# Patient Record
Sex: Female | Born: 1985 | Race: Black or African American | Hispanic: No | Marital: Single | Smoking: Former smoker
Health system: Southern US, Community
[De-identification: ages and names within clinical notes are randomized; demographics above are authoritative.]

## PROBLEM LIST (undated history)

## (undated) ENCOUNTER — Emergency Department (HOSPITAL_COMMUNITY): Payer: Medicaid Other

## (undated) ENCOUNTER — Emergency Department (HOSPITAL_COMMUNITY): Discharge status: Absent without leave | Payer: Self-pay

## (undated) DIAGNOSIS — R519 Headache, unspecified: Secondary | ICD-10-CM

## (undated) DIAGNOSIS — Z923 Personal history of irradiation: Secondary | ICD-10-CM

---
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
## 2007-03-09 ENCOUNTER — Emergency Department (HOSPITAL_COMMUNITY): Admission: EM | Admit: 2007-03-09 | Discharge: 2007-03-09 | Payer: Self-pay | Admitting: Family Medicine

## 2007-04-14 ENCOUNTER — Encounter: Payer: Self-pay | Admitting: Family Medicine

## 2007-04-14 ENCOUNTER — Ambulatory Visit: Payer: Self-pay

## 2007-04-14 LAB — CONVERTED CEMR LAB
Antibody Screen: NEGATIVE
Basophils Relative: 1 % (ref 0–1)
Eosinophils Absolute: 0.2 10*3/uL (ref 0.0–1.2)
Hemoglobin: 11.6 g/dL — ABNORMAL LOW (ref 12.0–16.0)
MCHC: 34.1 g/dL (ref 28.0–37.0)
MCV: 87.9 fL (ref 82.0–98.0)
Monocytes Absolute: 0.6 10*3/uL (ref 0.2–1.2)
Monocytes Relative: 13 % — ABNORMAL HIGH (ref 3–10)
RBC: 3.87 M/uL (ref 3.80–5.70)
RDW: 15.4 % — ABNORMAL HIGH (ref 11.4–14.0)

## 2007-04-19 ENCOUNTER — Encounter: Payer: Self-pay | Admitting: Family Medicine

## 2007-04-19 ENCOUNTER — Ambulatory Visit: Payer: Self-pay | Admitting: Sports Medicine

## 2007-05-05 ENCOUNTER — Ambulatory Visit: Payer: Self-pay | Admitting: Family Medicine

## 2007-05-05 ENCOUNTER — Encounter: Payer: Self-pay | Admitting: Family Medicine

## 2007-05-05 LAB — CONVERTED CEMR LAB
Chlamydia, DNA Probe: POSITIVE — AB
Whiff Test: POSITIVE

## 2007-05-07 ENCOUNTER — Telehealth: Payer: Self-pay | Admitting: *Deleted

## 2007-05-12 ENCOUNTER — Telehealth (INDEPENDENT_AMBULATORY_CARE_PROVIDER_SITE_OTHER): Payer: Self-pay | Admitting: Family Medicine

## 2007-05-15 ENCOUNTER — Encounter: Payer: Self-pay | Admitting: Family Medicine

## 2007-05-16 ENCOUNTER — Ambulatory Visit (HOSPITAL_COMMUNITY): Admission: RE | Admit: 2007-05-16 | Discharge: 2007-05-16 | Payer: Self-pay | Admitting: Family Medicine

## 2007-05-29 ENCOUNTER — Encounter: Payer: Self-pay | Admitting: Family Medicine

## 2007-06-02 ENCOUNTER — Encounter: Payer: Self-pay | Admitting: Family Medicine

## 2007-06-02 ENCOUNTER — Encounter: Payer: Self-pay | Admitting: *Deleted

## 2007-06-07 ENCOUNTER — Telehealth (INDEPENDENT_AMBULATORY_CARE_PROVIDER_SITE_OTHER): Payer: Self-pay | Admitting: *Deleted

## 2007-06-07 ENCOUNTER — Encounter: Payer: Self-pay | Admitting: Family Medicine

## 2007-06-07 ENCOUNTER — Ambulatory Visit: Payer: Self-pay | Admitting: Family Medicine

## 2007-06-07 DIAGNOSIS — A5609 Other chlamydial infection of lower genitourinary tract: Secondary | ICD-10-CM | POA: Insufficient documentation

## 2007-06-21 ENCOUNTER — Encounter: Payer: Self-pay | Admitting: *Deleted

## 2007-06-26 ENCOUNTER — Encounter (INDEPENDENT_AMBULATORY_CARE_PROVIDER_SITE_OTHER): Payer: Self-pay | Admitting: *Deleted

## 2007-07-06 ENCOUNTER — Ambulatory Visit: Payer: Self-pay | Admitting: Family Medicine

## 2007-07-18 ENCOUNTER — Ambulatory Visit: Payer: Self-pay | Admitting: Family Medicine

## 2007-07-19 ENCOUNTER — Ambulatory Visit (HOSPITAL_COMMUNITY): Admission: RE | Admit: 2007-07-19 | Discharge: 2007-07-19 | Payer: Self-pay | Admitting: Family Medicine

## 2007-07-19 ENCOUNTER — Encounter: Payer: Self-pay | Admitting: Family Medicine

## 2007-07-24 ENCOUNTER — Encounter: Payer: Self-pay | Admitting: Family Medicine

## 2007-08-01 ENCOUNTER — Ambulatory Visit: Payer: Self-pay | Admitting: Family Medicine

## 2007-08-08 ENCOUNTER — Telehealth (INDEPENDENT_AMBULATORY_CARE_PROVIDER_SITE_OTHER): Payer: Self-pay | Admitting: *Deleted

## 2007-08-11 ENCOUNTER — Encounter: Payer: Self-pay | Admitting: *Deleted

## 2007-08-25 ENCOUNTER — Encounter: Payer: Self-pay | Admitting: *Deleted

## 2007-08-30 ENCOUNTER — Encounter: Payer: Self-pay | Admitting: Family Medicine

## 2007-08-30 ENCOUNTER — Ambulatory Visit: Payer: Self-pay | Admitting: Family Medicine

## 2007-08-31 ENCOUNTER — Encounter: Payer: Self-pay | Admitting: Family Medicine

## 2007-09-01 ENCOUNTER — Telehealth (INDEPENDENT_AMBULATORY_CARE_PROVIDER_SITE_OTHER): Payer: Self-pay | Admitting: *Deleted

## 2007-09-01 ENCOUNTER — Ambulatory Visit: Payer: Self-pay | Admitting: Family Medicine

## 2007-09-06 ENCOUNTER — Ambulatory Visit: Payer: Self-pay | Admitting: Family Medicine

## 2007-09-13 ENCOUNTER — Encounter: Payer: Self-pay | Admitting: *Deleted

## 2007-09-19 ENCOUNTER — Telehealth: Payer: Self-pay | Admitting: Family Medicine

## 2007-09-19 ENCOUNTER — Encounter (INDEPENDENT_AMBULATORY_CARE_PROVIDER_SITE_OTHER): Payer: Self-pay | Admitting: *Deleted

## 2007-09-20 ENCOUNTER — Ambulatory Visit: Payer: Self-pay | Admitting: Family Medicine

## 2007-09-20 ENCOUNTER — Encounter: Payer: Self-pay | Admitting: Family Medicine

## 2007-09-21 ENCOUNTER — Encounter: Payer: Self-pay | Admitting: *Deleted

## 2007-09-26 ENCOUNTER — Inpatient Hospital Stay (HOSPITAL_COMMUNITY): Admission: AD | Admit: 2007-09-26 | Discharge: 2007-09-30 | Payer: Self-pay | Admitting: Obstetrics & Gynecology

## 2007-09-26 ENCOUNTER — Ambulatory Visit: Payer: Self-pay | Admitting: Obstetrics & Gynecology

## 2007-09-26 ENCOUNTER — Encounter: Payer: Self-pay | Admitting: *Deleted

## 2007-09-27 ENCOUNTER — Encounter: Payer: Self-pay | Admitting: Obstetrics & Gynecology

## 2007-11-27 ENCOUNTER — Ambulatory Visit: Payer: Self-pay | Admitting: Sports Medicine

## 2007-11-27 ENCOUNTER — Encounter (INDEPENDENT_AMBULATORY_CARE_PROVIDER_SITE_OTHER): Payer: Self-pay | Admitting: *Deleted

## 2007-11-27 DIAGNOSIS — Z720 Tobacco use: Secondary | ICD-10-CM | POA: Insufficient documentation

## 2007-11-27 DIAGNOSIS — R8789 Other abnormal findings in specimens from female genital organs: Secondary | ICD-10-CM

## 2007-11-27 LAB — CONVERTED CEMR LAB
GC Probe Amp, Genital: NEGATIVE
HCT: 35.5 % — ABNORMAL LOW (ref 36.0–46.0)
Hemoglobin: 11.8 g/dL — ABNORMAL LOW (ref 12.0–15.0)
Platelets: 330 10*3/uL (ref 150–400)
RBC: 4.34 M/uL (ref 3.87–5.11)
WBC: 4.6 10*3/uL (ref 4.0–10.5)

## 2007-12-01 ENCOUNTER — Encounter (INDEPENDENT_AMBULATORY_CARE_PROVIDER_SITE_OTHER): Payer: Self-pay | Admitting: *Deleted

## 2007-12-26 ENCOUNTER — Telehealth (INDEPENDENT_AMBULATORY_CARE_PROVIDER_SITE_OTHER): Payer: Self-pay | Admitting: *Deleted

## 2007-12-27 ENCOUNTER — Encounter: Payer: Self-pay | Admitting: *Deleted

## 2008-02-13 ENCOUNTER — Encounter: Payer: Self-pay | Admitting: Family Medicine

## 2008-03-13 ENCOUNTER — Ambulatory Visit: Payer: Self-pay | Admitting: Family Medicine

## 2008-03-13 ENCOUNTER — Encounter: Payer: Self-pay | Admitting: *Deleted

## 2008-04-08 ENCOUNTER — Encounter (INDEPENDENT_AMBULATORY_CARE_PROVIDER_SITE_OTHER): Payer: Self-pay | Admitting: *Deleted

## 2008-04-10 ENCOUNTER — Encounter: Payer: Self-pay | Admitting: Family Medicine

## 2008-05-02 ENCOUNTER — Encounter: Payer: Self-pay | Admitting: *Deleted

## 2008-05-20 ENCOUNTER — Telehealth: Payer: Self-pay | Admitting: *Deleted

## 2008-05-24 ENCOUNTER — Ambulatory Visit: Payer: Self-pay | Admitting: Family Medicine

## 2008-05-24 ENCOUNTER — Encounter: Payer: Self-pay | Admitting: Family Medicine

## 2008-05-24 LAB — CONVERTED CEMR LAB: Whiff Test: NEGATIVE

## 2008-05-27 ENCOUNTER — Encounter: Payer: Self-pay | Admitting: Family Medicine

## 2008-05-28 ENCOUNTER — Encounter: Payer: Self-pay | Admitting: Family Medicine

## 2008-05-31 ENCOUNTER — Telehealth: Payer: Self-pay | Admitting: *Deleted

## 2008-06-03 ENCOUNTER — Ambulatory Visit: Payer: Self-pay | Admitting: Family Medicine

## 2008-06-17 ENCOUNTER — Emergency Department (HOSPITAL_COMMUNITY): Admission: EM | Admit: 2008-06-17 | Discharge: 2008-06-18 | Payer: Self-pay | Admitting: Emergency Medicine

## 2008-08-05 ENCOUNTER — Encounter: Payer: Self-pay | Admitting: *Deleted

## 2008-08-06 ENCOUNTER — Ambulatory Visit: Payer: Self-pay | Admitting: Family Medicine

## 2008-08-06 ENCOUNTER — Encounter (INDEPENDENT_AMBULATORY_CARE_PROVIDER_SITE_OTHER): Payer: Self-pay | Admitting: Family Medicine

## 2008-08-06 LAB — CONVERTED CEMR LAB
Blood in Urine, dipstick: NEGATIVE
Glucose, Urine, Semiquant: NEGATIVE
Protein, U semiquant: NEGATIVE
pH: 6

## 2008-08-12 ENCOUNTER — Encounter (INDEPENDENT_AMBULATORY_CARE_PROVIDER_SITE_OTHER): Payer: Self-pay | Admitting: *Deleted

## 2008-08-12 LAB — CONVERTED CEMR LAB

## 2008-08-20 ENCOUNTER — Ambulatory Visit: Payer: Self-pay | Admitting: Family Medicine

## 2008-12-31 ENCOUNTER — Ambulatory Visit: Payer: Self-pay | Admitting: Family Medicine

## 2008-12-31 ENCOUNTER — Encounter: Payer: Self-pay | Admitting: Family Medicine

## 2008-12-31 LAB — CONVERTED CEMR LAB: Beta hcg, urine, semiquantitative: NEGATIVE

## 2009-01-01 ENCOUNTER — Encounter (INDEPENDENT_AMBULATORY_CARE_PROVIDER_SITE_OTHER): Payer: Self-pay | Admitting: *Deleted

## 2009-01-01 LAB — CONVERTED CEMR LAB: Chlamydia, DNA Probe: NEGATIVE

## 2009-01-03 ENCOUNTER — Ambulatory Visit: Payer: Self-pay | Admitting: Family Medicine

## 2009-01-03 DIAGNOSIS — A5400 Gonococcal infection of lower genitourinary tract, unspecified: Secondary | ICD-10-CM | POA: Insufficient documentation

## 2009-02-24 ENCOUNTER — Telehealth: Payer: Self-pay | Admitting: Family Medicine

## 2009-02-25 ENCOUNTER — Encounter: Payer: Self-pay | Admitting: Family Medicine

## 2009-02-25 ENCOUNTER — Encounter: Payer: Self-pay | Admitting: *Deleted

## 2009-02-25 ENCOUNTER — Ambulatory Visit: Payer: Self-pay | Admitting: Family Medicine

## 2009-02-25 LAB — CONVERTED CEMR LAB
Beta hcg, urine, semiquantitative: POSITIVE
Lymphocytes Relative: 32 % (ref 12–46)
Lymphs Abs: 1.5 10*3/uL (ref 0.7–4.0)
Neutro Abs: 2.7 10*3/uL (ref 1.7–7.7)
Neutrophils Relative %: 58 % (ref 43–77)
Platelets: 240 10*3/uL (ref 150–400)
Rubella: 63.7 intl units/mL — ABNORMAL HIGH
WBC: 4.7 10*3/uL (ref 4.0–10.5)

## 2009-02-26 ENCOUNTER — Encounter: Payer: Self-pay | Admitting: Family Medicine

## 2009-02-27 ENCOUNTER — Encounter: Payer: Self-pay | Admitting: Family Medicine

## 2009-02-27 ENCOUNTER — Ambulatory Visit (HOSPITAL_COMMUNITY): Admission: RE | Admit: 2009-02-27 | Discharge: 2009-02-27 | Payer: Self-pay | Admitting: Family Medicine

## 2009-03-03 ENCOUNTER — Encounter: Payer: Self-pay | Admitting: Family Medicine

## 2009-03-03 ENCOUNTER — Ambulatory Visit: Payer: Self-pay | Admitting: Family Medicine

## 2009-03-03 ENCOUNTER — Telehealth: Payer: Self-pay | Admitting: *Deleted

## 2009-03-13 ENCOUNTER — Telehealth: Payer: Self-pay | Admitting: Family Medicine

## 2009-03-13 ENCOUNTER — Encounter: Payer: Self-pay | Admitting: Family Medicine

## 2009-03-13 ENCOUNTER — Ambulatory Visit: Payer: Self-pay | Admitting: Family Medicine

## 2009-04-02 ENCOUNTER — Inpatient Hospital Stay (HOSPITAL_COMMUNITY): Admission: AD | Admit: 2009-04-02 | Discharge: 2009-04-02 | Payer: Self-pay | Admitting: Obstetrics and Gynecology

## 2009-04-09 ENCOUNTER — Encounter: Payer: Self-pay | Admitting: Family Medicine

## 2009-04-14 ENCOUNTER — Encounter: Payer: Self-pay | Admitting: Family Medicine

## 2009-04-14 ENCOUNTER — Ambulatory Visit: Payer: Self-pay | Admitting: Family Medicine

## 2009-04-18 LAB — CONVERTED CEMR LAB
Chlamydia, DNA Probe: NEGATIVE
GC Probe Amp, Genital: NEGATIVE

## 2009-04-24 ENCOUNTER — Inpatient Hospital Stay (HOSPITAL_COMMUNITY): Admission: AD | Admit: 2009-04-24 | Discharge: 2009-04-27 | Payer: Self-pay | Admitting: Obstetrics & Gynecology

## 2009-04-24 ENCOUNTER — Ambulatory Visit: Payer: Self-pay | Admitting: Obstetrics & Gynecology

## 2009-06-13 ENCOUNTER — Encounter: Payer: Self-pay | Admitting: Family Medicine

## 2009-06-13 ENCOUNTER — Ambulatory Visit: Payer: Self-pay | Admitting: Family Medicine

## 2009-06-13 ENCOUNTER — Encounter: Payer: Self-pay | Admitting: *Deleted

## 2009-06-13 DIAGNOSIS — N898 Other specified noninflammatory disorders of vagina: Secondary | ICD-10-CM | POA: Insufficient documentation

## 2009-06-18 LAB — CONVERTED CEMR LAB: GC Probe Amp, Genital: NEGATIVE

## 2009-07-15 ENCOUNTER — Ambulatory Visit: Payer: Self-pay | Admitting: Family Medicine

## 2009-10-22 ENCOUNTER — Ambulatory Visit: Payer: Self-pay | Admitting: Family Medicine

## 2009-10-22 LAB — CONVERTED CEMR LAB: Beta hcg, urine, semiquantitative: NEGATIVE

## 2009-10-26 ENCOUNTER — Emergency Department (HOSPITAL_COMMUNITY): Admission: EM | Admit: 2009-10-26 | Discharge: 2009-10-26 | Payer: Self-pay | Admitting: Emergency Medicine

## 2010-05-27 ENCOUNTER — Telehealth: Payer: Self-pay | Admitting: Family Medicine

## 2010-08-03 ENCOUNTER — Encounter: Payer: Self-pay | Admitting: Family Medicine

## 2010-08-03 ENCOUNTER — Encounter: Payer: Self-pay | Admitting: *Deleted

## 2010-11-09 ENCOUNTER — Ambulatory Visit: Admit: 2010-11-09 | Payer: Self-pay

## 2010-11-15 LAB — CONVERTED CEMR LAB: GC Probe Amp, Genital: POSITIVE — AB

## 2010-11-17 NOTE — Assessment & Plan Note (Signed)
Summary: pregnancy test/depo,tcb  Nurse Visit   Allergies: No Known Drug Allergies Laboratory Results   Urine Tests  Date/Time Received: October 22, 2009 9:36 AM  Date/Time Reported: October 22, 2009 9:40 AM     Urine HCG: negative Comments: ...........test performed by...........Marland KitchenTerese Door, CMA      Medication Administration  Injection # 1:    Medication: Depo-Provera 150mg     Diagnosis: CONTRACEPTIVE MANAGEMENT (ICD-V25.09)    Route: IM    Site: L thigh    Exp Date: 11/2010    Lot #: [id number]    Mfr: Pharmacia    Comments: next Depo due March 23 thru April 6 , 2011    Patient tolerated injection without complications    Given by: Theresia Lo RN (October 22, 2009 10:15 AM)  Orders Added: 1)  U Preg-[facility] [81025] 2)  Depo-Provera 150mg  [J1055] 3)  Admin of Injection (IM/SQ) [04540]    Medication Administration  Injection # 1:    Medication: Depo-Provera 150mg     Diagnosis: CONTRACEPTIVE MANAGEMENT (ICD-V25.09)    Route: IM    Site: L thigh    Exp Date: 11/2010    Lot #: [id number]    Mfr: Pharmacia    Comments: next Depo due March 23 thru April 6 , 2011    Patient tolerated injection without complications    Given by: Theresia Lo RN (October 22, 2009 10:15 AM)  Orders Added: 1)  U Preg-[facility] [81025] 2)  Depo-Provera 150mg  [J1055] 3)  Admin of Injection (IM/SQ) [47829]

## 2010-11-17 NOTE — Letter (Signed)
Summary: Probation Letter  [facility]  [address]   [place], [place] [address]   Phone: [phone]  Fax: [phone]    08/03/2010  Dexter L Baltes [address] [place], [place]  [address]  Dear Ms. Motl,  With the goal of better serving all our patients [facility] is following each patient's missed appointments.  You have missed at least 3 appointments with our practice.If you cannot keep your appointment, we expect you to call at least 24 hours before your appointment time.  Missing appointments prevents other patients from seeing us and makes it difficult to provide you with the best possible medical care.      1.   If you miss one more appointment, we will only give you limited medical services. This means we will not call in medication refills, complete a form, or make a referral for you except when you are here for a scheduled office visit.    2.   If you miss 2 or more appointments in the next year, we will dismiss you from our practice.    Our office staff can be reached at [phone] Monday through Friday from 8:30 a.m.-5:00 p.m. and will be glad to schedule your appointment as necessary.    Thank you.   [facility] 

## 2010-11-17 NOTE — Progress Notes (Signed)
Summary: resch  Phone Note Call from Patient   Caller: Patient Summary of Call: just found she had to work this afternoon and had to resch Initial call taken by: De Nurse,  May 27, 2010 12:21 PM

## 2010-11-17 NOTE — Letter (Signed)
Summary: Probation Letter  [facility]  [address]   [place], [place] [address]   Phone: [phone]  Fax: [phone]    08/03/2010  Virginia Cardenas [address] [place], [place]  [address]  Dear [name],  With the goal of better serving all our patients the [facility] is following each patient's missed appointments.  You have missed at least 3 appointments with our practice.If you cannot keep your appointment, we expect you to call at least 24 hours before your appointment time.  Missing appointments prevents other patients from seeing [place] and makes it difficult to provide you with the best possible medical care.      1.   If you miss one more appointment, we will only give you limited medical services. This means we will not call in medication refills, complete a form, or make a referral for you except when you are here for a scheduled office visit.    2.   If you miss 2 or more appointments in the next year, we will dismiss you from our practice.    Our office staff can be reached at [phone] Monday through Friday from 8:30 a.m.-5:00 p.m. and will be glad to schedule your appointment as necessary.    Thank you.   [facility]

## 2011-01-24 LAB — RAPID URINE DRUG SCREEN, HOSP PERFORMED
Amphetamines: NOT DETECTED
Barbiturates: NOT DETECTED
Tetrahydrocannabinol: NOT DETECTED

## 2011-01-24 LAB — CBC
HCT: 29.3 % — ABNORMAL LOW (ref 36.0–46.0)
Hemoglobin: 10.7 g/dL — ABNORMAL LOW (ref 12.0–15.0)
MCV: 84.5 fL (ref 78.0–100.0)
Platelets: 210 10*3/uL (ref 150–400)
RBC: 3.78 MIL/uL — ABNORMAL LOW (ref 3.87–5.11)
RDW: 17.6 % — ABNORMAL HIGH (ref 11.5–15.5)
WBC: 6 10*3/uL (ref 4.0–10.5)
WBC: 8.3 10*3/uL (ref 4.0–10.5)

## 2011-01-24 LAB — RPR: RPR Ser Ql: NONREACTIVE

## 2011-01-26 LAB — POCT URINALYSIS DIP (DEVICE)
Glucose, UA: NEGATIVE mg/dL
Ketones, ur: NEGATIVE mg/dL
Protein, ur: 30 mg/dL — AB
Specific Gravity, Urine: 1.015 (ref 1.005–1.030)
Urobilinogen, UA: 0.2 mg/dL (ref 0.0–1.0)

## 2011-02-09 ENCOUNTER — Ambulatory Visit (INDEPENDENT_AMBULATORY_CARE_PROVIDER_SITE_OTHER): Payer: Self-pay | Admitting: Family Medicine

## 2011-02-09 VITALS — BP 107/70 | HR 70 | Temp 98.2°F | Ht 65.0 in | Wt 174.0 lb

## 2011-02-09 DIAGNOSIS — B86 Scabies: Secondary | ICD-10-CM | POA: Insufficient documentation

## 2011-02-09 DIAGNOSIS — R21 Rash and other nonspecific skin eruption: Secondary | ICD-10-CM

## 2011-02-09 MED ORDER — PERMETHRIN 5 % EX CREA: TOPICAL_CREAM | CUTANEOUS | Status: AC

## 2011-02-09 MED ORDER — HYDROCORTISONE 2.5 % EX CREA: TOPICAL_CREAM | CUTANEOUS | Status: AC

## 2011-02-09 NOTE — Assessment & Plan Note (Addendum)
Consistent with scabies given timeline and distribution/morphology of rash. Overall case discussed with Dr. Wallene Huh as pt's son also being seen for rash. Will treat pt as well as family with permethrin. Scabies handout given to pt. Stressed linen cleaning and d/c co-sleeping.  Will also cover for eczema with topical hydrocortisone. Pt to follow up with PCP in 1-2 weeks for follow up.

## 2011-02-09 NOTE — Progress Notes (Signed)
  Subjective:    Patient ID: Virginia Cardenas, female    DOB: [birth year], 25 y.o.   MRN: [id number]  HPI Pt Upper body rash x 2 days. Pt reports onset of rash after son and daughter spent weekend with grandmother. During that time son and daughter developed similar rash at relatively same onset per pt. Mom reports son seen in ED and treated for unspecified infection. Rash developed on pt after son and daughter came home. Pt and children sleep in same bed. Rash initially on hands, then progressed to forearm, chest upper torso. Rash itchy in nature. No fevers, HA, nausea, vomiting. No recent detergent changes. No noew medications per pt. Past history of eczema per pt. Distribution somewhat atypical from previous presentation.    Review of Systems See HPI    Objective:   Physical Exam  Skin:      Gen- in chair, NAD Skin- + papular rash/burrow on volar surface of hands, forearms bilaterally. + burrows along bra line and ant/post neck.    Assessment & Plan:  Rash- Consistent with scabies given timeline and distribution/morphology of rash. Overall case discussed with Dr. Wallene Huh as pt's son also being seen for rash. Will treat pt as well as family with permethrin. Scabies handout given to pt. Stressed linen cleaning and d/c co-sleeping.  Will also cover for eczema with topical hydrocortisone. Pt to follow up with PCP in 1-2 weeks for follow up.

## 2011-02-09 NOTE — Patient Instructions (Signed)
Scabies   Scabies are small bugs (mites) that burrow under the skin and cause red bumps and severe itching. These bugs can only be seen with a microscope.   Scabies are highly contagious. They can spread easily from person to person by direct contact. They are also spread through sharing clothing or linens that have the scabies mites living in them. It is not unusual for an entire family to become infected through shared towels, clothing, or bedding.   HOME CARE INSTRUCTIONS   Your caregiver may prescribe a cream or lotion to kill the mites. If this cream is prescribed; massage the cream into the entire area of the body from the neck to the bottom of both feet. Also massage the cream into the scalp and face if your child is less than 1 year old. Avoid the eyes and mouth.   Leave the cream on for 8 to12 hours. DO NOT wash your hands after application. Your child should bathe or shower after the 8 to 12 hour application period. Sometimes it is helpful to apply the cream to your child at right before bedtime.   One treatment is usually effective and will eliminate approximately 95% of infestations. For severe cases, your caregiver may decide to repeat the treatment in 1 week. Everyone in your household should be treated with one application of the cream.   New rashes or burrows should not appear after successful treatment within 24 to 48 hours; however the itching and rash may last for 2 to 4 weeks after successful treatment. If your symptoms persist longer than this, see your caregiver.   Your caregiver also may prescribe a medication to help with the itching or to help the rash go away more quickly.   Scabies can live on clothing or linens for up to 3 days. Your entire child’s recently used clothing, towels, stuffed toys, and bed linens should be washed in hot water and then dried in a dryer for at least 20 minutes on high heat. Items that cannot be washed should be enclosed in a plastic bag for at least 3 days.   To  help relieve itching, bathe your child in a COOL bath or apply cool washcloths to the affected areas.   Your child may return to school after treatment with the prescribed cream.   SEEK MEDICAL CARE IF:   The itching persists longer than 4 weeks after treatment.   The rash spreads or becomes infected (the area has red blisters or yellow-tan crust).   Document Released: 10/04/2005 Document Re-Released: 02/20/2009   Exit[employer]® Patient Information ©2011 [employer].

## 2011-03-02 NOTE — Op Note (Signed)
Virginia Cardenas, Virginia Cardenas            ACCOUNT NO.:  [id number]   MEDICAL RECORD NO.:  [id number]          PATIENT TYPE:  INP   LOCATION:                                [facility]:  [facility]   PHYSICIAN:  Lesly Dukes, M.D. DATE OF BIRTH:  [birth year]   DATE OF PROCEDURE:  09/27/2007  DATE OF DISCHARGE:                               OPERATIVE REPORT   PREOPERATIVE DIAGNOSIS:  1. Intrauterine pregnancy at 40 weeks 1 day gestation.  2. Nonreassuring fetal heart tones.  3. Oligohydramnios.  4. Occipit posterior position.   POSTOPERATIVE DIAGNOSIS:  1. Intrauterine pregnancy at 40 weeks 1 day gestation.  2. Nonreassuring fetal heart tones.  3. Oligohydramnios.  4. Occipit posterior position.   PROCEDURE:  Primary low transverse cesarean section via Pfannenstiel  skin incision.   SURGEON:  Lesly Dukes, M.D.   ASSISTANT:  Karlton Lemon, M.D.   ANESTHESIA:  Epidural.   FINDINGS:  1. Viable infant female weighing 5 pounds 15 ounces with Apgars 8 at      one minute and 9 at five minutes, and cord pH of 7.35.  Nuchal cord      x1 in occipitoposterior position.  2. Clear amniotic fluid.  3. Normal female pelvic anatomy with decidual changes of pregnancy.   ESTIMATED BLOOD LOSS:  700 mL.   DRAINS:  Foley with 125 mL clear yellow urine.   COMPLICATIONS:  None immediate.   SPECIMENS:  Placenta to pathology, cord gas to the lab.   INDICATIONS FOR PROCEDURE:  This is a 25 year old gravida 1 at 40 weeks  1 day gestation with induction of labor for oligohydramnios with an AFI  of 5.4.  The patient was induced initially with Cervidil and then a  Foley bulb was placed and Pitocin was started.  The patient progressed  to anterior lip at which time the fetal heart tones dropped to the 80s  and remained there for approximately 4-5 minutes.  At this time it was  determined that the baby would not tolerate labor and the decision was  made to go for a stat primary low transverse cesarean  section.   DESCRIPTION OF PROCEDURE:  The patient was taken to the operating room  and was prepped and draped in the usual sterile fashion in the supine  position with left lateral uterine displacement.  After ensuring  adequate epidural anesthesia, a Pfannenstiel skin incision was made  using the scalpel.  The incision was carried down through subcutaneous  tissues using the scalpel.  The rectus fascia was nicked in the midline  and the incision was extended laterally in each direction using the Mayo  scissors.  The rectus muscle was dissected free of the fascia using both  sharp and blunt dissection.  The rectus muscles were separated bluntly.  The parietoperitoneum was identified, grasped with a Hemostat, elevated,  and entered inferiorly under direct visualization with blunt dissection.  The peritoneum was then dissected with Metzenbaum scissors to allow  adequate room for delivery.  The bladder blade was then placed.  Low  transverse uterine incision was made using the scalpel and the  incision  was extended laterally and superior using blunt dissection.  The infant  was found to be in the occipitoposterior position with deflexed head.  The hand was placed within the uterine cavity and used to elevate and  flex the head of the infant which was delivered without difficulty.  The  infant was bulb suctioned after delivery of the head.  The body of the  infant was then delivered without difficulty.  The cord was doubly  clamped and cut and the infant handed to the nursery team in attendance.  Specimens were collected for cord gas.  The placenta was delivered  manually and appeared intact.  The endometrial cavity was then wiped  free of any traces of membranes using wet laparotomy sponges.  The edges  of the uterine incision were grasped using Allis clamps and the uterine  incision was closed in one layer with a running locked stitch of 0  Vicryl.  One small area of bleeding was controlled  using a figure-of-  eight stitch of 0 Vicryl.  The uterus was inspected and found to have  adequate hemostasis.  The abdominal cavity and operative site were then  irrigated with normal saline.  The incision was again inspected after  irrigation and found to be hemostatic.  The rectus muscles were  inspected and small areas of bleeding controlled using the Bovie  cautery.  The fascia was reapproximated with one suture of 0 Vicryl in a  running unlocked fashion.  Subcutaneous tissue was then irrigated with a  wet laparotomy sponge.  The skin was then reapproximated with stainless  steel skin staples.  Needle, sponge, and instrument counts correct x2.  The patient tolerated the procedure well and went to the postanesthesia  care unit in stable condition.      Karlton Lemon, MD  Electronically Signed     ______________________________  Lesly Dukes, M.D.    NS/MEDQ  D:  09/27/2007  T:  09/28/2007  Job:  [id number]

## 2011-03-02 NOTE — Discharge Summary (Signed)
Virginia Cardenas, Virginia Cardenas            ACCOUNT NO.:  [id number]   MEDICAL RECORD NO.:  [id number]          PATIENT TYPE:  INP   LOCATION:  9104                          FACILITY:  [facility]   PHYSICIAN:  Lesly Dukes, M.D. DATE OF BIRTH:  December 03, 1985   DATE OF ADMISSION:  09/26/2007  DATE OF DISCHARGE:  09/30/2007                               DISCHARGE SUMMARY   DISCHARGE DIAGNOSIS:  Term pregnancy delivered via low transverse  cesarean section.   DISCHARGE MEDICATIONS:  1. Prenatal vitamins one p.o. daily.  2. Iron 325 mg p.o. b.i.d.  3. Colace 100 mg p.o. daily.  4. Percocet 5/325 mg one p.o. q.4 hours p.r.n. pain.   HOSPITAL COURSE:  The patient is a 25 year old G1, P0 who was admitted  on September 26, 2007 for induction of labor due to decreased amniotic  fluid found in clinic. Patient was admitted for induction of labor and  Cervidil was placed.  Following her Cervidil, patient had a Foley bulb  placed in order to dilate her cervix. Once the Foley bulb fell out it  started to augment labor.  Patient had artificial rupture of membranes  at 9:55 on September 27, 2007 at which time an IUPC and fetal scalp  electrode was placed.  Following AROM the patient had early  decelerations and an amnio-infusion was started. Subsequently patient  had fetal bradycardia to the 80's and 90's for 6 minutes.  She was taken  for stat primary cesarean section due to non reassuring fetal heart  tones.  The patient was consented and taken to the operating room where  the procedure was performed.  Please see operative report for details.   Postpartum patient had no complications.  Staples were removed on  postoperative day 3 prior to discharge and patient received Depo for  contraception prior to going home.  Patient delivered a viable, healthy  female infant with Agar's of 8 and 9.   Patient with follow up at [facility] in 6 weeks.  She  was discharged home in stable condition.      Neena Rhymes, M.D.      Lesly Dukes, M.D.     KT/MEDQ  D:  09/30/2007  T:  10/01/2007  Job:  [id number]

## 2011-06-01 ENCOUNTER — Emergency Department (HOSPITAL_COMMUNITY): Payer: Self-pay

## 2011-06-01 ENCOUNTER — Encounter (HOSPITAL_COMMUNITY): Payer: Self-pay | Admitting: Radiology

## 2011-06-01 ENCOUNTER — Emergency Department (HOSPITAL_COMMUNITY)
Admission: EM | Admit: 2011-06-01 | Discharge: 2011-06-01 | Disposition: A | Discharge status: Home / Self Care | Payer: No Typology Code available for payment source | Attending: Emergency Medicine | Admitting: Emergency Medicine

## 2011-06-01 DIAGNOSIS — R109 Unspecified abdominal pain: Secondary | ICD-10-CM | POA: Insufficient documentation

## 2011-06-01 DIAGNOSIS — S139XXA Sprain of joints and ligaments of unspecified parts of neck, initial encounter: Secondary | ICD-10-CM | POA: Insufficient documentation

## 2011-06-01 DIAGNOSIS — S0990XA Unspecified injury of head, initial encounter: Secondary | ICD-10-CM | POA: Insufficient documentation

## 2011-06-01 DIAGNOSIS — R079 Chest pain, unspecified: Secondary | ICD-10-CM | POA: Insufficient documentation

## 2011-06-01 DIAGNOSIS — S301XXA Contusion of abdominal wall, initial encounter: Secondary | ICD-10-CM | POA: Insufficient documentation

## 2011-06-01 DIAGNOSIS — R10812 Left upper quadrant abdominal tenderness: Secondary | ICD-10-CM | POA: Insufficient documentation

## 2011-06-01 DIAGNOSIS — R51 Headache: Secondary | ICD-10-CM | POA: Insufficient documentation

## 2011-06-01 DIAGNOSIS — M542 Cervicalgia: Secondary | ICD-10-CM | POA: Insufficient documentation

## 2011-06-01 LAB — DIFFERENTIAL
Basophils Absolute: 0 10*3/uL (ref 0.0–0.1)
Eosinophils Absolute: 0.1 10*3/uL (ref 0.0–0.7)
Lymphocytes Relative: 40 % (ref 12–46)
Monocytes Relative: 10 % (ref 3–12)
Neutrophils Relative %: 47 % (ref 43–77)

## 2011-06-01 LAB — CBC
Hemoglobin: 12.9 g/dL (ref 12.0–15.0)
Platelets: ADEQUATE 10*3/uL (ref 150–400)
RBC: 4.31 MIL/uL (ref 3.87–5.11)
WBC: 4.5 10*3/uL (ref 4.0–10.5)

## 2011-06-01 LAB — POCT I-STAT, CHEM 8
Hemoglobin: 13.6 g/dL (ref 12.0–15.0)
Potassium: 4 mEq/L (ref 3.5–5.1)
Sodium: 140 mEq/L (ref 135–145)
TCO2: 26 mmol/L (ref 0–100)

## 2011-06-01 MED ORDER — IOHEXOL 300 MG/ML  SOLN
100.0000 mL | Freq: Once | INTRAMUSCULAR | Status: AC | PRN
  Administered 2011-06-01: 100 mL via INTRAVENOUS

## 2011-06-03 ENCOUNTER — Emergency Department (HOSPITAL_COMMUNITY)
Admission: EM | Admit: 2011-06-03 | Discharge: 2011-06-03 | Disposition: A | Discharge status: Home / Self Care | Payer: No Typology Code available for payment source | Attending: Emergency Medicine | Admitting: Emergency Medicine

## 2011-06-03 DIAGNOSIS — M546 Pain in thoracic spine: Secondary | ICD-10-CM | POA: Insufficient documentation

## 2011-06-03 DIAGNOSIS — M545 Low back pain, unspecified: Secondary | ICD-10-CM | POA: Insufficient documentation

## 2011-06-03 DIAGNOSIS — R51 Headache: Secondary | ICD-10-CM | POA: Insufficient documentation

## 2011-06-03 DIAGNOSIS — S139XXA Sprain of joints and ligaments of unspecified parts of neck, initial encounter: Secondary | ICD-10-CM | POA: Insufficient documentation

## 2011-06-04 ENCOUNTER — Ambulatory Visit: Payer: No Typology Code available for payment source | Admitting: Family Medicine

## 2011-07-21 LAB — HEPATIC FUNCTION PANEL
ALT: 20
AST: 26
Albumin: 3.9
Total Protein: 7.9

## 2011-07-26 LAB — CBC
HCT: 26.9 — ABNORMAL LOW
HCT: 34.9 — ABNORMAL LOW
Hemoglobin: 11.9 — ABNORMAL LOW
MCHC: 34
MCHC: 34.5
MCV: 87.4
MCV: 87.4
Platelets: 222
RBC: 4
WBC: 12.3 — ABNORMAL HIGH

## 2011-07-29 ENCOUNTER — Ambulatory Visit: Payer: No Typology Code available for payment source | Admitting: Family Medicine

## 2011-09-15 ENCOUNTER — Encounter: Payer: Self-pay | Admitting: Family Medicine

## 2011-09-15 ENCOUNTER — Ambulatory Visit (INDEPENDENT_AMBULATORY_CARE_PROVIDER_SITE_OTHER): Payer: No Typology Code available for payment source | Admitting: Family Medicine

## 2011-09-15 DIAGNOSIS — M545 Low back pain, unspecified: Secondary | ICD-10-CM | POA: Insufficient documentation

## 2011-09-15 DIAGNOSIS — Z309 Encounter for contraceptive management, unspecified: Secondary | ICD-10-CM | POA: Insufficient documentation

## 2011-09-15 DIAGNOSIS — N898 Other specified noninflammatory disorders of vagina: Secondary | ICD-10-CM

## 2011-09-15 LAB — POCT WET PREP (WET MOUNT): WBC, Wet Prep HPF POC: >20

## 2011-09-15 MED ORDER — AZITHROMYCIN 500 MG PO TABS: 1000.0000 mg | ORAL_TABLET | Freq: Once | ORAL | Status: DC

## 2011-09-15 MED ORDER — INDOMETHACIN ER 75 MG PO CPCR: 75.0000 mg | ORAL_CAPSULE | Freq: Two times a day (BID) | ORAL | Status: AC

## 2011-09-15 MED ORDER — CEFTRIAXONE SODIUM 1 G IJ SOLR
250.0000 mg | Freq: Once | INTRAMUSCULAR | Status: AC
  Administered 2011-09-15: 250 mg via INTRAMUSCULAR

## 2011-09-15 MED ORDER — BACLOFEN 10 MG PO TABS: 10.0000 mg | ORAL_TABLET | Freq: Three times a day (TID) | ORAL | Status: DC

## 2011-09-15 NOTE — Assessment & Plan Note (Signed)
Musculoskeletal in origin. Advised patient to follow chiropractic or with an osteopathic physician here. Indomethacin and baclofen as needed - which were prescribed today. Heat/ice as needed.
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
## 2011-09-15 NOTE — Assessment & Plan Note (Signed)
Due to high risk we'll treat with azithromycin and ceftriaxone. GC and Chlamydia cultures were obtained as well as wet prep.

## 2011-09-15 NOTE — Patient Instructions (Signed)
Take Azithromycin 1000mg  (2 tablets) today. We will call you with the results of the tests done today.   Back Pain, Adult Back pain is very common. The pain often gets better over time. The cause of back pain is usually not dangerous. Most people can learn to manage their back pain on their own.  HOME CARE   Stay active. Start with short walks on flat ground if you can. Try to walk farther each day.   Do not sit, drive, or stand in one place for more than 30 minutes. Do not stay in bed.   Do not avoid exercise or work. Activity can help your back heal faster.   Be careful when you bend or lift an object. Bend at your knees, keep the object close to you, and do not twist.   Sleep on a firm mattress. Lie on your side, and bend your knees. If you lie on your back, put a pillow under your knees.   Only take medicines as told by your doctor.   Put ice on the injured area.   Put ice in a plastic bag.   Place a towel between your skin and the bag.   Leave the ice on for 15 to 20 minutes, 3 to 4 times a day for the first 2 to 3 days. After that, you can switch between ice and heat packs.   Ask your doctor about back exercises or massage.   Avoid feeling anxious or stressed. Find good ways to deal with stress, such as exercise.  GET HELP RIGHT AWAY IF:   Your pain does not go away with rest or medicine.   Your pain does not go away in 1 week.   You have new problems.   You do not feel well.   The pain spreads into your legs.   You cannot control when you poop (bowel movement) or pee (urinate).   Your arms or legs feel weak or lose feeling (numbness).   You feel sick to your stomach (nauseous) or throw up (vomit).   You have belly (abdominal) pain.   You feel like you may pass out (faint).  MAKE SURE YOU:   Understand these instructions.   Will watch your condition.   Will get help right away if you are not doing well or get worse.  Document Released: 03/22/2008  Document Revised: 06/16/2011 Document Reviewed: 02/22/2011 [facility] Patient Information 2012 [place], [place].

## 2011-09-16 ENCOUNTER — Other Ambulatory Visit: Payer: Self-pay | Admitting: Family Medicine

## 2011-09-16 DIAGNOSIS — N760 Acute vaginitis: Secondary | ICD-10-CM

## 2011-09-16 DIAGNOSIS — B373 Candidiasis of vulva and vagina: Secondary | ICD-10-CM

## 2011-09-16 MED ORDER — METRONIDAZOLE 500 MG PO TABS: 500.0000 mg | ORAL_TABLET | Freq: Two times a day (BID) | ORAL | Status: AC

## 2011-09-16 MED ORDER — FLUCONAZOLE 150 MG PO TABS: 150.0000 mg | ORAL_TABLET | Freq: Once | ORAL | Status: AC

## 2012-01-06 ENCOUNTER — Ambulatory Visit: Payer: No Typology Code available for payment source | Admitting: Family Medicine

## 2012-01-13 ENCOUNTER — Ambulatory Visit: Payer: No Typology Code available for payment source | Admitting: Family Medicine

## 2012-04-07 ENCOUNTER — Ambulatory Visit (INDEPENDENT_AMBULATORY_CARE_PROVIDER_SITE_OTHER): Payer: Medicaid Other | Admitting: Family Medicine

## 2012-04-07 ENCOUNTER — Other Ambulatory Visit (HOSPITAL_COMMUNITY)
Admission: RE | Admit: 2012-04-07 | Discharge: 2012-04-07 | Disposition: A | Discharge status: Home / Self Care | Payer: Medicaid Other | Source: Ambulatory Visit | Attending: Family Medicine | Admitting: Family Medicine

## 2012-04-07 VITALS — BP 112/72 | HR 93 | Temp 98.1°F | Ht 65.0 in | Wt 211.0 lb

## 2012-04-07 DIAGNOSIS — M545 Low back pain: Secondary | ICD-10-CM

## 2012-04-07 DIAGNOSIS — Z113 Encounter for screening for infections with a predominantly sexual mode of transmission: Secondary | ICD-10-CM

## 2012-04-07 DIAGNOSIS — L709 Acne, unspecified: Secondary | ICD-10-CM | POA: Insufficient documentation

## 2012-04-07 DIAGNOSIS — L708 Other acne: Secondary | ICD-10-CM

## 2012-04-07 DIAGNOSIS — N760 Acute vaginitis: Secondary | ICD-10-CM

## 2012-04-07 LAB — POCT WET PREP (WET MOUNT)

## 2012-04-07 MED ORDER — CLINDAMYCIN PHOS-BENZOYL PEROX 1-5 % EX GEL: Freq: Two times a day (BID) | CUTANEOUS | Status: DC

## 2012-04-07 NOTE — Patient Instructions (Addendum)
Thank you for coming in today.  Please use the Benzaclin cream for your acne. Please let me know if this isn't working and I will prescribe you another medication I will call you with the results of your labwork today and call in any needed prescriptions. Please start taking 400-600mg  of ibuprofen or Advil for your back pain. You can take up to 3200mg  a day for a short period of time. Please also do daily stretches. Of your back for relief.

## 2012-04-08 LAB — HIV ANTIBODY (ROUTINE TESTING W REFLEX): HIV: NONREACTIVE

## 2012-04-09 DIAGNOSIS — Z113 Encounter for screening for infections with a predominantly sexual mode of transmission: Secondary | ICD-10-CM | POA: Insufficient documentation

## 2012-04-09 NOTE — Assessment & Plan Note (Signed)
Will treat w/ Benzaclin Cream as this adds an antimicrobial aspect to pts clearsil. Pt to stop clearsil and only use Benzaclin. Will reevaluate as necessary.

## 2012-04-09 NOTE — Assessment & Plan Note (Signed)
Ordered Wet prep, GC/Chl, RPR, HIV today. Will f/u w/ pt if results are positive.

## 2012-04-09 NOTE — Assessment & Plan Note (Addendum)
Musculoskeletal. Pt has not tried NSAIDs. Recommending NSAIDS and stretches. Pt not taking Baclofen as previously prescribed. Will order if needed.
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
## 2012-04-10 ENCOUNTER — Telehealth: Payer: Self-pay | Admitting: Family Medicine

## 2012-04-10 NOTE — Telephone Encounter (Signed)
Informed patient of negative HIV/RPR/Wet Prep, told her that GC wasn't back yet but we would call her once we got those results, patient expressed understanding.

## 2012-04-10 NOTE — Telephone Encounter (Signed)
Patient is calling for her results from labs last week.

## 2012-04-12 NOTE — Telephone Encounter (Signed)
Left message on voicemail for patient to return call. 

## 2012-04-12 NOTE — Telephone Encounter (Signed)
Wants to know if results are back. 

## 2012-04-13 NOTE — Telephone Encounter (Signed)
Spoke with patient, informed her of negative gonorrhea/chlamydia, she expressed understanding.

## 2012-05-04 ENCOUNTER — Encounter: Payer: Self-pay | Admitting: Family Medicine

## 2012-05-04 ENCOUNTER — Ambulatory Visit (INDEPENDENT_AMBULATORY_CARE_PROVIDER_SITE_OTHER): Payer: Medicaid Other | Admitting: Family Medicine

## 2012-05-04 VITALS — BP 106/75 | HR 80 | Temp 97.3°F | Ht 65.0 in | Wt 208.7 lb

## 2012-05-04 DIAGNOSIS — Z309 Encounter for contraceptive management, unspecified: Secondary | ICD-10-CM

## 2012-05-04 DIAGNOSIS — Z113 Encounter for screening for infections with a predominantly sexual mode of transmission: Secondary | ICD-10-CM | POA: Insufficient documentation

## 2012-05-04 MED ORDER — NORELGESTROMIN-ETH ESTRADIOL 150-35 MCG/24HR TD PTWK: 1.0000 | MEDICATED_PATCH | TRANSDERMAL | Status: DC

## 2012-05-04 NOTE — Patient Instructions (Addendum)
Let me  Know if you are having a tough time to remember birth control so we can decide on a long term option  Ortho evra patch, put a new patch on weekly (take old one off).  No patch on week 4

## 2012-05-04 NOTE — Progress Notes (Signed)
  Subjective:    Patient ID: Virginia Cardenas, female    DOB: Oct 25, 1985, 26 y.o.   MRN: [id number]  HPI Patient here for concern of pregnancy  Has had mild abdominal cramping.  LMP less than 4 weeks ago.  Using condoms.  No significant change in sex frequency to incite concern.  Denis vaginal discharge, dyspareunia, breast tenderness, dysuria.   Review of Systems See HPI    Objective:   Physical Exam  GEN: Alert & Oriented, No acute distress        Assessment & Plan:

## 2012-05-04 NOTE — Assessment & Plan Note (Signed)
Patient not pregnant today.  She does not desire pregnancy.  Possible could be in early pregnancy. Advised if period does not come as scheduled, to repeat home pregnancy test.  Discussed on her options for contraception.  She is not currently interested in long term contraception.  Will Rx ortho evra.  Patient to return if unable to reliably use, unable to obtain, or positive pregnancy test.

## 2012-06-21 ENCOUNTER — Ambulatory Visit (INDEPENDENT_AMBULATORY_CARE_PROVIDER_SITE_OTHER): Payer: Medicaid Other | Admitting: Family Medicine

## 2012-06-21 ENCOUNTER — Other Ambulatory Visit (HOSPITAL_COMMUNITY)
Admission: RE | Admit: 2012-06-21 | Discharge: 2012-06-21 | Disposition: A | Discharge status: Home / Self Care | Payer: Medicaid Other | Source: Ambulatory Visit | Attending: Family Medicine | Admitting: Family Medicine

## 2012-06-21 VITALS — BP 110/72 | HR 84 | Temp 98.2°F | Ht 65.0 in | Wt 207.0 lb

## 2012-06-21 DIAGNOSIS — M79671 Pain in right foot: Secondary | ICD-10-CM

## 2012-06-21 DIAGNOSIS — J029 Acute pharyngitis, unspecified: Secondary | ICD-10-CM

## 2012-06-21 DIAGNOSIS — M79609 Pain in unspecified limb: Secondary | ICD-10-CM

## 2012-06-21 DIAGNOSIS — Z113 Encounter for screening for infections with a predominantly sexual mode of transmission: Secondary | ICD-10-CM | POA: Insufficient documentation

## 2012-06-21 LAB — CBC WITH DIFFERENTIAL/PLATELET
Eosinophils Absolute: 0.1 10*3/uL (ref 0.0–0.7)
Hemoglobin: 13.2 g/dL (ref 12.0–15.0)
Lymphocytes Relative: 20 % (ref 12–46)
Lymphs Abs: 1.9 10*3/uL (ref 0.7–4.0)
MCH: 30.3 pg (ref 26.0–34.0)
Monocytes Relative: 7 % (ref 3–12)
Neutro Abs: 6.8 10*3/uL (ref 1.7–7.7)
Neutrophils Relative %: 72 % (ref 43–77)
RBC: 4.35 MIL/uL (ref 3.87–5.11)
WBC: 9.5 10*3/uL (ref 4.0–10.5)

## 2012-06-21 LAB — HIV ANTIBODY (ROUTINE TESTING W REFLEX): HIV: NONREACTIVE

## 2012-06-21 NOTE — Patient Instructions (Addendum)
Unless your lab results are concerning, we will discuss them at your follow-up in 1 week  Please get an x-ray of your foot at your earliest convenience

## 2012-06-22 DIAGNOSIS — M79671 Pain in right foot: Secondary | ICD-10-CM | POA: Insufficient documentation

## 2012-06-22 LAB — ANTI-NUCLEAR AB-TITER (ANA TITER): ANA Titer 1: 1:320 {titer} — ABNORMAL HIGH

## 2012-06-22 LAB — ANA: Anti Nuclear Antibody(ANA): POSITIVE — AB

## 2012-06-22 NOTE — Assessment & Plan Note (Signed)
The cause of her foot pain is unclear. It started hurting abruptly 2 weeks ago and has been debilitating since then.  D/Dx: rheumatoid, lupus, gout or other crystalline arthropathy, fracture, infection  -Will check XR -Will check RF/anti-CCP, ANA>>>ANA positive, titers pending -Will check CBC>>>WNL -Will check GC/Chlamydia -Will check HIV>>>NR  Follow-up in 1 week or sooner if needed

## 2012-06-22 NOTE — Progress Notes (Signed)
  Subjective:    Patient ID: Virginia Cardenas, female    DOB: 04-18-1986, 26 y.o.   MRN: [id number]  HPI # Right foot pain for 2 weeks She denies trauma to her foot. It started hurting one day a couple of weeks ago and has been persistent versus worsening since then.  She has tried warm compresses and OTC analgesics, but they have not been helping.   She has been having to drag her foot around.   She is sexually active with her boyfriend only.  She denies history of tick bite or rash or being in the woods.  She has never had gout or other joint problem.  She denies family history of lupus.   She denies  ROS: denies tingling or numbness to her foot  # Sore throat for the past few days  Review of Systems Per HPI  Allergies, medication, past medical history reviewed.  Significant for: -Tobacco use    Objective:   Physical Exam GEN: NAD HEENT:   Head: [place]/AT   Eyes: normal conjunctiva without injection or tearing   Ears: TM clear bilaterally with good light reflex and without erythema or air-fluid level   Nose: no rhinorrhea, normal turbinates   Mouth: MMM; no tonsillar adenopathy; no oropharyngeal erythema NECK: no LAD PULM: NI WOB; CTAB SKIN: no rash MSK:    RIGHT FOOT: significant tenderness along dorsum of midfoot; mild edema compared to left foot; 2+ pedal pulses; sensation intact; able to wiggle toes and move foot and ankle appropriate but limited due to pain; no plantar or Achilles tenderness; 1+ Achilles reflexes bilaterally NEURO: GAIT: she drags her right foot or limps when asked to walk    Assessment & Plan:

## 2012-06-23 ENCOUNTER — Telehealth: Payer: Self-pay | Admitting: Family Medicine

## 2012-06-23 DIAGNOSIS — M79671 Pain in right foot: Secondary | ICD-10-CM

## 2012-06-23 NOTE — Assessment & Plan Note (Signed)
ANA titer 1:320, clinically significantly high.  We will discuss referral to her rheumatologist at her follow-up on 09/11.

## 2012-06-23 NOTE — Telephone Encounter (Signed)
Called to discuss lab results. Not home. Will discuss lab results 09/11 at follow-up.

## 2012-06-28 ENCOUNTER — Ambulatory Visit: Payer: Medicaid Other | Admitting: Family Medicine

## 2012-07-11 ENCOUNTER — Ambulatory Visit (INDEPENDENT_AMBULATORY_CARE_PROVIDER_SITE_OTHER): Payer: Medicaid Other | Admitting: Family Medicine

## 2012-07-11 VITALS — BP 113/79 | HR 76 | Temp 97.7°F | Ht 65.0 in | Wt 205.0 lb

## 2012-07-11 DIAGNOSIS — H109 Unspecified conjunctivitis: Secondary | ICD-10-CM | POA: Insufficient documentation

## 2012-07-11 MED ORDER — BACITRACIN-POLYMYXIN B 500-10000 UNIT/GM OP OINT: TOPICAL_OINTMENT | Freq: Two times a day (BID) | OPHTHALMIC | Status: DC

## 2012-07-11 NOTE — Assessment & Plan Note (Signed)
Likely viral though increasing pain w/ discharge is concerning for secondary bacterial involvement. Polysporin opthalmic x 5 days

## 2012-07-11 NOTE — Progress Notes (Signed)
  Subjective:    Patient ID: Virginia Cardenas, female    DOB: [birth year], 26 y.o.   MRN: [id number]  HPI CC: Eye pain  L eye discharge and redness started 2 days ago. Took out contacts thinking they were the problem but symptoms have persisted. Saline drops initially helped. Now becoming painful. Vision normal. R eye now becoming red and w/ discharge. Wakes up with crusting of eyelid and wet cheeks. Pt has been in contact w/ others w/ "eye sickness" recently. Denies fever, rash, n/v/d  Denies any allergies or h/o similar symptoms  Review of Systems Per hpi    Objective:   Physical Exam Gen: NAD HEENT: Bilateral conjunctivitis w/ photosensitivity. Mild puffiness of L eyelid. Painful to touch. No crusting       Assessment & Plan:

## 2012-07-11 NOTE — Patient Instructions (Addendum)
You have pink eye Please start using the eye drops every 12 hours in both eyes for the next 5 days Please stay out of work today and tomorrow. Do not wear contacts for the next 5 days Have a great day.   Conjunctivitis Conjunctivitis is commonly called "pink eye." Conjunctivitis can be caused by bacterial or viral infection, allergies, or injuries. There is usually redness of the lining of the eye, itching, discomfort, and sometimes discharge. There may be deposits of matter along the eyelids. A viral infection usually causes a watery discharge, while a bacterial infection causes a yellowish, thick discharge. Pink eye is very contagious and spreads by direct contact. You may be given antibiotic eyedrops as part of your treatment. Before using your eye medicine, remove all drainage from the eye by washing gently with warm water and cotton balls. Continue to use the medication until you have awakened 2 mornings in a row without discharge from the eye. Do not rub your eye. This increases the irritation and helps spread infection. Use separate towels from other household members. Wash your hands with soap and water before and after touching your eyes. Use cold compresses to reduce pain and sunglasses to relieve irritation from light. Do not wear contact lenses or wear eye makeup until the infection is gone. SEEK MEDICAL CARE IF:   Your symptoms are not better after 3 days of treatment.   You have increased pain or trouble seeing.   The outer eyelids become very red or swollen.  Document Released: 11/11/2004 Document Revised: 09/23/2011 Document Reviewed: 10/04/2005 [facility] Patient Information 2012 [place], [place].

## 2012-07-14 ENCOUNTER — Telehealth: Payer: Self-pay | Admitting: Family Medicine

## 2012-07-14 ENCOUNTER — Ambulatory Visit: Payer: Medicaid Other

## 2012-07-14 NOTE — Telephone Encounter (Signed)
Patient states eye is no better , actually seems worse.  Now has a knot beside ear and throat  Is sore. Appointment scheduled today for work in  at 1:30.

## 2012-07-14 NOTE — Telephone Encounter (Signed)
Pt's eye is worse and wants to speak to the nurse - eye drops not working

## 2012-07-16 ENCOUNTER — Emergency Department (HOSPITAL_COMMUNITY)
Admission: EM | Admit: 2012-07-16 | Discharge: 2012-07-16 | Disposition: A | Discharge status: Home / Self Care | Payer: Medicaid Other | Source: Home / Self Care | Attending: Family Medicine | Admitting: Family Medicine

## 2012-07-16 ENCOUNTER — Encounter (HOSPITAL_COMMUNITY): Payer: Self-pay | Admitting: Emergency Medicine

## 2012-07-16 DIAGNOSIS — H16002 Unspecified corneal ulcer, left eye: Secondary | ICD-10-CM

## 2012-07-16 MED ORDER — TETRACAINE HCL 0.5 % OP SOLN
OPHTHALMIC | Status: AC
  Filled 2012-07-16: qty 2

## 2012-07-16 NOTE — ED Notes (Signed)
Left eye irritation

## 2012-07-16 NOTE — ED Notes (Signed)
Pt c/o irritation of the left eye for 1 1/2 wks. Pt face appears swollen on the left side and ? Swollen gland by left ear. Pt states that she thought irritation was due to her contacts so she took them and out and eye got progressively. Pt went to her PCP and was dx with pink eye and was told to get eye looked at again if eye does not get any better. Pt states that she has pain in her eye and it has been constantly draining. Still using eye drops that were prescribed but no relief in symptoms.

## 2012-07-16 NOTE — ED Provider Notes (Signed)
History     CSN: [id number]  Arrival date & time 07/16/12  1302   First MD Initiated Contact with Patient 07/16/12 1309      Chief Complaint  Patient presents with  . Conjunctivitis    (Consider location/radiation/quality/duration/timing/severity/associated sxs/prior treatment) Patient is a 26 y.o. female presenting with conjunctivitis. The history is provided by the patient.  Conjunctivitis  The current episode started more than 1 week ago. The onset was gradual. The problem has been gradually worsening. The problem is moderate. Nothing relieves the symptoms. Associated symptoms include photophobia, ear pain, eye discharge, eye pain and eye redness.    History reviewed. No pertinent past medical history.  History reviewed. No pertinent past surgical history.  History reviewed. No pertinent family history.  History  Substance Use Topics  . Smoking status: Current Some Day Smoker -- 0.5 packs/day    Types: Cigarettes  . Smokeless tobacco: Not on file  . Alcohol Use: No    OB History    Grav Para Term Preterm Abortions TAB SAB Ect Mult Living                  Review of Systems  Constitutional: Negative.   HENT: Positive for ear pain.   Eyes: Positive for photophobia, pain, discharge and redness.    Allergies  Review of patient's allergies indicates no known allergies.  Home Medications   Current Outpatient Rx  Name Route Sig Dispense Refill  . BACITRACIN-POLYMYXIN B 500-10000 UNIT/GM OP OINT Both Eyes Place into both eyes every 12 (twelve) hours. apply to eye every 12 hours while awake. Treat for 5 days 3.5 g 0  . CLINDAMYCIN PHOS-BENZOYL PEROX 1-5 % EX GEL Topical Apply topically 2 (two) times daily. 25 g 1  . FERROUS SULFATE 325 (65 FE) MG PO TABS Oral Take 325 mg by mouth daily with breakfast.      . NORELGESTROMIN-ETH ESTRADIOL 150-20 MCG/24HR TD PTWK Transdermal Place 1 patch onto the skin once a week. 3 patch 11  . PRENATAL VITAMINS 0.8 MG PO TABS Oral  Take 1 tablet by mouth daily. While pregnant or breastfeeding.       BP 120/76  Pulse 85  Temp 98.7 F (37.1 C) (Oral)  Resp 18  SpO2 100%  LMP 06/14/2012  Physical Exam  Nursing note and vitals reviewed. Constitutional: She appears well-developed and well-nourished.  HENT:  Head: Normocephalic.  Right Ear: External ear normal.  Left Ear: External ear normal.  Ears:  Mouth/Throat: Oropharynx is clear and moist.  Eyes: EOM are normal. Pupils are equal, round, and reactive to light. Right conjunctiva is not injected. Left conjunctiva is injected.    Neck: Normal range of motion. Neck supple.    ED Course  Procedures (including critical care time)  Labs Reviewed - No data to display No results found.   1. Corneal ulcer, left       MDM          Linna Hoff, MD 07/16/12 1438

## 2012-07-16 NOTE — ED Notes (Addendum)
Patient was unable to complete visual acuity.  She normally wears contacts but doesn't have them in due to eye irritation

## 2013-02-14 ENCOUNTER — Encounter: Payer: Self-pay | Admitting: Family Medicine

## 2013-02-14 ENCOUNTER — Other Ambulatory Visit (HOSPITAL_COMMUNITY)
Admission: RE | Admit: 2013-02-14 | Discharge: 2013-02-14 | Disposition: A | Discharge status: Home / Self Care | Payer: Medicaid Other | Source: Ambulatory Visit | Attending: Family Medicine | Admitting: Family Medicine

## 2013-02-14 ENCOUNTER — Ambulatory Visit (INDEPENDENT_AMBULATORY_CARE_PROVIDER_SITE_OTHER): Payer: Medicaid Other | Admitting: Family Medicine

## 2013-02-14 ENCOUNTER — Telehealth: Payer: Self-pay | Admitting: Family Medicine

## 2013-02-14 VITALS — BP 122/64 | Ht 65.0 in | Wt 207.0 lb

## 2013-02-14 DIAGNOSIS — Z309 Encounter for contraceptive management, unspecified: Secondary | ICD-10-CM

## 2013-02-14 DIAGNOSIS — Z113 Encounter for screening for infections with a predominantly sexual mode of transmission: Secondary | ICD-10-CM | POA: Insufficient documentation

## 2013-02-14 DIAGNOSIS — N898 Other specified noninflammatory disorders of vagina: Secondary | ICD-10-CM

## 2013-02-14 DIAGNOSIS — Z3009 Encounter for other general counseling and advice on contraception: Secondary | ICD-10-CM

## 2013-02-14 LAB — POCT WET PREP (WET MOUNT): Clue Cells Wet Prep Whiff POC: POSITIVE

## 2013-02-14 MED ORDER — MEDROXYPROGESTERONE ACETATE 150 MG/ML IM SUSP
150.0000 mg | Freq: Once | INTRAMUSCULAR | Status: AC
  Administered 2013-02-14: 150 mg via INTRAMUSCULAR

## 2013-02-14 MED ORDER — METRONIDAZOLE 500 MG PO TABS: 500.0000 mg | ORAL_TABLET | Freq: Two times a day (BID) | ORAL | Status: DC

## 2013-02-14 NOTE — Assessment & Plan Note (Signed)
GC/Chlamydia and wet prep done today, patient unable to wait on results, also unable to get HIV/RPR done today because she cannot wait.  Will call her with results.  Reviewed safe sex practices.

## 2013-02-14 NOTE — Progress Notes (Signed)
  Subjective:    Patient ID: Virginia Cardenas, female    DOB: 1986-02-07, 27 y.o.   MRN: [id number]  HPI  Patient comes in due to vaginal discharge with odor that has been going on x1 week.  She has had unprotected sexual intercourse with a new partner 1 week ago.  She denies dysuria, abdominal pain, nausea/vomiting.  She also says she would like her depo today.   Review of Systems    See HPI Objective:   Physical Exam BP 122/64  Ht 5\' 5"  (1.651 m)  Wt 207 lb (93.895 kg)  BMI 34.45 kg/m2  LMP 01/22/2013 General appearance: alert, cooperative and no distress Pelvic: cervix normal in appearance, external genitalia normal, no adnexal masses or tenderness, no cervical motion tenderness, rectovaginal septum normal and uterus normal size, shape, and consistency; Vagina with thin white discharge.       Assessment & Plan:

## 2013-02-14 NOTE — Telephone Encounter (Signed)
Please call patient, let her know wet prep showed BV.  Rx sent in for Flagyl, twice a day for 7 days.  Let her know not to drink alcohol while taking flagyl.

## 2013-02-14 NOTE — Telephone Encounter (Signed)
LMOVM for pt to return call .Fleeger, Jessica Dawn  

## 2013-02-16 ENCOUNTER — Telehealth: Payer: Self-pay | Admitting: Family Medicine

## 2013-02-16 NOTE — Telephone Encounter (Signed)
Pt is asking for results of her test from the other day

## 2013-02-16 NOTE — Telephone Encounter (Signed)
LMOVM for pt to return call:  1. Pt has BV (bacterial vaginosis) Rx was sent to pharmacy  2. STD test negative  #. Pregnancy test negative. Fleeger, Maryjo [place]

## 2013-02-19 NOTE — Telephone Encounter (Signed)
Patient came into office requesting test results.  Informed of test results and to pick med up from pharmacy.  Gaylene Brooks, RN

## 2013-02-28 ENCOUNTER — Other Ambulatory Visit: Payer: Medicaid Other

## 2014-04-18 ENCOUNTER — Emergency Department (HOSPITAL_COMMUNITY)
Admission: EM | Admit: 2014-04-18 | Discharge: 2014-04-18 | Disposition: A | Discharge status: Home / Self Care | Payer: Medicaid Other | Attending: Emergency Medicine | Admitting: Emergency Medicine

## 2014-04-18 ENCOUNTER — Encounter (HOSPITAL_COMMUNITY): Payer: Self-pay | Admitting: Emergency Medicine

## 2014-04-18 ENCOUNTER — Emergency Department (HOSPITAL_COMMUNITY): Payer: Medicaid Other

## 2014-04-18 DIAGNOSIS — F17200 Nicotine dependence, unspecified, uncomplicated: Secondary | ICD-10-CM | POA: Insufficient documentation

## 2014-04-18 DIAGNOSIS — Z3202 Encounter for pregnancy test, result negative: Secondary | ICD-10-CM | POA: Insufficient documentation

## 2014-04-18 DIAGNOSIS — Z792 Long term (current) use of antibiotics: Secondary | ICD-10-CM | POA: Insufficient documentation

## 2014-04-18 DIAGNOSIS — M549 Dorsalgia, unspecified: Secondary | ICD-10-CM | POA: Insufficient documentation

## 2014-04-18 DIAGNOSIS — J069 Acute upper respiratory infection, unspecified: Secondary | ICD-10-CM | POA: Insufficient documentation

## 2014-04-18 DIAGNOSIS — N390 Urinary tract infection, site not specified: Secondary | ICD-10-CM | POA: Insufficient documentation

## 2014-04-18 LAB — URINE MICROSCOPIC-ADD ON

## 2014-04-18 LAB — URINALYSIS, ROUTINE W REFLEX MICROSCOPIC
BILIRUBIN URINE: NEGATIVE
Glucose, UA: NEGATIVE mg/dL
HGB URINE DIPSTICK: NEGATIVE
KETONES UR: NEGATIVE mg/dL
Nitrite: NEGATIVE
PROTEIN: NEGATIVE mg/dL
Specific Gravity, Urine: 1.021 (ref 1.005–1.030)
UROBILINOGEN UA: 1 mg/dL (ref 0.0–1.0)
pH: 7.5 (ref 5.0–8.0)

## 2014-04-18 LAB — POC URINE PREG, ED: Preg Test, Ur: NEGATIVE

## 2014-04-18 MED ORDER — KETOROLAC TROMETHAMINE 60 MG/2ML IM SOLN
30.0000 mg | Freq: Once | INTRAMUSCULAR | Status: AC
  Administered 2014-04-18: 30 mg via INTRAMUSCULAR
  Filled 2014-04-18: qty 2

## 2014-04-18 MED ORDER — CIPROFLOXACIN HCL 500 MG PO TABS: 500.0000 mg | ORAL_TABLET | Freq: Two times a day (BID) | ORAL | Status: DC

## 2014-04-18 MED ORDER — ONDANSETRON 4 MG PO TBDP
4.0000 mg | ORAL_TABLET | Freq: Once | ORAL | Status: AC
  Administered 2014-04-18: 4 mg via ORAL
  Filled 2014-04-18: qty 1

## 2014-04-18 MED ORDER — HYDROCODONE-ACETAMINOPHEN 5-325 MG PO TABS: 1.0000 | ORAL_TABLET | ORAL | Status: DC | PRN

## 2014-04-18 MED ORDER — KETOROLAC TROMETHAMINE 60 MG/2ML IM SOLN: 60.0000 mg | Freq: Once | INTRAMUSCULAR | Status: DC

## 2014-04-18 MED ORDER — CIPROFLOXACIN HCL 500 MG PO TABS
500.0000 mg | ORAL_TABLET | Freq: Once | ORAL | Status: AC
  Administered 2014-04-18: 500 mg via ORAL
  Filled 2014-04-18: qty 1

## 2014-04-18 MED ORDER — PROMETHAZINE HCL 25 MG PO TABS: 25.0000 mg | ORAL_TABLET | Freq: Four times a day (QID) | ORAL | Status: DC | PRN

## 2014-04-18 MED ORDER — DIAZEPAM 5 MG PO TABS
5.0000 mg | ORAL_TABLET | Freq: Once | ORAL | Status: AC
  Administered 2014-04-18: 5 mg via ORAL
  Filled 2014-04-18: qty 1

## 2014-04-18 NOTE — ED Notes (Signed)
Food given to patient.  

## 2014-04-18 NOTE — ED Notes (Signed)
Placed on monitor and in gown upon arrival to room

## 2014-04-18 NOTE — ED Notes (Addendum)
Reports having a productive cough, chills and now lower back pain, has pain after she urinates. Denies vaginal discharge or itching, unsure of last menstrual cycle. No acute distress noted at triage.

## 2014-04-18 NOTE — ED Notes (Signed)
Patient states she has been coughing and waking up at night sweating for a week now.

## 2014-04-18 NOTE — ED Provider Notes (Signed)
CSN: [id number]     Arrival date & time 04/18/14  1230 History   First MD Initiated Contact with Patient 04/18/14 1245     Chief Complaint  Patient presents with  . Cough  . Back Pain     (Consider location/radiation/quality/duration/timing/severity/associated sxs/prior Treatment) HPI  Patient to the ED with all thank for the past week. She describes the cough as "attacks of coughing". She is woken up in the middle of the night coughing and sweating. She also has had dysuria and has noticed a smell to her urine. She reports having had a UTI a few months ago. She has not been sexually active for the past 6 months and has had no vaginal discharge or itching- She reports she is due for a routine gyn check. She denies having fevers, n,v,d. She denies feeling weak or fatigued. She comes to the ED because she is now having low back pain.  History reviewed. No pertinent past medical history. History reviewed. No pertinent past surgical history. History reviewed. No pertinent family history. History  Substance Use Topics  . Smoking status: Current Some Day Smoker -- 0.50 packs/day    Types: Cigarettes  . Smokeless tobacco: Not on file  . Alcohol Use: No   OB History   Grav Para Term Preterm Abortions TAB SAB Ect Mult Living                 Review of Systems   Review of Systems  Gen: no weight loss, fevers, chills, night sweats  Eyes: no discharge or drainage, no occular pain or visual changes  Nose: no epistaxis or rhinorrhea  Mouth: no dental pain, no sore throat  Neck: no neck pain  Lungs:No wheezingor hemoptysis, + coughing  CV: no chest pain, palpitations, dependent edema or orthopnea  Abd: no abdominal pain, nausea, vomiting, diarrhea, positive low back pain GU: + dysuria and foul smelling urine No gross hematuria  MSK:  No muscle weakness or pain Neuro: no headache, no focal neurologic deficits  Skin: no rash or wounds Psyche: no complaints    Allergies  Latex  Home  Medications   Prior to Admission medications   Medication Sig Start Date End Date Taking? Authorizing Provider  ciprofloxacin (CIPRO) 500 MG tablet Take 1 tablet (500 mg total) by mouth 2 (two) times daily. 04/18/14   Mirabella Hilario Marilu Favre, PA-C  HYDROcodone-acetaminophen (NORCO/VICODIN) 5-325 MG per tablet Take 1 tablet by mouth every 4 (four) hours as needed. 04/18/14   Aydian Dimmick Marilu Favre, PA-C  promethazine (PHENERGAN) 25 MG tablet Take 1 tablet (25 mg total) by mouth every 6 (six) hours as needed for nausea or vomiting. 04/18/14   Wateen Varon Marilu Favre, PA-C   BP 109/68  Pulse 68  Temp(Src) 97.9 F (36.6 C) (Oral)  Resp 18  SpO2 100%  LMP 02/15/2014 Physical Exam  Nursing note and vitals reviewed. Constitutional: She appears well-developed and well-nourished. No distress.  HENT:  Head: Normocephalic and atraumatic.  Eyes: Pupils are equal, round, and reactive to light.  Neck: Normal range of motion. Neck supple.  Cardiovascular: Normal rate and regular rhythm.   Pulmonary/Chest: Effort normal and breath sounds normal. No respiratory distress. She has no wheezes.  + coughing during physical exam  Abdominal: Soft. Bowel sounds are normal. She exhibits no distension and no fluid wave. There is no hepatosplenomegaly. There is tenderness in the suprapubic area. There is no rigidity, no rebound, no guarding, no CVA tenderness and negative Murphy's sign.  No CVA  tenderness, some mild suprapubic tenderness  Neurological: She is alert.  Skin: Skin is warm and dry.    ED Course  Procedures (including critical care time) Labs Review Labs Reviewed  URINALYSIS, ROUTINE W REFLEX MICROSCOPIC - Abnormal; Notable for the following:    APPearance CLOUDY (*)    Leukocytes, UA MODERATE (*)    All other components within normal limits  URINE CULTURE  URINE MICROSCOPIC-ADD ON  POC URINE PREG, ED    Imaging Review Dg Chest 2 View  04/18/2014   CLINICAL DATA:  Cough, night sweats  EXAM: CHEST  2 VIEW   COMPARISON:  June 01, 2011  FINDINGS: The heart size and mediastinal contours are within normal limits. There is no focal infiltrate, pulmonary edema, or pleural effusion. The visualized skeletal structures are unremarkable.  IMPRESSION: No active cardiopulmonary disease.   Electronically Signed   By: Abelardo Diesel M.D.   On: 04/18/2014 13:55     EKG Interpretation None      MDM   Final diagnoses:  URI (upper respiratory infection)  UTI (lower urinary tract infection)   2:03 pm Patient reports that she wants a routine gynecological exam. She is having dysuria with low back pain but no vaginal symptoms and her urine pregnancy is negative. She's also complaining of cough with sweating. We'll check for urinary tract infection.  2:50 pm Chest xray shows no pneumonia or acute cardiopulmonary findings. Urinalysis shows a negative urine preg with moderate leukocytes. Given 30 mg IM Toradol in the ED as well as her first dose of ABX and nausea medication. She is having no systemic symptoms of flank pain, fevers, tachycardia, nausea, vomiting or diarrhea to warrant cbc/bmp or IV abx at this time.  She has been told that if her" pain doesnt improve are you develop fevers, vomiting, weakness, diarrhea return to ED because you may need IV abx."  Rx:  ciprofloxacin (CIPRO) 500 MG tablet Take 1 tablet (500 mg total) by mouth 2 (two) times daily. 28 tablet Linus Mako, PA-C   HYDROcodone-acetaminophen (NORCO/VICODIN) 5-325 MG per tablet Take 1 tablet by mouth every 4 (four) hours as needed. 12 tablet Rachana Malesky Marilu Favre, PA-C   promethazine (PHENERGAN) 25 MG tablet Take 1 tablet (25 mg total) by mouth every 6 (six) hours as needed for nausea or vomiting. 30 tablet Linus Mako, PA-C  28 y.o.Salle L Fidler's evaluation in the Emergency Department is complete. It has been determined that no acute conditions requiring further emergency intervention are present at this time. The patient/guardian  have been advised of the diagnosis and plan. We have discussed signs and symptoms that warrant return to the ED, such as changes or worsening in symptoms.  Vital signs are stable at discharge. Filed Vitals:   04/18/14 1421  BP: 109/68  Pulse:   Temp:   Resp:     Patient/guardian has voiced understanding and agreed to follow-up with the PCP or specialist.    Linus Mako, PA-C 04/18/14 1453

## 2014-04-18 NOTE — ED Notes (Signed)
Pt discharged to home with family. NAD.  

## 2014-04-18 NOTE — ED Notes (Signed)
PA at bedside.

## 2014-04-18 NOTE — Discharge Instructions (Signed)
Upper Respiratory Infection, Adult An upper respiratory infection (URI) is also sometimes known as the common cold. The upper respiratory tract includes the nose, sinuses, throat, trachea, and bronchi. Bronchi are the airways leading to the lungs. Most people improve within 1 week, but symptoms can last up to 2 weeks. A residual cough may last even longer.  CAUSES Many different viruses can infect the tissues lining the upper respiratory tract. The tissues become irritated and inflamed and often become very moist. Mucus production is also common. A cold is contagious. You can easily spread the virus to others by oral contact. This includes kissing, sharing a glass, coughing, or sneezing. Touching your mouth or nose and then touching a surface, which is then touched by another person, can also spread the virus. SYMPTOMS  Symptoms typically develop 1 to 3 days after you come in contact with a cold virus. Symptoms vary from person to person. They may include:  Runny nose.  Sneezing.  Nasal congestion.  Sinus irritation.  Sore throat.  Loss of voice (laryngitis).  Cough.  Fatigue.  Muscle aches.  Loss of appetite.  Headache.  Low-grade fever. DIAGNOSIS  You might diagnose your own cold based on familiar symptoms, since most people get a cold 2 to 3 times a year. Your caregiver can confirm this based on your exam. Most importantly, your caregiver can check that your symptoms are not due to another disease such as strep throat, sinusitis, pneumonia, asthma, or epiglottitis. Blood tests, throat tests, and X-rays are not necessary to diagnose a common cold, but they may sometimes be helpful in excluding other more serious diseases. Your caregiver will decide if any further tests are required. RISKS AND COMPLICATIONS  You may be at risk for a more severe case of the common cold if you smoke cigarettes, have chronic heart disease (such as heart failure) or lung disease (such as asthma), or if  you have a weakened immune system. The very young and very old are also at risk for more serious infections. Bacterial sinusitis, middle ear infections, and bacterial pneumonia can complicate the common cold. The common cold can worsen asthma and chronic obstructive pulmonary disease (COPD). Sometimes, these complications can require emergency medical care and may be life-threatening. PREVENTION  The best way to protect against getting a cold is to practice good hygiene. Avoid oral or hand contact with people with cold symptoms. Wash your hands often if contact occurs. There is no clear evidence that vitamin C, vitamin E, echinacea, or exercise reduces the chance of developing a cold. However, it is always recommended to get plenty of rest and practice good nutrition. TREATMENT  Treatment is directed at relieving symptoms. There is no cure. Antibiotics are not effective, because the infection is caused by a virus, not by bacteria. Treatment may include:  Increased fluid intake. Sports drinks offer valuable electrolytes, sugars, and fluids.  Breathing heated mist or steam (vaporizer or shower).  Eating chicken soup or other clear broths, and maintaining good nutrition.  Getting plenty of rest.  Using gargles or lozenges for comfort.  Controlling fevers with ibuprofen or acetaminophen as directed by your caregiver.  Increasing usage of your inhaler if you have asthma. Zinc gel and zinc lozenges, taken in the first 24 hours of the common cold, can shorten the duration and lessen the severity of symptoms. Pain medicines may help with fever, muscle aches, and throat pain. A variety of non-prescription medicines are available to treat congestion and runny nose. Your caregiver   can make recommendations and may suggest nasal or lung inhalers for other symptoms.  HOME CARE INSTRUCTIONS   Only take over-the-counter or prescription medicines for pain, discomfort, or fever as directed by your  caregiver.  Use a warm mist humidifier or inhale steam from a shower to increase air moisture. This may keep secretions moist and make it easier to breathe.  Drink enough water and fluids to keep your urine clear or pale yellow.  Rest as needed.  Return to work when your temperature has returned to normal or as your caregiver advises. You may need to stay home longer to avoid infecting others. You can also use a face mask and careful hand washing to prevent spread of the virus. SEEK MEDICAL CARE IF:   After the first few days, you feel you are getting worse rather than better.  You need your caregiver's advice about medicines to control symptoms.  You develop chills, worsening shortness of breath, or brown or red sputum. These may be signs of pneumonia.  You develop yellow or brown nasal discharge or pain in the face, especially when you bend forward. These may be signs of sinusitis.  You develop a fever, swollen neck glands, pain with swallowing, or white areas in the back of your throat. These may be signs of strep throat. SEEK IMMEDIATE MEDICAL CARE IF:   You have a fever.  You develop severe or persistent headache, ear pain, sinus pain, or chest pain.  You develop wheezing, a prolonged cough, cough up blood, or have a change in your usual mucus (if you have chronic lung disease).  You develop sore muscles or a stiff neck. Document Released: 03/30/2001 Document Revised: 12/27/2011 Document Reviewed: 02/05/2011 [employer] Patient Information 2015 [employer]. This information is not intended to replace advice given to you by your health care provider. Make sure you discuss any questions you have with your health care provider.  

## 2014-04-18 NOTE — ED Notes (Signed)
Pt states periods have been irregular and that she would also liked to be checked for STDS while she is here.

## 2014-04-18 NOTE — ED Notes (Signed)
Placed pt on monitor upon arrival to room from radiology. Pt given water to complete fluid challenge at this time.

## 2014-04-19 NOTE — ED Provider Notes (Signed)
Medical screening examination/treatment/procedure(s) were performed by non-physician practitioner and as supervising physician I was immediately available for consultation/collaboration.   EKG Interpretation None       Virgel Manifold, MD 04/19/14 [phone]

## 2014-04-20 LAB — URINE CULTURE

## 2014-12-08 ENCOUNTER — Emergency Department (HOSPITAL_BASED_OUTPATIENT_CLINIC_OR_DEPARTMENT_OTHER)
Admission: EM | Admit: 2014-12-08 | Discharge: 2014-12-09 | Disposition: A | Discharge status: Home / Self Care | Payer: Medicaid Other | Attending: Emergency Medicine | Admitting: Emergency Medicine

## 2014-12-08 ENCOUNTER — Encounter (HOSPITAL_BASED_OUTPATIENT_CLINIC_OR_DEPARTMENT_OTHER): Payer: Self-pay | Admitting: *Deleted

## 2014-12-08 DIAGNOSIS — Z720 Tobacco use: Secondary | ICD-10-CM | POA: Insufficient documentation

## 2014-12-08 DIAGNOSIS — Z91040 Latex allergy status: Secondary | ICD-10-CM | POA: Insufficient documentation

## 2014-12-08 DIAGNOSIS — N390 Urinary tract infection, site not specified: Secondary | ICD-10-CM

## 2014-12-08 DIAGNOSIS — Z3202 Encounter for pregnancy test, result negative: Secondary | ICD-10-CM | POA: Insufficient documentation

## 2014-12-08 DIAGNOSIS — Z792 Long term (current) use of antibiotics: Secondary | ICD-10-CM | POA: Insufficient documentation

## 2014-12-08 DIAGNOSIS — R319 Hematuria, unspecified: Secondary | ICD-10-CM

## 2014-12-08 DIAGNOSIS — Z79899 Other long term (current) drug therapy: Secondary | ICD-10-CM | POA: Insufficient documentation

## 2014-12-08 LAB — URINALYSIS, ROUTINE W REFLEX MICROSCOPIC
Bilirubin Urine: NEGATIVE
Glucose, UA: NEGATIVE mg/dL
Ketones, ur: 15 mg/dL — AB
Nitrite: POSITIVE — AB
PH: 5 (ref 5.0–8.0)
PROTEIN: 100 mg/dL — AB
Specific Gravity, Urine: 1.02 (ref 1.005–1.030)
Urobilinogen, UA: 1 mg/dL (ref 0.0–1.0)

## 2014-12-08 LAB — URINE MICROSCOPIC-ADD ON

## 2014-12-08 NOTE — ED Notes (Signed)
C/o uti x 3 days. C/o burning on urination.

## 2014-12-09 LAB — PREGNANCY, URINE: Preg Test, Ur: NEGATIVE

## 2014-12-09 MED ORDER — CEPHALEXIN 250 MG PO CAPS
500.0000 mg | ORAL_CAPSULE | Freq: Once | ORAL | Status: AC
  Administered 2014-12-09: 500 mg via ORAL
  Filled 2014-12-09: qty 2

## 2014-12-09 MED ORDER — ONDANSETRON 4 MG PO TBDP
4.0000 mg | ORAL_TABLET | Freq: Once | ORAL | Status: AC
  Administered 2014-12-09: 4 mg via ORAL
  Filled 2014-12-09: qty 1

## 2014-12-09 MED ORDER — IBUPROFEN 800 MG PO TABS: 800.0000 mg | ORAL_TABLET | Freq: Three times a day (TID) | ORAL | Status: DC | PRN

## 2014-12-09 MED ORDER — IBUPROFEN 800 MG PO TABS
800.0000 mg | ORAL_TABLET | Freq: Once | ORAL | Status: AC
  Administered 2014-12-09: 800 mg via ORAL
  Filled 2014-12-09: qty 1

## 2014-12-09 MED ORDER — CEPHALEXIN 500 MG PO CAPS
500.0000 mg | ORAL_CAPSULE | Freq: Two times a day (BID) | ORAL | Status: DC
Start: 2014-12-09 — End: 2014-12-10

## 2014-12-09 MED ORDER — TRAMADOL HCL 50 MG PO TABS
50.0000 mg | ORAL_TABLET | Freq: Once | ORAL | Status: AC
  Administered 2014-12-09: 50 mg via ORAL
  Filled 2014-12-09: qty 1

## 2014-12-09 NOTE — Discharge Instructions (Signed)

## 2014-12-09 NOTE — ED Provider Notes (Signed)
TIME SEEN: This chart was scribed for Maybee, DO by Hilda Lias, ED Scribe. This patient was seen in room MH05/[facility]05 and the patient's care was started at 12:09 AM.   CHIEF COMPLAINT:  Chief Complaint  Patient presents with  . Urinary Tract Infection     HPI: HPI Comments: Virginia Cardenas is a 29 y.o. female who presents to the Emergency Department complaining of a UTI with associated dysuria that has been present for three days. Pt states she has had a subjective fever and has had nausea but no vomiting. Pt also notes being constipated at this time. Denies diarrhea. Is passing gas. Has had UTIs in the past. No hematuria. No vaginal bleeding or discharge. Last menstrual period was 12/02/14.   ROS: See HPI Constitutional: subjective fever  Eyes: no drainage  ENT: no runny nose   Cardiovascular:  no chest pain  Resp: no SOB  GI: no vomiting, constipated  GU: dysuria Integumentary: no rash  Allergy: no hives  Musculoskeletal: no leg swelling  Neurological: no slurred speech ROS otherwise negative  PAST MEDICAL HISTORY/PAST SURGICAL HISTORY:  History reviewed. No pertinent past medical history.  MEDICATIONS:  Prior to Admission medications   Medication Sig Start Date End Date Taking? Authorizing Provider  ciprofloxacin (CIPRO) 500 MG tablet Take 1 tablet (500 mg total) by mouth 2 (two) times daily. 04/18/14   Tiffany Marilu Favre, PA-C  HYDROcodone-acetaminophen (NORCO/VICODIN) 5-325 MG per tablet Take 1 tablet by mouth every 4 (four) hours as needed. 04/18/14   Tiffany Marilu Favre, PA-C  promethazine (PHENERGAN) 25 MG tablet Take 1 tablet (25 mg total) by mouth every 6 (six) hours as needed for nausea or vomiting. 04/18/14   Linus Mako, PA-C    ALLERGIES:  Allergies  Allergen Reactions  . Latex Rash    SOCIAL HISTORY:  History  Substance Use Topics  . Smoking status: Current Some Day Smoker -- 0.50 packs/day    Types: Cigarettes  . Smokeless tobacco: Not on file  .  Alcohol Use: No    FAMILY HISTORY: No family history on file.  EXAM: BP 111/59 mmHg  Pulse 82  Temp(Src) 97.6 F (36.4 C) (Oral)  Resp 18  Ht 5\' 5"  (1.651 m)  Wt 220 lb (99.791 kg)  BMI 36.61 kg/m2  SpO2 100%  LMP 12/02/2014 CONSTITUTIONAL: Alert and oriented and responds appropriately to questions. Well-appearing; well-nourished HEAD: Normocephalic EYES: Conjunctivae clear, PERRL ENT: normal nose; no rhinorrhea; moist mucous membranes; pharynx without lesions noted NECK: Supple, no meningismus, no LAD  CARD: RRR; S1 and S2 appreciated; no murmurs, no clicks, no rubs, no gallops RESP: Normal chest excursion without splinting or tachypnea; breath sounds clear and equal bilaterally; no wheezes, no rhonchi, no rales,  ABD/GI: Normal bowel sounds; non-distended; soft, non-tender, no rebound, no guarding, mildly tender over suprapubic region of abdomen, no peritoneal signs BACK:  The back appears normal and is non-tender to palpation, there is no CVA tenderness EXT: Normal ROM in all joints; non-tender to palpation; no edema; normal capillary refill; no cyanosis    SKIN: Normal color for age and race; warm NEURO: Moves all extremities equally PSYCH: The patient's mood and manner are appropriate. Grooming and personal hygiene are appropriate.  MEDICAL DECISION MAKING: Patient here with nitrite positive urinary tract infection. Culture pending. Prior culture in our system negative. Patient is not sure what antibiotic has helped her in the past. She is very well-appearing, nontoxic without flank pain. Vital signs normal. We'll discharge  home on Keflex and with ibuprofen for pain. Discussed return precautions. She verbalized understanding and is comfortable with plan.  I personally performed the services described in this documentation, which was scribed in my presence. The recorded information has been reviewed and is accurate.       [place], DO 12/09/14 [phone]

## 2014-12-10 ENCOUNTER — Emergency Department (INDEPENDENT_AMBULATORY_CARE_PROVIDER_SITE_OTHER)
Admission: EM | Admit: 2014-12-10 | Discharge: 2014-12-10 | Disposition: A | Discharge status: Home / Self Care | Payer: Self-pay | Source: Home / Self Care | Attending: Family Medicine | Admitting: Family Medicine

## 2014-12-10 ENCOUNTER — Encounter (HOSPITAL_COMMUNITY): Payer: Self-pay | Admitting: Family Medicine

## 2014-12-10 DIAGNOSIS — N390 Urinary tract infection, site not specified: Secondary | ICD-10-CM

## 2014-12-10 LAB — POCT URINALYSIS DIP (DEVICE)
BILIRUBIN URINE: NEGATIVE
GLUCOSE, UA: 100 mg/dL — AB
Ketones, ur: NEGATIVE mg/dL
Nitrite: POSITIVE — AB
Protein, ur: 100 mg/dL — AB
Specific Gravity, Urine: 1.015 (ref 1.005–1.030)
Urobilinogen, UA: 1 mg/dL (ref 0.0–1.0)
pH: 5.5 (ref 5.0–8.0)

## 2014-12-10 MED ORDER — ACETAMINOPHEN 325 MG PO TABS
ORAL_TABLET | ORAL | Status: AC
  Filled 2014-12-10: qty 3

## 2014-12-10 MED ORDER — PHENAZOPYRIDINE HCL 100 MG PO TABS: 100.0000 mg | ORAL_TABLET | Freq: Three times a day (TID) | ORAL | Status: DC | PRN

## 2014-12-10 MED ORDER — CIPROFLOXACIN HCL 500 MG PO TABS: 500.0000 mg | ORAL_TABLET | Freq: Two times a day (BID) | ORAL | Status: DC

## 2014-12-10 MED ORDER — ACETAMINOPHEN 500 MG PO TABS
1000.0000 mg | ORAL_TABLET | Freq: Once | ORAL | Status: AC
  Administered 2014-12-10: 1000 mg via ORAL

## 2014-12-10 MED ORDER — CEFTRIAXONE SODIUM 1 G IJ SOLR
INTRAMUSCULAR | Status: AC
  Filled 2014-12-10: qty 10

## 2014-12-10 MED ORDER — LIDOCAINE HCL (PF) 1 % IJ SOLN
INTRAMUSCULAR | Status: AC
  Filled 2014-12-10: qty 5

## 2014-12-10 MED ORDER — FLUCONAZOLE 150 MG PO TABS: 150.0000 mg | ORAL_TABLET | Freq: Every day | ORAL | Status: DC

## 2014-12-10 MED ORDER — CEFTRIAXONE SODIUM 1 G IJ SOLR
1.0000 g | Freq: Once | INTRAMUSCULAR | Status: AC
  Administered 2014-12-10: 1 g via INTRAMUSCULAR

## 2014-12-10 NOTE — Discharge Instructions (Signed)
You were given a very strong antibiotic to help clear this infection. Urine shows you still have a significant infection. Please start taking the Pyridium for the pain. This will help significantly. This may turn her urine orange to red. Please go back to the emergency room if you get worse. Start taking MiraLAX twice daily for the constipation until you start having soft daily bowel movements.  Start taking a probiotic or daily yogurt to help replenish your gut bacteria flora.

## 2014-12-10 NOTE — ED Provider Notes (Signed)
CSN: [id number]     Arrival date & time 12/10/14  1050 History   First MD Initiated Contact with Patient 12/10/14 1252     Chief Complaint  Patient presents with  . Urinary Tract Infection   (Consider location/radiation/quality/duration/timing/severity/associated sxs/prior Treatment) HPI  Presenting w/ ongoing symptoms of dysuria and frequency. Present for 5 days. Getting worse. Constant. On Keflex since being seen on 12/09/14 in ED. Ibuprofen 1200mg  ibuprofen w/o relief. Now w/ R sided pain for 20 min at a time. Pain only comes on right after taking Keflex. Staying well hydrated w/ some improvement. Denies fevers, abdominal pain, chest pain, shortness of breath, rash, vaginal discharge or irritation.  Constipated for past 3 days. Difficult to pass very small hard stool. Typically has daily BMs. No change in diet.    History reviewed. No pertinent past medical history. History reviewed. No pertinent past surgical history. Family History  Problem Relation Age of Onset  . Cancer Neg Hx   . Diabetes Neg Hx   . Heart failure Neg Hx   . Hyperlipidemia Neg Hx    History  Substance Use Topics  . Smoking status: Current Some Day Smoker -- 0.50 packs/day    Types: Cigarettes  . Smokeless tobacco: Not on file  . Alcohol Use: No   OB History    No data available     Review of Systems Per HPI with all other pertinent systems negative.   Allergies  Latex  Home Medications   Prior to Admission medications   Medication Sig Start Date End Date Taking? Authorizing Provider  ibuprofen (ADVIL,MOTRIN) 800 MG tablet Take 1 tablet (800 mg total) by mouth every 8 (eight) hours as needed for mild pain. 12/09/14  Yes Kristen N Ward, DO  ciprofloxacin (CIPRO) 500 MG tablet Take 1 tablet (500 mg total) by mouth 2 (two) times daily. 12/10/14   Waldemar Dickens, MD  fluconazole (DIFLUCAN) 150 MG tablet Take 1 tablet (150 mg total) by mouth daily. Repeat dose in 3 days 12/10/14   Waldemar Dickens, MD   HYDROcodone-acetaminophen (NORCO/VICODIN) 5-325 MG per tablet Take 1 tablet by mouth every 4 (four) hours as needed. 04/18/14   Tiffany Marilu Favre, PA-C  phenazopyridine (PYRIDIUM) 100 MG tablet Take 1-2 tablets (100-200 mg total) by mouth 3 (three) times daily as needed for pain. 12/10/14   Waldemar Dickens, MD  promethazine (PHENERGAN) 25 MG tablet Take 1 tablet (25 mg total) by mouth every 6 (six) hours as needed for nausea or vomiting. 04/18/14   Tiffany Marilu Favre, PA-C   BP 121/82 mmHg  Pulse 85  Temp(Src) 98.2 F (36.8 C) (Oral)  Resp 16  SpO2 100%  LMP 12/02/2014 Physical Exam  Constitutional: She is oriented to person, place, and time. She appears well-developed and well-nourished.  HENT:  Head: Normocephalic and atraumatic.  Eyes: EOM are normal. Pupils are equal, round, and reactive to light.  Neck: Normal range of motion.  Cardiovascular: Normal rate, normal heart sounds and intact distal pulses.   Pulmonary/Chest: Effort normal and breath sounds normal.  Abdominal: Soft.  Suprapubic tenderness to palpation. No CVA tenderness  Musculoskeletal: Normal range of motion.  Neurological: She is alert and oriented to person, place, and time.  Skin: Skin is warm and dry.  Psychiatric: She has a normal mood and affect. Her behavior is normal. Thought content normal.    ED Course  Procedures (including critical care time) Labs Review Labs Reviewed  POCT URINALYSIS DIP (DEVICE) - Abnormal;  Notable for the following:    Glucose, UA 100 (*)    Hgb urine dipstick LARGE (*)    Protein, ur 100 (*)    Nitrite POSITIVE (*)    Leukocytes, UA LARGE (*)    All other components within normal limits  URINE CULTURE    Imaging Review No results found.   MDM   1. UTI (lower urinary tract infection)    Previous medical records from emergency room visit reviewed. Patient has been compliant on her Keflex 500 twice a day. Patient now requesting hydrocodone for the pain. Reiterated to patient  that narcotics would be contraindicated especially given severe constipation. Urine culture showing greater than 100,000 Escherichia coli from previous urine sample on the 22nd. Called the microbiology department specifically to see if sensitivities have been completed and was told that they are still in process and may not be back until later today or tomorrow. UA today shows continued urinary tract infection. Tylenol 1 g by mouth given ceftriaxone 1 g IM given. Urine culture sent. Will start patient on Cipro 500 mg twice a day. Diflucan prescription also given case patient develops yeast infection. Patient to return to the emergency room if develops worsening symptoms specifically fever, back pain. Patient also given prescription for Pyridium for pain control.  Precautions given and all questions answered.  Linna Darner, MD Family Medicine 12/10/2014, 1:20 PM      Waldemar Dickens, MD 12/10/14 1320

## 2014-12-10 NOTE — ED Notes (Signed)
Reports being treated for a uti.   States symptoms are worse.  Having right sided flank pain radiating to back.  No relief with prescribed meds.

## 2014-12-11 LAB — URINE CULTURE
Colony Count: NO GROWTH
Culture: NO GROWTH

## 2014-12-12 ENCOUNTER — Telehealth (HOSPITAL_COMMUNITY): Payer: Self-pay

## 2014-12-12 NOTE — ED Notes (Signed)
Post ED Visit - Positive Culture Follow-up  Culture report reviewed by antimicrobial stewardship pharmacist: []  Wes [place], Pharm.D., BCPS [x]  Heide Guile, Pharm.D., BCPS []  Alycia Rossetti, Pharm.D., BCPS []  [place], Pharm.D., BCPS, AAHIVP []  Legrand Como, Pharm.D., BCPS, AAHIVP []  Isac Sarna, Pharm.D., BCPS  Positiveurine culture Treated with cephalexin, organism sensitive to the same and no further patient follow-up is required at this time.  Ileene Musa 12/12/2014, 11:11 AM

## 2015-07-14 ENCOUNTER — Encounter (HOSPITAL_COMMUNITY): Payer: Self-pay | Admitting: Emergency Medicine

## 2015-07-14 ENCOUNTER — Emergency Department (INDEPENDENT_AMBULATORY_CARE_PROVIDER_SITE_OTHER)
Admission: EM | Admit: 2015-07-14 | Discharge: 2015-07-14 | Disposition: A | Discharge status: Home / Self Care | Payer: Self-pay | Source: Home / Self Care | Attending: Family Medicine | Admitting: Family Medicine

## 2015-07-14 DIAGNOSIS — R21 Rash and other nonspecific skin eruption: Secondary | ICD-10-CM

## 2015-07-14 DIAGNOSIS — Z30012 Encounter for prescription of emergency contraception: Secondary | ICD-10-CM

## 2015-07-14 DIAGNOSIS — Z789 Other specified health status: Secondary | ICD-10-CM

## 2015-07-14 LAB — POCT PREGNANCY, URINE: PREG TEST UR: NEGATIVE

## 2015-07-14 MED ORDER — NYSTATIN-TRIAMCINOLONE 100000-0.1 UNIT/GM-% EX OINT: 1.0000 "application " | TOPICAL_OINTMENT | Freq: Two times a day (BID) | CUTANEOUS | Status: DC

## 2015-07-14 MED ORDER — MUPIROCIN 2 % EX OINT: 1.0000 "application " | TOPICAL_OINTMENT | Freq: Two times a day (BID) | CUTANEOUS | Status: DC

## 2015-07-14 NOTE — ED Provider Notes (Signed)
CSN: [id number]     Arrival date & time 07/14/15  1723 History   First MD Initiated Contact with Patient 07/14/15 1921     Chief Complaint  Patient presents with  . Rash  . Possible Pregnancy   (Consider location/radiation/quality/duration/timing/severity/associated sxs/prior Treatment) HPI   Rash. Right arm. Ongoing for 1 month. Itchy. [place] without improvement. Constant. Getting worse. Patient is in the same bed as her boyfriend who does not have any rash or suspicious lesions. Few pustules. Denies fevers, nausea, vomiting, neck pain, headache.  Patient essentially active without condoms states that she's not actively trying to get pregnant but is okay with being pregnant. Last period was on 07/06/2015.  History reviewed. No pertinent past medical history. History reviewed. No pertinent past surgical history. Family History  Problem Relation Age of Onset  . Cancer Neg Hx   . Diabetes Neg Hx   . Heart failure Neg Hx   . Hyperlipidemia Neg Hx    Social History  Substance Use Topics  . Smoking status: Former Smoker -- 0.00 packs/day  . Smokeless tobacco: None  . Alcohol Use: No   OB History    No data available     Review of Systems Per HPI with all other pertinent systems negative.   Allergies  Latex  Home Medications   Prior to Admission medications   Medication Sig Start Date End Date Taking? Authorizing Provider  HYDROcodone-acetaminophen (NORCO/VICODIN) 5-325 MG per tablet Take 1 tablet by mouth every 4 (four) hours as needed. 04/18/14   Tiffany Carlota Raspberry, PA-C  ibuprofen (ADVIL,MOTRIN) 800 MG tablet Take 1 tablet (800 mg total) by mouth every 8 (eight) hours as needed for mild pain. 12/09/14   Kristen N Ward, DO  mupirocin ointment (BACTROBAN) 2 % Apply 1 application topically 2 (two) times daily. 07/14/15   Waldemar Dickens, MD  nystatin-triamcinolone ointment [facility]) Apply 1 application topically 2 (two) times daily. 07/14/15   Waldemar Dickens, MD  promethazine  (PHENERGAN) 25 MG tablet Take 1 tablet (25 mg total) by mouth every 6 (six) hours as needed for nausea or vomiting. 04/18/14   Delos Haring, PA-C   Meds Ordered and Administered this Visit  Medications - No data to display  BP 112/86 mmHg  Pulse 75  Temp(Src) 98.4 F (36.9 C) (Oral)  Resp 16  SpO2 99%  LMP 07/06/2015 (Exact Date) No data found.   Physical Exam Physical Exam  Constitutional: oriented to person, place, and time. appears well-developed and well-nourished. No distress.  HENT:  Head: Normocephalic and atraumatic.  Eyes: EOMI. PERRL.  Neck: Normal range of motion.  Cardiovascular: RRR, no m/r/g, 2+ distal pulses,  Pulmonary/Chest: Effort normal and breath sounds normal. No respiratory distress.  Abdominal: Soft. Bowel sounds are normal. NonTTP, no distension.  Musculoskeletal: Normal range of motion. Non ttp, no effusion.  Neurological: alert and oriented to person, place, and time.  Skin: Dorsum of right forearm with acute areas of papular patches with few pustules. No significant induration.[name]  Psychiatric: normal mood and affect. behavior is normal. Judgment and thought content normal.   ED Course  Procedures (including critical care time)  Labs Review Labs Reviewed  POCT PREGNANCY, URINE    Imaging Review No results found.   Visual Acuity Review  Right Eye Distance:   Left Eye Distance:   Bilateral Distance:    Right Eye Near:   Left Eye Near:    Bilateral Near:         MDM  1. Rash    Suspect staph or strep overgrowth of the skin causing mild folliculitis. Mupirocin ointment. May also be due to underlying fungal infection and will prescribe Mycolog. Patient's pregnancy test is negative. Contraceptive Management.  Waldemar Dickens, MD 07/14/15 [phone]

## 2015-07-14 NOTE — Discharge Instructions (Signed)
The cause of the rashes not immediately clear but is likely due to an overgrowth of bacteria. Please start the repair seen ointment as prescribed. If this does not work please use the other ointment which is both a antifungal and steroid. Please follow-up at the family practice center if this is not improving.

## 2015-07-14 NOTE — ED Notes (Signed)
Pt has had a rash on her right arm for about one month.  She states she gets it every time she is around this one particular person.  She also complains of abdominal cramping and wants to test for pregnancy.

## 2015-10-12 ENCOUNTER — Encounter (HOSPITAL_COMMUNITY): Payer: Self-pay | Admitting: Nurse Practitioner

## 2015-10-12 ENCOUNTER — Emergency Department (HOSPITAL_COMMUNITY)
Admission: EM | Admit: 2015-10-12 | Discharge: 2015-10-12 | Disposition: A | Discharge status: Home / Self Care | Payer: Self-pay | Attending: Emergency Medicine | Admitting: Emergency Medicine

## 2015-10-12 ENCOUNTER — Emergency Department (HOSPITAL_COMMUNITY): Payer: Medicaid Other

## 2015-10-12 DIAGNOSIS — L03116 Cellulitis of left lower limb: Secondary | ICD-10-CM | POA: Insufficient documentation

## 2015-10-12 DIAGNOSIS — L02414 Cutaneous abscess of left upper limb: Secondary | ICD-10-CM | POA: Insufficient documentation

## 2015-10-12 DIAGNOSIS — Z91040 Latex allergy status: Secondary | ICD-10-CM | POA: Insufficient documentation

## 2015-10-12 DIAGNOSIS — Z792 Long term (current) use of antibiotics: Secondary | ICD-10-CM | POA: Insufficient documentation

## 2015-10-12 DIAGNOSIS — L02416 Cutaneous abscess of left lower limb: Secondary | ICD-10-CM

## 2015-10-12 DIAGNOSIS — L0291 Cutaneous abscess, unspecified: Secondary | ICD-10-CM

## 2015-10-12 DIAGNOSIS — Z79899 Other long term (current) drug therapy: Secondary | ICD-10-CM | POA: Insufficient documentation

## 2015-10-12 DIAGNOSIS — F17210 Nicotine dependence, cigarettes, uncomplicated: Secondary | ICD-10-CM | POA: Insufficient documentation

## 2015-10-12 MED ORDER — HYDROMORPHONE HCL 1 MG/ML IJ SOLN
1.0000 mg | Freq: Once | INTRAMUSCULAR | Status: AC
  Administered 2015-10-12: 1 mg via INTRAMUSCULAR
  Filled 2015-10-12: qty 1

## 2015-10-12 MED ORDER — OXYCODONE-ACETAMINOPHEN 5-325 MG PO TABS
1.0000 | ORAL_TABLET | Freq: Once | ORAL | Status: AC
  Administered 2015-10-12: 1 via ORAL
  Filled 2015-10-12: qty 1

## 2015-10-12 MED ORDER — HYDROCODONE-ACETAMINOPHEN 5-325 MG PO TABS: 1.0000 | ORAL_TABLET | ORAL | Status: DC | PRN

## 2015-10-12 MED ORDER — SODIUM BICARBONATE 4 % IV SOLN
5.0000 mL | Freq: Once | INTRAVENOUS | Status: AC
  Administered 2015-10-12: 5 mL via INTRAVENOUS
  Filled 2015-10-12: qty 5

## 2015-10-12 MED ORDER — LIDOCAINE HCL 2 % IJ SOLN
10.0000 mL | Freq: Once | INTRAMUSCULAR | Status: AC
  Administered 2015-10-12: 200 mg via INTRADERMAL
  Filled 2015-10-12: qty 20

## 2015-10-12 MED ORDER — CLINDAMYCIN HCL 150 MG PO CAPS: 300.0000 mg | ORAL_CAPSULE | Freq: Three times a day (TID) | ORAL | Status: DC

## 2015-10-12 MED ORDER — CLINDAMYCIN HCL 150 MG PO CAPS
300.0000 mg | ORAL_CAPSULE | Freq: Once | ORAL | Status: AC
  Administered 2015-10-12: 300 mg via ORAL
  Filled 2015-10-12: qty 2

## 2015-10-12 NOTE — ED Provider Notes (Signed)
CSN: [id number]     Arrival date & time 10/12/15  2037 History  By signing my name below, I, Helane Gunther, attest that this documentation has been prepared under the direction and in the presence of [employer], PA-C. Electronically Signed: Helane Gunther, ED Scribe. 10/12/2015. 10:36 PM.     Chief Complaint  Patient presents with  . Skin Problem   The history is provided by the patient. No language interpreter was used.   HPI Comments: Virginia Cardenas is a 29 y.o. female who presents to the Emergency Department complaining of a painful, worsening abscess to the left inner thigh onset 2 days ago. She states this began as a small lump that grew increasingly large and painful. She notes she has tried to drain the abscess without success. She reports associated pain and generalized numbness to the entire left leg. She notes she has had an abscess on her back before, which required I&D at the time. She notes she is currently menstruating. She denies a PMHx of DM. Pt denies fever and n/v.   History reviewed. No pertinent past medical history. History reviewed. No pertinent past surgical history. Family History  Problem Relation Age of Onset  . Cancer Neg Hx   . Diabetes Neg Hx   . Heart failure Neg Hx   . Hyperlipidemia Neg Hx    Social History  Substance Use Topics  . Smoking status: Current Every Day Smoker -- 0.00 packs/day    Types: Cigarettes  . Smokeless tobacco: None  . Alcohol Use: Yes   OB History    No data available     Review of Systems  Constitutional: Negative for fever and chills.  Gastrointestinal: Negative for nausea and vomiting.  Musculoskeletal: Positive for myalgias.  Skin: Positive for color change and wound.    Allergies  Latex  Home Medications   Prior to Admission medications   Medication Sig Start Date End Date Taking? Authorizing Provider  HYDROcodone-acetaminophen (NORCO/VICODIN) 5-325 MG per tablet Take 1 tablet by mouth every 4 (four)  hours as needed. 04/18/14   Tiffany Carlota Raspberry, PA-C  ibuprofen (ADVIL,MOTRIN) 800 MG tablet Take 1 tablet (800 mg total) by mouth every 8 (eight) hours as needed for mild pain. 12/09/14   Kristen N Ward, DO  mupirocin ointment (BACTROBAN) 2 % Apply 1 application topically 2 (two) times daily. 07/14/15   Waldemar Dickens, MD  nystatin-triamcinolone ointment [facility]) Apply 1 application topically 2 (two) times daily. 07/14/15   Waldemar Dickens, MD  promethazine (PHENERGAN) 25 MG tablet Take 1 tablet (25 mg total) by mouth every 6 (six) hours as needed for nausea or vomiting. 04/18/14   Tiffany Carlota Raspberry, PA-C   BP 106/81 mmHg  Pulse 98  Temp(Src) 98.5 F (36.9 C) (Oral)  Resp 18  SpO2 100% Physical Exam  Constitutional: She appears well-developed and well-nourished.  HENT:  Head: Normocephalic and atraumatic.  Eyes: Conjunctivae are normal. Right eye exhibits no discharge. Left eye exhibits no discharge.  Pulmonary/Chest: Effort normal. No respiratory distress.  Neurological: She is alert. Coordination normal.  Skin: Skin is warm and dry. No rash noted. She is not diaphoretic. No erythema.  There is an 8 x 12cm area of induration, erythema, tenderness to the left inner proximal thigh. There is central area of fluctuance. No drainage.  Psychiatric: She has a normal mood and affect.  Nursing note and vitals reviewed.   ED Course  Procedures  DIAGNOSTIC STUDIES: Oxygen Saturation is 100% on RA, normal by  my interpretation.    COORDINATION OF CARE: 8:59 PM - Discussed plans to consult with attending. Will order pain medication. Pt advised of plan for treatment and pt agrees.  INCISION AND DRAINAGE PROCEDURE NOTE: Patient identification was confirmed and verbal consent was obtained. This procedure was performed by Jeannett Senior, PA-C at 10:36 PM. Site: Left Inner Thigh  Sterile procedures observed Needle size: 25 Anesthetic used (type and amt): Lidocaine 2% injection 200 mg, Sodium  Bicarbonate 4% injection 5 mL, Hydromorphone injection 1 mg Blade size: 11 Drainage: large purulent Complexity: Complex Packing used 1/4in Site anesthetized, incision made over site, wound drained and explored loculations, rinsed with copious amounts of normal saline, wound packed with sterile gauze, covered with dry, sterile dressing.  Pt tolerated procedure well without complications.  Instructions for care discussed verbally and pt provided with additional written instructions for homecare and f/u.   Labs Review Labs Reviewed - No data to display  Imaging Review [place] Misc Soft Tissue  10/12/2015  CLINICAL DATA:  29 year old female with left thigh abscess. EXAM: SOFT TISSUE ULTRASOUND - MISCELLANEOUS TECHNIQUE: Targeted sonographic images of the inner aspect of the left upper thigh in the region of the clinical concern was performed using grayscale and color ultrasound. COMPARISON:  None FINDINGS: There is a a 2.1 x 1.1 x 1.5 cm complex fluid collection in the superficial soft tissues of the inner aspect of the upper thigh. A curvilinear echogenic structure within this collection may represent a foreign object, gas, or calcified tissue. Clinical correlation is recommended. No flow is identified within this collection. IMPRESSION: Complex fluid Collection in the superficial soft tissues of the inner aspect of the left upper thigh compatible with an abscess. Follow-up recommended. Electronically Signed   By: Anner Crete M.D.   On: 10/12/2015 22:10   I have personally reviewed and evaluated these images as part of my medical decision-making.   EKG Interpretation None      MDM   Final diagnoses:  Abscess  Abscess of left thigh  Cellulitis of left thigh     patient with a large area of induration, most likely underlying abscess to the left medial thigh. Patient is very Will get ultrasound to evaluate how extensive this abscesses.  Ultrasound report shows 2 x 1 x 1.5 cm complex  fluid collection in the superficial tissue of the left inner thigh. I incised and drained abscess, with large purulent drainage. Packed. We'll discharge home with clindamycin. First dose given in emergency department. We'll discharge home with close outpatient follow-up in 2 days. Return precautions discussed and instructed patient to return if it is worsening. Patient is afebrile nontoxic appearing.  Filed Vitals:   10/12/15 2045 10/12/15 2319  BP: 106/81 119/84  Pulse: 98 79  Temp: 98.5 F (36.9 C) 98.1 F (36.7 C)  TempSrc: Oral Oral  Resp: 18 20  SpO2: 100% 100%     I personally performed the services described in this documentation, which was scribed in my presence. The recorded information has been reviewed and is accurate.    Jeannett Senior, PA-C 10/13/15 0006  Wandra Arthurs, MD 10/14/15 [phone]

## 2015-10-12 NOTE — Discharge Instructions (Signed)
Take ibuprofen or Tylenol for pain. Take Vicodin as prescribed as needed for severe pain. Warm compresses to the thigh. Please follow-up with your doctor in 2 days for recheck and packing removal. Please return sooner if there is worsening swelling, redness, pain.   Abscess An abscess is an infected area that contains a collection of pus and debris.It can occur in almost any part of the body. An abscess is also known as a furuncle or boil. CAUSES  An abscess occurs when tissue gets infected. This can occur from blockage of oil or sweat glands, infection of hair follicles, or a minor injury to the skin. As the body tries to fight the infection, pus collects in the area and creates pressure under the skin. This pressure causes pain. People with weakened immune systems have difficulty fighting infections and get certain abscesses more often.  SYMPTOMS Usually an abscess develops on the skin and becomes a painful mass that is red, warm, and tender. If the abscess forms under the skin, you may feel a moveable soft area under the skin. Some abscesses break open (rupture) on their own, but most will continue to get worse without care. The infection can spread deeper into the body and eventually into the bloodstream, causing you to feel ill.  DIAGNOSIS  Your caregiver will take your medical history and perform a physical exam. A sample of fluid may also be taken from the abscess to determine what is causing your infection. TREATMENT  Your caregiver may prescribe antibiotic medicines to fight the infection. However, taking antibiotics alone usually does not cure an abscess. Your caregiver may need to make a small cut (incision) in the abscess to drain the pus. In some cases, gauze is packed into the abscess to reduce pain and to continue draining the area. HOME CARE INSTRUCTIONS   Only take over-the-counter or prescription medicines for pain, discomfort, or fever as directed by your caregiver.  If you were  prescribed antibiotics, take them as directed. Finish them even if you start to feel better.  If gauze is used, follow your caregiver's directions for changing the gauze.  To avoid spreading the infection:  Keep your draining abscess covered with a bandage.  Wash your hands well.  Do not share personal care items, towels, or whirlpools with others.  Avoid skin contact with others.  Keep your skin and clothes clean around the abscess.  Keep all follow-up appointments as directed by your caregiver. SEEK MEDICAL CARE IF:   You have increased pain, swelling, redness, fluid drainage, or bleeding.  You have muscle aches, chills, or a general ill feeling.  You have a fever. MAKE SURE YOU:   Understand these instructions.  Will watch your condition.  Will get help right away if you are not doing well or get worse.   This information is not intended to replace advice given to you by your health care provider. Make sure you discuss any questions you have with your health care provider.   Document Released: 07/14/2005 Document Revised: 04/04/2012 Document Reviewed: 12/17/2011 Elsevier Interactive Patient Education 2016 Elsevier Inc[employer]  Cellulitis Cellulitis is an infection of the skin and the tissue beneath it. The infected area is usually red and tender. Cellulitis occurs most often in the arms and lower legs.  CAUSES  Cellulitis is caused by bacteria that enter the skin through cracks or cuts in the skin. The most common types of bacteria that cause cellulitis are staphylococci and streptococci. SIGNS AND SYMPTOMS   Redness  and warmth.  Swelling.  Tenderness or pain.  Fever. DIAGNOSIS  Your health care provider can usually determine what is wrong based on a physical exam. Blood tests may also be done. TREATMENT  Treatment usually involves taking an antibiotic medicine. HOME CARE INSTRUCTIONS   Take your antibiotic medicine as directed by your health care provider. Finish  the antibiotic even if you start to feel better.  Keep the infected arm or leg elevated to reduce swelling.  Apply a warm cloth to the affected area up to 4 times per day to relieve pain.  Take medicines only as directed by your health care provider.  Keep all follow-up visits as directed by your health care provider. SEEK MEDICAL CARE IF:   You notice red streaks coming from the infected area.  Your red area gets larger or turns dark in color.  Your bone or joint underneath the infected area becomes painful after the skin has healed.  Your infection returns in the same area or another area.  You notice a swollen bump in the infected area.  You develop new symptoms.  You have a fever. SEEK IMMEDIATE MEDICAL CARE IF:   You feel very sleepy.  You develop vomiting or diarrhea.  You have a general ill feeling (malaise) with muscle aches and pains.   This information is not intended to replace advice given to you by your health care provider. Make sure you discuss any questions you have with your health care provider.   Document Released: 07/14/2005 Document Revised: 06/25/2015 Document Reviewed: 12/20/2011 Elsevier Interactive Patient Education [employer].

## 2015-10-12 NOTE — ED Notes (Signed)
Pt c/o onset painful abscess to L inner thigh yesterday, no drainage. She has been cleaning with alcohol. Pain and sweling worse today.

## 2015-10-21 ENCOUNTER — Ambulatory Visit: Payer: Medicaid Other | Admitting: Family Medicine

## 2015-12-03 ENCOUNTER — Ambulatory Visit (INDEPENDENT_AMBULATORY_CARE_PROVIDER_SITE_OTHER): Payer: Self-pay | Admitting: Family Medicine

## 2015-12-03 ENCOUNTER — Emergency Department (HOSPITAL_COMMUNITY): Payer: Self-pay

## 2015-12-03 ENCOUNTER — Encounter: Payer: Self-pay | Admitting: Family Medicine

## 2015-12-03 ENCOUNTER — Emergency Department (HOSPITAL_COMMUNITY)
Admission: EM | Admit: 2015-12-03 | Discharge: 2015-12-03 | Disposition: A | Discharge status: Home / Self Care | Payer: Self-pay | Attending: Emergency Medicine | Admitting: Emergency Medicine

## 2015-12-03 ENCOUNTER — Encounter (HOSPITAL_COMMUNITY): Payer: Self-pay | Admitting: Family Medicine

## 2015-12-03 VITALS — BP 123/65 | HR 76 | Temp 97.6°F | Wt 238.0 lb

## 2015-12-03 DIAGNOSIS — F17210 Nicotine dependence, cigarettes, uncomplicated: Secondary | ICD-10-CM | POA: Insufficient documentation

## 2015-12-03 DIAGNOSIS — Z91040 Latex allergy status: Secondary | ICD-10-CM | POA: Insufficient documentation

## 2015-12-03 DIAGNOSIS — Z7952 Long term (current) use of systemic steroids: Secondary | ICD-10-CM | POA: Insufficient documentation

## 2015-12-03 DIAGNOSIS — Z3202 Encounter for pregnancy test, result negative: Secondary | ICD-10-CM | POA: Insufficient documentation

## 2015-12-03 DIAGNOSIS — R197 Diarrhea, unspecified: Secondary | ICD-10-CM | POA: Insufficient documentation

## 2015-12-03 DIAGNOSIS — Z792 Long term (current) use of antibiotics: Secondary | ICD-10-CM | POA: Insufficient documentation

## 2015-12-03 DIAGNOSIS — R109 Unspecified abdominal pain: Secondary | ICD-10-CM

## 2015-12-03 LAB — COMPREHENSIVE METABOLIC PANEL
ALBUMIN: 3.9 g/dL (ref 3.5–5.0)
ALT: 34 U/L (ref 14–54)
ANION GAP: 7 (ref 5–15)
AST: 34 U/L (ref 15–41)
Alkaline Phosphatase: 69 U/L (ref 38–126)
BILIRUBIN TOTAL: 0.6 mg/dL (ref 0.3–1.2)
BUN: 8 mg/dL (ref 6–20)
CHLORIDE: 109 mmol/L (ref 101–111)
CO2: 25 mmol/L (ref 22–32)
Calcium: 9.5 mg/dL (ref 8.9–10.3)
Creatinine, Ser: 0.77 mg/dL (ref 0.44–1.00)
GFR calc Af Amer: >60 mL/min (ref >60)
GFR calc non Af Amer: >60 mL/min (ref >60)
Glucose, Bld: 97 mg/dL (ref 65–99)
Potassium: 4.3 mmol/L (ref 3.5–5.1)
Sodium: 141 mmol/L (ref 135–145)
TOTAL PROTEIN: 7.7 g/dL (ref 6.5–8.1)

## 2015-12-03 LAB — URINALYSIS, ROUTINE W REFLEX MICROSCOPIC
BILIRUBIN URINE: NEGATIVE
Glucose, UA: NEGATIVE mg/dL
Hgb urine dipstick: NEGATIVE
Ketones, ur: NEGATIVE mg/dL
Leukocytes, UA: NEGATIVE
NITRITE: NEGATIVE
PH: 6 (ref 5.0–8.0)
Protein, ur: NEGATIVE mg/dL
SPECIFIC GRAVITY, URINE: 1.013 (ref 1.005–1.030)

## 2015-12-03 LAB — CBC
HEMATOCRIT: 38.9 % (ref 36.0–46.0)
Hemoglobin: 13.2 g/dL (ref 12.0–15.0)
MCH: 30 pg (ref 26.0–34.0)
MCHC: 33.9 g/dL (ref 30.0–36.0)
MCV: 88.4 fL (ref 78.0–100.0)
Platelets: 330 10*3/uL (ref 150–400)
RBC: 4.4 MIL/uL (ref 3.87–5.11)
RDW: 14.3 % (ref 11.5–15.5)
WBC: 6 10*3/uL (ref 4.0–10.5)

## 2015-12-03 LAB — I-STAT BETA HCG BLOOD, ED (MC, WL, AP ONLY)

## 2015-12-03 LAB — POCT URINALYSIS DIPSTICK
Bilirubin, UA: NEGATIVE
Blood, UA: NEGATIVE
GLUCOSE UA: NEGATIVE
Ketones, UA: NEGATIVE
LEUKOCYTES UA: NEGATIVE
Nitrite, UA: NEGATIVE
PROTEIN UA: NEGATIVE
Spec Grav, UA: >=1.03
UROBILINOGEN UA: 0.2
pH, UA: 6

## 2015-12-03 LAB — LIPASE, BLOOD: Lipase: 32 U/L (ref 11–51)

## 2015-12-03 LAB — POCT URINE PREGNANCY: PREG TEST UR: NEGATIVE

## 2015-12-03 MED ORDER — KETOROLAC TROMETHAMINE 30 MG/ML IJ SOLN
30.0000 mg | Freq: Once | INTRAMUSCULAR | Status: AC
  Administered 2015-12-03: 30 mg via INTRAVENOUS
  Filled 2015-12-03: qty 1

## 2015-12-03 MED ORDER — ONDANSETRON HCL 4 MG/2ML IJ SOLN
4.0000 mg | Freq: Once | INTRAMUSCULAR | Status: AC
  Administered 2015-12-03: 4 mg via INTRAVENOUS
  Filled 2015-12-03: qty 2

## 2015-12-03 MED ORDER — MORPHINE SULFATE (PF) 4 MG/ML IV SOLN
4.0000 mg | Freq: Once | INTRAVENOUS | Status: AC
  Administered 2015-12-03: 4 mg via INTRAVENOUS
  Filled 2015-12-03: qty 1

## 2015-12-03 MED ORDER — TRAMADOL HCL 50 MG PO TABS: 50.0000 mg | ORAL_TABLET | Freq: Four times a day (QID) | ORAL | Status: DC | PRN

## 2015-12-03 MED ORDER — SODIUM CHLORIDE 0.9 % IV BOLUS (SEPSIS)
1000.0000 mL | Freq: Once | INTRAVENOUS | Status: AC
  Administered 2015-12-03: 1000 mL via INTRAVENOUS

## 2015-12-03 NOTE — ED Provider Notes (Signed)
CSN: [id number]     Arrival date & time 12/03/15  1041 History   First MD Initiated Contact with Patient 12/03/15 1417     Chief Complaint  Patient presents with  . Flank Pain  . Diarrhea     (Consider location/radiation/quality/duration/timing/severity/associated sxs/prior Treatment) HPI Comments: Patient is a 30 year old female with no significant past medical history. She presents for evaluation of right flank pain that started abruptly [place] morning. She reports some frequent tools that are loose, but are nonbloody. She denies any urinary complaints. She denies any vaginal bleeding or vaginal discharge. Her pain is worse with movement and palpation.  Patient is a 30 y.o. female presenting with flank pain. The history is provided by the patient.  Flank Pain This is a new problem. The current episode started 1 to 2 hours ago. The problem occurs constantly. Nothing aggravates the symptoms. Nothing relieves the symptoms. She has tried nothing for the symptoms. The treatment provided no relief.    History reviewed. No pertinent past medical history. History reviewed. No pertinent past surgical history. Family History  Problem Relation Age of Onset  . Cancer Neg Hx   . Diabetes Neg Hx   . Heart failure Neg Hx   . Hyperlipidemia Neg Hx    Social History  Substance Use Topics  . Smoking status: Current Every Day Smoker -- 0.00 packs/day    Types: Cigarettes  . Smokeless tobacco: None  . Alcohol Use: Yes   OB History    No data available     Review of Systems  Genitourinary: Positive for flank pain.  All other systems reviewed and are negative.     Allergies  Latex  Home Medications   Prior to Admission medications   Medication Sig Start Date End Date Taking? Authorizing Provider  clindamycin (CLEOCIN) 150 MG capsule Take 2 capsules (300 mg total) by mouth 3 (three) times daily. 10/12/15   Tatyana Kirichenko, PA-C  HYDROcodone-acetaminophen (NORCO) 5-325 MG tablet Take  1-2 tablets by mouth every 4 (four) hours as needed for moderate pain. 10/12/15   Tatyana Kirichenko, PA-C  ibuprofen (ADVIL,MOTRIN) 800 MG tablet Take 1 tablet (800 mg total) by mouth every 8 (eight) hours as needed for mild pain. 12/09/14   Kristen N Ward, DO  mupirocin ointment (BACTROBAN) 2 % Apply 1 application topically 2 (two) times daily. 07/14/15   Waldemar Dickens, MD  nystatin-triamcinolone ointment [facility]) Apply 1 application topically 2 (two) times daily. 07/14/15   Waldemar Dickens, MD  promethazine (PHENERGAN) 25 MG tablet Take 1 tablet (25 mg total) by mouth every 6 (six) hours as needed for nausea or vomiting. 04/18/14   Tiffany Carlota Raspberry, PA-C   BP 148/111 mmHg  Pulse 78  Temp(Src) 97.9 F (36.6 C) (Oral)  Resp 22  SpO2 98% Physical Exam  Constitutional: She is oriented to person, place, and time. She appears well-developed and well-nourished. No distress.  HENT:  Head: Normocephalic and atraumatic.  Neck: Normal range of motion. Neck supple.  Cardiovascular: Normal rate, regular rhythm and normal heart sounds.   No murmur heard. Pulmonary/Chest: Effort normal and breath sounds normal. No respiratory distress. She has no wheezes.  Abdominal: Soft. Bowel sounds are normal. She exhibits no distension. There is no tenderness. There is no rebound.  There is tenderness to palpation in the right flank.  Musculoskeletal: Normal range of motion. She exhibits no edema.  Neurological: She is alert and oriented to person, place, and time.  Skin: Skin is warm and  dry. She is not diaphoretic.  Nursing note and vitals reviewed.   ED Course  Procedures (including critical care time) Labs Review Labs Reviewed  LIPASE, BLOOD  COMPREHENSIVE METABOLIC PANEL  CBC  URINALYSIS, ROUTINE W REFLEX MICROSCOPIC (NOT AT [facility])  I-STAT BETA HCG BLOOD, ED ([facility], [facility], [facility] ONLY)    Imaging Review Ct Renal Stone Study  12/03/2015  CLINICAL DATA:  RIGHT flank pain for several weeks. Worsening pain today.  EXAM: CT ABDOMEN AND PELVIS WITHOUT CONTRAST TECHNIQUE: Multidetector CT imaging of the abdomen and pelvis was performed following the standard protocol without IV contrast. COMPARISON:  CT 8 14 12  FINDINGS: Lower chest: Ground-glass opacity in cystic change in the LEFT lower lobe. There is subpleural nodule measuring 4 mm on image 10, series 205. Smaller nodule posterior to the oblique fissure in LEFT lower lobe on image 4. New Hepatobiliary: No focal hepatic lesions noncontrast exam. Gallbladder normal. Pancreas: Pancreas is normal. No ductal dilatation. No pancreatic inflammation. Spleen: Normal spleen Adrenals/urinary tract: Adrenal glands and kidneys are normal. The ureters and bladder normal. Stomach/Bowel: Stomach is normal. Motion degradation limits evaluation of the bowel. No gross abnormality. Appendix not identified or well evaluated. Vascular/Lymphatic: Abdominal aorta is normal caliber. There is no retroperitoneal or periportal lymphadenopathy. No pelvic lymphadenopathy. Reproductive: Uterus and ovaries are normal. Other: Chronic bilateral inguinal lymph nodes purple limits of normal. Musculoskeletal: No aggressive osseous lesion. IMPRESSION: 1. Motion degradation limits evaluation of the bowel and mid ureters. 2. No nephrolithiasis or obstructive uropathy. 3. No acute findings evident in the abdomen or pelvis 4. Prominent inguinal lymph nodes. 5. Ground-glass opacity in the LEFT lower lobe with mild nodularity favors a infectious or inflammatory process. Electronically Signed   By: Suzy Bouchard M.D.   On: 12/03/2015 15:40   I have personally reviewed and evaluated these images and lab results as part of my medical decision-making.   EKG Interpretation None      MDM   Final diagnoses:  None    CT renal is negative and laboratory studies are unremarkable. Her pregnancy test is negative. There is no evidence for renal calculus, appendicitis, or gallstones. I see nothing that appears  emergent. A urinalysis is pending at this time. This will be obtained and if negative, the patient will be discharged to home with the diagnosis of musculoskeletal flank pain, treated with pain medication and when necessary return.    Veryl Speak, MD 12/03/15 [phone]

## 2015-12-03 NOTE — ED Notes (Signed)
Pt hre for right side pain that has been intermittent. sts some diarrhea. sts also her face is swollen.

## 2015-12-03 NOTE — ED Provider Notes (Signed)
Urinalysis negative.  Patient to be discharged per plan established by Dr. Stark Jock.  Ezequiel Essex, MD 12/03/15 [phone]

## 2015-12-03 NOTE — Progress Notes (Addendum)
Date of Visit: 12/03/2015   HPI:  Patient presents for a same day appointment to discuss R flank pain. Pain has been present on and off for several weeks but acutely worsened last night. Has pain whenever she takes a deep breath. Pain is located inside her body, rather than on the skin. Has had diarrhea lately. No vomiting but has felt nauseated. Has a history of cesarean section but no prior appendectomy or other surgeries. No known fevers.   Also patient thinks her face is swollen and her jaw is hurting.  ROS: See HPI  [place]: history of acne  PHYSICAL EXAM: BP 123/65 mmHg  Pulse 76  Temp(Src) 97.6 F (36.4 C) (Oral)  Wt 238 lb (107.956 kg) Gen: lying on bed in exam room. Appears distressed. Unable to roll over onto her back due to reported excruciating pain in her R lower back.  HEENT: normocephalic, atraumatic. No facial swelling appreciated. Lungs: normal work of breathing.  Abdomen: exam limited by patient inability to lay supine. Overall abdomen is soft without masses. Unable to determine whether deep palpation of RLQ worsens pain due to patient writhing in pain on bed Neuro: alert, grossly nonfocal, speech normal  ASSESSMENT/PLAN:  1. R flank pain - urinalysis and urine preg negative, speaks against pyelo, UTI, urine pregnancy. Patient is exquisitely tender today and is unable to move around without assistance due to her pain. I think this acute onset of severe pain with accompanying diarrhea warrants urgent evaluation with labs and CT scan. While this may be musculoskeletal in etiology, differential diagnosis also includes appendicitis, retroperitoneal pathology, etc and I am unable to rule these out based on her examination today. Explained to patient that the quickest way to have this evaluation done is going to be to go to the ED. Patient and mother were agreeable. They were offered wheelchair transport to ED but chose to go via private vehicle. I attempted to call ED to inform  them of patient's pending arrival but no one answered when I called.   FOLLOW UP: Going directly to ED for further evaluation.  [place]. Ardelia Mems, [place]

## 2015-12-03 NOTE — ED Notes (Signed)
MD at bedside. 

## 2015-12-03 NOTE — ED Notes (Signed)
Pt ambulates independently and with steady gait at time of discharge. Discharge instructions and follow up information reviewed with patient. No other questions or concerns voiced at this time. RX x 1 given. 

## 2015-12-03 NOTE — Discharge Instructions (Signed)
Ibuprofen 600 mg every 6 hours as needed for pain. Tramadol as needed for pain not relieved with ibuprofen.  Return to the emergency department if symptoms significantly worsen or change.   Flank Pain Flank pain refers to pain that is located on the side of the body between the upper abdomen and the back. The pain may occur over a short period of time (acute) or may be long-term or reoccurring (chronic). It may be mild or severe. Flank pain can be caused by many things. CAUSES  Some of the more common causes of flank pain include:  Muscle strains.   Muscle spasms.   A disease of your spine (vertebral disk disease).   A lung infection (pneumonia).   Fluid around your lungs (pulmonary edema).   A kidney infection.   Kidney stones.   A very painful skin rash caused by the chickenpox virus (shingles).   Gallbladder disease.  [place] care will depend on the cause of your pain. In general,  Rest as directed by your caregiver.  Drink enough fluids to keep your urine clear or pale yellow.  Only take over-the-counter or prescription medicines as directed by your caregiver. Some medicines may help relieve the pain.  Tell your caregiver about any changes in your pain.  Follow up with your caregiver as directed. SEEK IMMEDIATE MEDICAL CARE IF:   Your pain is not controlled with medicine.   You have new or worsening symptoms.  Your pain increases.   You have abdominal pain.   You have shortness of breath.   You have persistent nausea or vomiting.   You have swelling in your abdomen.   You feel faint or pass out.   You have blood in your urine.  You have a fever or persistent symptoms for more than 2-3 days.  You have a fever and your symptoms suddenly get worse. MAKE SURE YOU:   Understand these instructions.  Will watch your condition.  Will get help right away if you are not doing well or get worse.   This information is  not intended to replace advice given to you by your health care provider. Make sure you discuss any questions you have with your health care provider.   Document Released: 11/25/2005 Document Revised: 06/28/2012 Document Reviewed: 05/18/2012 Elsevier Interactive Patient Education [employer].

## 2015-12-31 ENCOUNTER — Ambulatory Visit (INDEPENDENT_AMBULATORY_CARE_PROVIDER_SITE_OTHER): Payer: Self-pay | Admitting: Family Medicine

## 2015-12-31 VITALS — BP 113/59 | HR 88 | Temp 97.7°F | Ht 65.0 in | Wt 235.3 lb

## 2015-12-31 DIAGNOSIS — H109 Unspecified conjunctivitis: Secondary | ICD-10-CM

## 2015-12-31 MED ORDER — OFLOXACIN 0.3 % OP SOLN: 1.0000 [drp] | Freq: Four times a day (QID) | OPHTHALMIC | Status: DC

## 2015-12-31 NOTE — Progress Notes (Signed)
Subjective:    Virginia Cardenas is a 30 y.o. female who presents to [facility] today for Concern for pinkeye:  1.  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 She states that she was doing well in her usual state of health until he yesterday when she began to experience tearing and itching in right eye. States this progressed throughout the day. Has persisted and therefore she presented to care today. She states that she has had some continued clear drainage from right eye. She states that her eye was matted shut this morning with crust. Denies any actual eye pain. No sensation of foreign body, grittiness in eye. She does wear hard contacts. She is adamant that she takes these out every single night because they're too painful to sleep in. She also is concerned because they dry her eyes out at night.  Review of systems: No fevers or chills. No upper respiratory symptoms such as runny nose or cough. No sore throat.     PMH reviewed.  No past medical history on file. No past surgical history on file.  Medications reviewed. Current Outpatient Prescriptions  Medication Sig Dispense Refill  . clindamycin (CLEOCIN) 150 MG capsule Take 2 capsules (300 mg total) by mouth 3 (three) times daily. 62 capsule 0  . HYDROcodone-acetaminophen (NORCO) 5-325 MG tablet Take 1-2 tablets by mouth every 4 (four) hours as needed for moderate pain. 20 tablet 0  . ibuprofen (ADVIL,MOTRIN) 800 MG tablet Take 1 tablet (800 mg total) by mouth every 8 (eight) hours as needed for mild pain. 30 tablet 0  . mupirocin ointment (BACTROBAN) 2 % Apply 1 application topically 2 (two) times daily. 22 g 0  . nystatin-triamcinolone ointment (MYCOLOG) Apply 1 application topically 2 (two) times daily. 30 g 0  . promethazine (PHENERGAN) 25 MG tablet Take 1 tablet (25 mg total) by mouth every 6 (six) hours as needed for nausea or vomiting. 30 tablet 0  . traMADol (ULTRAM) 50 MG tablet Take  1 tablet (50 mg total) by mouth every 6 (six) hours as needed. 15 tablet 0   No current facility-administered medications for this visit.     Objective:   Physical Exam BP 113/59 mmHg  Pulse 88  Temp(Src) 97.7 F (36.5 C) (Oral)  Ht 5\' 5"  (1.651 m)  Wt 235 lb 4.8 oz (106.731 kg)  BMI 39.16 kg/m2 Gen:  Patient sitting on exam table, appears stated age in no acute distress Head: Normocephalic atraumatic Eyes: EOMI, PERRL.  Left eye without any scleral injection. Normal-appearing. Right eye with some mild scleral injection sparing limbus. No evidence of keratitis/ulcer noted on penlight or ophthalmic exam. She can keep right eye open well. She does have some clear drainage from eye when she blinks away. She tolerates the ophthalmic exam well without pain. Nose:  Nasal turbinates Normal bilaterally. Mouth: Mucosa membranes moist. Tonsils +2, nonenlarged, non-erythematous. Neck: No cervical lymphadenopathy noted     No results found for this or any previous visit (from the past 72 hour(s)).

## 2015-12-31 NOTE — Patient Instructions (Addendum)
Stop waering your contact lenses.    If you continue having discomfort in your eyes 24 hours after starting the antibiotic, call us immediately and we'll send you to an eye doctor.    Use the drops 4 times in your affected eye for the next 5 days.  You can also use over the counter saline/lubricant drops to help relieve the tears.

## 2016-01-01 NOTE — Assessment & Plan Note (Signed)
Most likely viral.  No red flags present on history or physical. However she does wear contacts. Likely overkill but treated with ofloxacin to ensure not secondary bacterial infection. Coupon printed for her for antibiotic. She is to call tomorrow if she begins having any eye pain.

## 2016-01-07 ENCOUNTER — Telehealth: Payer: Self-pay | Admitting: *Deleted

## 2016-01-07 ENCOUNTER — Encounter: Payer: Self-pay | Admitting: *Deleted

## 2016-01-07 ENCOUNTER — Telehealth: Payer: Self-pay | Admitting: Family Medicine

## 2016-01-07 MED ORDER — CROMOLYN SODIUM 4 % OP SOLN: 1.0000 [drp] | Freq: Three times a day (TID) | OPHTHALMIC | Status: DC | PRN

## 2016-01-07 NOTE — Telephone Encounter (Signed)
Patient walked into clinic today requesting to be seen by a provider for possible pink eye in both eyes.  Patient was seen on 12/31/2015 by PCP, antibiotic eye drops were sent into pharmacy for the right eye.  Patient stated now the irritation is in the left eye. Precept with Dr. Nori Riis; will send in a different eye drop, patient to stop current eyes and follow up in one week.  Appointment scheduled for 01/15/2016 at 8:45 AM.  Will forward to Dr. Nori Riis.  Derl Barrow, RN

## 2016-01-07 NOTE — Telephone Encounter (Signed)
Patient need a work note for visit on 3/15 [facility] when ready to pickup

## 2016-01-07 NOTE — Telephone Encounter (Signed)
Conjunctival irritation most c/w allergic conjunctivitis. She has allergic rhinitis. Eyes feel a little itchy. No visual problems. Wears contact--takes them out at night. Will try cromolyn opthalmic and have her f/u 1 week. Stop if drops painful or visual changes or worsening irritation. RTC immediately with significant worsening/ any visual changes.

## 2016-01-07 NOTE — Telephone Encounter (Signed)
Note printed. Pt informed. Pt stated she would pick it up when she comes for today's appt. Ottis Stain, CMA

## 2016-01-15 ENCOUNTER — Ambulatory Visit: Payer: Self-pay | Admitting: Family Medicine

## 2016-05-18 ENCOUNTER — Ambulatory Visit: Payer: Self-pay | Admitting: Family Medicine

## 2016-05-18 ENCOUNTER — Other Ambulatory Visit (HOSPITAL_COMMUNITY)
Admission: RE | Admit: 2016-05-18 | Discharge: 2016-05-18 | Disposition: A | Discharge status: Home / Self Care | Payer: Self-pay | Source: Ambulatory Visit | Attending: Family Medicine | Admitting: Family Medicine

## 2016-05-18 ENCOUNTER — Ambulatory Visit (INDEPENDENT_AMBULATORY_CARE_PROVIDER_SITE_OTHER): Payer: Self-pay | Admitting: Family Medicine

## 2016-05-18 ENCOUNTER — Encounter: Payer: Self-pay | Admitting: Family Medicine

## 2016-05-18 VITALS — BP 101/60 | HR 78 | Temp 98.2°F | Wt 234.0 lb

## 2016-05-18 DIAGNOSIS — Z113 Encounter for screening for infections with a predominantly sexual mode of transmission: Secondary | ICD-10-CM | POA: Insufficient documentation

## 2016-05-18 DIAGNOSIS — Z202 Contact with and (suspected) exposure to infections with a predominantly sexual mode of transmission: Secondary | ICD-10-CM

## 2016-05-18 DIAGNOSIS — S8992XA Unspecified injury of left lower leg, initial encounter: Secondary | ICD-10-CM

## 2016-05-18 DIAGNOSIS — N760 Acute vaginitis: Secondary | ICD-10-CM | POA: Insufficient documentation

## 2016-05-18 LAB — POCT WET PREP (WET MOUNT): Clue Cells Wet Prep Whiff POC: POSITIVE

## 2016-05-18 MED ORDER — CEFTRIAXONE SODIUM 1 G IJ SOLR
250.0000 mg | Freq: Once | INTRAMUSCULAR | Status: AC
  Administered 2016-05-18: 250 mg via INTRAMUSCULAR

## 2016-05-18 MED ORDER — DOXYCYCLINE HYCLATE 100 MG PO TABS: 100.0000 mg | ORAL_TABLET | Freq: Two times a day (BID) | ORAL | 0 refills | Status: DC

## 2016-05-18 NOTE — Assessment & Plan Note (Signed)
Wet prep was performed today and revealed trichomoniasis as well as actual vaginosis. This is consistent with her exam.  GC/chlamydia was sent out. HIV and RPR were not done today. We discussed abstaining from sexual intercourse until 7 days after her and her partner's completion of metronidazole.  -  Flagyl 500mg  BID x 7 days. Discussed disulfiram reaction.  - f/u GC/chlamydia - From reviewing the chart, the patient has had both gonorrhea and chlamydia several times in the past. I have empirically treated her with azithromycin 1 g by mouth in the clinic, ceftriaxone 250 mg IM and prescribed a course of doxycycline 100 mg twice a day for 7 days.
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
## 2016-05-19 LAB — CERVICOVAGINAL ANCILLARY ONLY
Chlamydia: NEGATIVE
NEISSERIA GONORRHEA: NEGATIVE
Trichomonas: POSITIVE — AB

## 2016-05-19 MED ORDER — AZITHROMYCIN 500 MG PO TABS: 500.0000 mg | ORAL_TABLET | Freq: Once | ORAL | Status: DC

## 2016-05-19 MED ORDER — AZITHROMYCIN 500 MG PO TABS
1000.0000 mg | ORAL_TABLET | Freq: Once | ORAL | Status: AC
  Administered 2016-05-19: 1000 mg via ORAL

## 2016-05-19 NOTE — Progress Notes (Signed)
Patient given azithromycin 1000g PO NDC: [id number]: [id number] Exp: 06/2018

## 2016-05-19 NOTE — Addendum Note (Signed)
Addended by: Levert Feinstein F on: 05/19/2016 11:58 AM   Modules accepted: Orders

## 2016-05-20 ENCOUNTER — Telehealth: Payer: Self-pay | Admitting: Family Medicine

## 2016-05-20 NOTE — Telephone Encounter (Signed)
Called patient to inform her of negative GC/chlamydia test. Advised her to f/u within the next few months to get a test of cure after her treatment for trichomoniasis.   Archie Patten, MD [facility] Family Medicine Resident  05/20/2016, 8:41 AM

## 2016-05-21 ENCOUNTER — Telehealth: Payer: Self-pay | Admitting: Family Medicine

## 2016-05-21 MED ORDER — METRONIDAZOLE 500 MG PO TABS: 500.0000 mg | ORAL_TABLET | Freq: Two times a day (BID) | ORAL | 0 refills | Status: DC

## 2016-05-21 NOTE — Telephone Encounter (Signed)
Pt called because she was prescribed Doxycycline on 05/18/16 and was told that it was $11.00. When she went to pick this up she was told it was $41.00. She would like to know if something cheaper can be called in or if we transfer the prescription to [facility] on [address] is it cheaper there. She is also requesting a refill on her Hydrocodone. Please inform patient so that she knows to pick up her medication and if it was sent somewhere else. jw

## 2016-05-21 NOTE — Telephone Encounter (Signed)
I called the patient yesterday and discussed the results. She NEEDS the metronidazole that should be around $15 with the coupon that I discussed, she does NOT need the doxycycline.  I initially prescribed this as she wanted empiric treatment for GC/chlamydia while she waited for the results.  She would like for the Rx for Flagyl to be sent to the [facility] instead of the [facility]. I sent this in.   Archie Patten, MD [facility] Family Medicine Resident  05/21/2016, 11:53 AM

## 2016-05-21 NOTE — Telephone Encounter (Signed)
Looks like patient got Ceftriaxone and azithro. Unless I am missing something she should be OK w/o the Doxy. That said, I am forwarding message to the prescribing physician to have her make the final decision.  As for the Hydrocodone. There is no way I will prescribe this unless I evaluate her in the office myself.

## 2016-05-27 ENCOUNTER — Telehealth: Payer: Self-pay | Admitting: Family Medicine

## 2016-05-27 NOTE — Telephone Encounter (Signed)
Pt would like a letter stating she is able to go back to work. Pt would like the letter ready today by noon. Please advise. Thanks! ep

## 2016-05-27 NOTE — Telephone Encounter (Signed)
Spoke with [facility] and the pt was supposed to see othro, or come be re-evaluated. Pt states she they wouldn't see her because she didn't have the money. [facility]sey or [facility] didn't have a opening early enough for the pt. She is scheduled to see dr Ardelia Mems Tuesday. Deseree Kennon Holter, CMA

## 2016-06-01 ENCOUNTER — Ambulatory Visit (INDEPENDENT_AMBULATORY_CARE_PROVIDER_SITE_OTHER): Payer: Self-pay | Admitting: Family Medicine

## 2016-06-01 ENCOUNTER — Encounter: Payer: Self-pay | Admitting: Family Medicine

## 2016-06-01 VITALS — BP 115/68 | HR 91 | Temp 98.3°F | Ht 65.0 in | Wt 227.4 lb

## 2016-06-01 DIAGNOSIS — T887XXA Unspecified adverse effect of drug or medicament, initial encounter: Secondary | ICD-10-CM

## 2016-06-01 DIAGNOSIS — T50905A Adverse effect of unspecified drugs, medicaments and biological substances, initial encounter: Secondary | ICD-10-CM

## 2016-06-01 DIAGNOSIS — M25562 Pain in left knee: Secondary | ICD-10-CM

## 2016-06-01 NOTE — Patient Instructions (Signed)
You can return to work with no restrictions Return if knee worsens or rash worsens  Be well, Dr. Ardelia Mems
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
## 2016-06-01 NOTE — Assessment & Plan Note (Signed)
Knee pain resolved. Exam normal today without any ligamental laxity or signs of meniscal injury. Letter written saying she can return to work with no restrictions. Follow up if knee pain returns or worsens.

## 2016-06-01 NOTE — Progress Notes (Addendum)
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 Khair Chasteen Kennon Holter, CMA

## 2016-07-11 ENCOUNTER — Ambulatory Visit (HOSPITAL_COMMUNITY)
Admission: EM | Admit: 2016-07-11 | Discharge: 2016-07-11 | Disposition: A | Discharge status: Home / Self Care | Payer: Medicaid Other | Attending: Internal Medicine | Admitting: Internal Medicine

## 2016-07-11 ENCOUNTER — Encounter (HOSPITAL_COMMUNITY): Payer: Self-pay | Admitting: Emergency Medicine

## 2016-07-11 DIAGNOSIS — M25562 Pain in left knee: Secondary | ICD-10-CM

## 2016-07-11 DIAGNOSIS — M545 Low back pain, unspecified: Secondary | ICD-10-CM

## 2016-07-11 MED ORDER — KETOROLAC TROMETHAMINE 60 MG/2ML IM SOLN
INTRAMUSCULAR | Status: AC
Start: 2016-07-11 — End: 2016-07-11
  Filled 2016-07-11: qty 2

## 2016-07-11 MED ORDER — CYCLOBENZAPRINE HCL 10 MG PO TABS: 10.0000 mg | ORAL_TABLET | Freq: Every evening | ORAL | 0 refills | Status: DC | PRN

## 2016-07-11 MED ORDER — KETOROLAC TROMETHAMINE 60 MG/2ML IM SOLN
60.0000 mg | Freq: Once | INTRAMUSCULAR | Status: AC
  Administered 2016-07-11: 60 mg via INTRAMUSCULAR

## 2016-07-11 MED ORDER — HYDROCODONE-ACETAMINOPHEN 5-325 MG PO TABS: 2.0000 | ORAL_TABLET | ORAL | 0 refills | Status: DC | PRN

## 2016-07-11 MED ORDER — NAPROXEN 500 MG PO TABS: 500.0000 mg | ORAL_TABLET | Freq: Two times a day (BID) | ORAL | 0 refills | Status: DC

## 2016-07-11 NOTE — Discharge Instructions (Addendum)
Followup with primary care provider, Georges Lynch for evaluation/management of persistent back/knee pain.
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
## 2016-11-08 ENCOUNTER — Ambulatory Visit (HOSPITAL_COMMUNITY)
Admission: EM | Admit: 2016-11-08 | Discharge: 2016-11-08 | Disposition: A | Discharge status: Home / Self Care | Payer: Medicaid Other | Attending: Family Medicine | Admitting: Family Medicine

## 2016-11-08 ENCOUNTER — Encounter (HOSPITAL_COMMUNITY): Payer: Self-pay | Admitting: Emergency Medicine

## 2016-11-08 ENCOUNTER — Telehealth (HOSPITAL_COMMUNITY): Payer: Self-pay | Admitting: Emergency Medicine

## 2016-11-08 DIAGNOSIS — B86 Scabies: Secondary | ICD-10-CM

## 2016-11-08 MED ORDER — IVERMECTIN 3 MG PO TABS: ORAL_TABLET | ORAL | 0 refills | Status: DC

## 2016-11-08 MED ORDER — HYDROXYZINE HCL 25 MG PO TABS: 25.0000 mg | ORAL_TABLET | Freq: Four times a day (QID) | ORAL | 0 refills | Status: DC

## 2016-11-08 MED ORDER — PERMETHRIN 5 % EX CREA: 1.0000 "application " | TOPICAL_CREAM | Freq: Once | CUTANEOUS | 0 refills | Status: AC

## 2016-11-08 NOTE — Discharge Instructions (Signed)
You have scabies, I have prescribed a medicine called Elimite, apply to the entire skin area from the chin down, leave 8-14 hours before washing. Avoid using other topical skin products. This may be repeated in 1-2 weeks as needed. I have also prescribed a medicine for itch called Hydroxyzine. Take 1 tablet every 6 hours, this will cause sedation. Do not drink or drive while taking this medicine. I have attached an informational handout for scabies as well.

## 2016-11-08 NOTE — ED Provider Notes (Signed)
CSN: [id number]     Arrival date & time 11/08/16  1011 History   None    Chief Complaint  Patient presents with  . Rash   (Consider location/radiation/quality/duration/timing/severity/associated sxs/prior Treatment) 31 year old female presents with 1 week history of itching, and rash over her entire body. The itching is worse at night, and after hot showers. She has no fever or systemic symptoms. She reports she works in [employer] and travels frequently and has been staying in [place].    The history is provided by the patient.  Rash  Associated symptoms: no fever     History reviewed. No pertinent past medical history. History reviewed. No pertinent surgical history. Family History  Problem Relation Age of Onset  . Cancer Neg Hx   . Diabetes Neg Hx   . Heart failure Neg Hx   . Hyperlipidemia Neg Hx    Social History  Substance Use Topics  . Smoking status: Current Every Day Smoker    Packs/day: 0.00    Types: Cigarettes  . Smokeless tobacco: Never Used  . Alcohol use Yes   OB History    No data available     Review of Systems  Constitutional: Negative for chills and fever.  Respiratory: Negative.   Cardiovascular: Negative.   Skin: Positive for color change and rash.  Neurological: Negative.     Allergies  Latex and Metronidazole  Home Medications   Prior to Admission medications   Medication Sig Start Date End Date Taking? Authorizing Provider  hydrOXYzine (ATARAX/VISTARIL) 25 MG tablet Take 1 tablet (25 mg total) by mouth every 6 (six) hours. 11/08/16   Barnet Glasgow, NP  permethrin (ELIMITE) 5 % cream Apply 1 application topically once. Apply to the entire skin, chin down, leave for 8-14 hours, may repeat in 1-2 weeks as needed. 11/08/16 11/08/16  Barnet Glasgow, NP   Meds Ordered and Administered this Visit  Medications - No data to display  BP 105/67 (BP Location: Left Arm)   Pulse 79   Temp 97.7 F (36.5 C) (Oral)   Resp 18   SpO2 100%  No  data found.   Physical Exam  Constitutional: She is oriented to person, place, and time. She appears well-developed. No distress.  HENT:  Head: Normocephalic and atraumatic.  Neurological: She is alert and oriented to person, place, and time.  Skin: Skin is warm and dry. Capillary refill takes less than 2 seconds. Rash noted. Rash is pustular. She is not diaphoretic.  Entire body surface covered in crusted, pruritic lesions in various states of healing, some scant burrows, and fissured plaques.   Psychiatric: She has a normal mood and affect.  Nursing note and vitals reviewed.   Urgent Care Course     Procedures (including critical care time)  Labs Review Labs Reviewed - No data to display  Imaging Review No results found.   Visual Acuity Review  Right Eye Distance:   Left Eye Distance:   Bilateral Distance:    Right Eye Near:   Left Eye Near:    Bilateral Near:         MDM   1. Scabies   You have scabies, I have prescribed a medicine called Elimite, apply to the entire skin area from the chin down, leave 8-14 hours before washing. Avoid using other topical skin products. This may be repeated in 1-2 weeks as needed. I have also prescribed a medicine for itch called Hydroxyzine. Take 1 tablet every 6 hours, this  will cause sedation. Do not drink or drive while taking this medicine. I have attached an informational handout for scabies as well.      Barnet Glasgow, NP 11/08/16 [phone]

## 2016-11-08 NOTE — Telephone Encounter (Signed)
Patient called to request a change in her prescription. Elemite cream changed to Ivermectin.

## 2016-11-08 NOTE — ED Triage Notes (Signed)
The patient presented to the [facility] with a complaint of a rash all over her body x 2 weeks.

## 2016-12-22 ENCOUNTER — Encounter: Payer: Self-pay | Admitting: Family Medicine

## 2016-12-22 ENCOUNTER — Ambulatory Visit (INDEPENDENT_AMBULATORY_CARE_PROVIDER_SITE_OTHER): Payer: Self-pay | Admitting: Family Medicine

## 2016-12-22 VITALS — BP 105/80 | HR 100 | Temp 97.5°F | Ht 65.0 in | Wt 218.8 lb

## 2016-12-22 DIAGNOSIS — L732 Hidradenitis suppurativa: Secondary | ICD-10-CM

## 2016-12-22 MED ORDER — KETOROLAC TROMETHAMINE 30 MG/ML IJ SOLN
30.0000 mg | Freq: Once | INTRAMUSCULAR | Status: AC
  Administered 2016-12-22: 30 mg via INTRAMUSCULAR

## 2016-12-22 MED ORDER — NAPROXEN 500 MG PO TABS: 500.0000 mg | ORAL_TABLET | Freq: Two times a day (BID) | ORAL | 0 refills | Status: DC

## 2016-12-22 MED ORDER — DOXYCYCLINE HYCLATE 100 MG PO TABS: 100.0000 mg | ORAL_TABLET | Freq: Two times a day (BID) | ORAL | 0 refills | Status: DC

## 2016-12-22 NOTE — Patient Instructions (Signed)
I have sent in an antibiotic that I want you to start today (doxycycline) and a pain/ inflammation medication (naproxen).  Take these medications with food.  Plan to come back for reevaluation by this Friday/ Monday or sooner if your symptoms are getting worse.  It appears that you have something called hidradenitis suppurativa.  This is a common skin condition where you are prone to infection/ abscess formation.    What YOU can do to decrease recurrence:  1. Wear loose, light clothing  2. Avoid excessive heat, friction, and shearing trauma (protect areas that rub together: thighs, underneath breasts, underneath belly) 3. Wash clothes in detergents that are free of perfumes & dyes (usually marketed as "free and clear") 4. Wash DAILY. Use a gentle, nonsoap cleanser and to wash gently with only your fingers. Scrubbing with washcloths, loofahs, or brushes causes trauma and irritation. If you feel like you have an odor, you can use an antibacterial soap like Dial. 5. If you smoke, STOP. 6. Weight loss.  This is probably the most important one of all if you are overweight.  Excess weight causes hormonal imbalance, insulin resistance and increased shearing forces on the skin.  These ALL lead to increased risk of having infection. 7. Avoid shaving 8. Avoid deodorant 9. Avoidance of dairy (milk, cheese, yogurt, cream).  Some studies have shown that eliminating dairy from your diet has improved symptoms in as soon as 2 weeks.  Make sure to take a daily multivitamin if you choose to do this. 10. Apply a warm compress/ washcloth to affected area several times daily.  This will help the abscess open up, drain and heal.  What should you do if none of the above is helping?  Come back and see me.  We may need to put you on antibiotics, drain your infection in the office or refer you to a dermatologist.

## 2016-12-22 NOTE — Addendum Note (Signed)
Addended by: Christen Bame D on: 12/22/2016 11:34 AM   Modules accepted: Orders

## 2016-12-22 NOTE — Progress Notes (Signed)
   Subjective: CC: armpit boil ZOX:WRUEAV Virginia Cardenas is a 31 y.o. female presenting to clinic today for same day appointment. PCP: Georges Lynch, MD Concerns today include:  1. Armpit boil Patient reports that she has had a boil under her left armpit for a couple of weeks but that over that last couple of days it seems to have gotten more inflamed and painful.  She notes that she has had this several times before but that normally they come to a head and she can pop them.  She has not been able to do that with this lesion.  She reports pain and swelling at site.  She denies fevers, chills, nausea, vomiting, bleeding/ exudate from lesion.  She has been using warm compresses and took a dose of Tramadol with no improvement.  Allergies  Allergen Reactions  . Latex Rash  . Metronidazole Rash    See photo of rash in office visit note from 06/01/16    Social Hx reviewed. MedHx, current medications and allergies reviewed.  Please see EMR. ROS: Per HPI  Objective: Office vital signs reviewed. BP 105/80   Pulse 100   Temp 97.5 F (36.4 C)   Wt 218 lb 12.8 oz (99.2 kg)   SpO2 98%   BMI 36.41 kg/m   Physical Examination:  General: Awake, alert, obese, No acute distress, mother at bedside Skin: dry; intact; 3.5 cm x 1.5 cm area of induration under the left axilla.  No palpable fluctuance.  Moderate TTP to area.  Erythema appreciated.    Assessment/ Plan: 31 y.o. female   1. Hidradenitis axillaris w/ acute axillary lesion.  No palpable fluctuance on exam.  Lesion is moderately indurated and painful to patient.  I have recommended that she start oral abx given erythema and induration.  Patient to return in 48 hours or sooner if needed for reevaluation and possible I&D if palpable fluctuance at that time. - Home care instructions reviewed with patient.  She is to continue warm compresses several times daily. - Handout to reduce recurrence given - doxycycline (VIBRA-TABS) 100 MG tablet; Take 1  tablet (100 mg total) by mouth 2 (two) times daily.  Dispense: 20 tablet; Refill: 0 - naproxen (NAPROSYN) 500 MG tablet; Take 1 tablet (500 mg total) by mouth 2 (two) times daily with a meal. As needed for pain/ inflammation  Dispense: 20 tablet; Refill: 0 - Toradol 30mg  IM administered today. - Patient to start Naprosyn tomorrow - Take meds with food - Work note provided - Return precautions reviewed - Follow up in 2 days for possible I&D, recheck.   Janora Norlander, DO PGY-3, [facility]

## 2016-12-23 ENCOUNTER — Other Ambulatory Visit: Payer: Self-pay | Admitting: Family Medicine

## 2016-12-23 DIAGNOSIS — L732 Hidradenitis suppurativa: Secondary | ICD-10-CM

## 2016-12-23 MED ORDER — NAPROXEN 500 MG PO TABS: 500.0000 mg | ORAL_TABLET | Freq: Two times a day (BID) | ORAL | 0 refills | Status: DC

## 2016-12-23 MED ORDER — DOXYCYCLINE HYCLATE 100 MG PO TABS: 100.0000 mg | ORAL_TABLET | Freq: Two times a day (BID) | ORAL | 0 refills | Status: DC

## 2016-12-23 NOTE — Telephone Encounter (Signed)
Pt needs doxycycline switched to [employer] [facility] and Naproxen switched to [facility] on [address]. Pt needs this meds called in ASAP, pt is going out of town @ 87. ep

## 2016-12-23 NOTE — Telephone Encounter (Signed)
Medications sent to the different pharmacies for the patient.  Derl Barrow, RN

## 2016-12-24 ENCOUNTER — Ambulatory Visit: Payer: Medicaid Other | Admitting: Family Medicine

## 2016-12-27 ENCOUNTER — Ambulatory Visit: Payer: Medicaid Other | Admitting: Family Medicine

## 2017-03-15 ENCOUNTER — Encounter: Payer: Self-pay | Admitting: Family Medicine

## 2017-03-15 ENCOUNTER — Ambulatory Visit (INDEPENDENT_AMBULATORY_CARE_PROVIDER_SITE_OTHER): Payer: Self-pay | Admitting: Family Medicine

## 2017-03-15 VITALS — BP 92/60 | HR 85 | Temp 98.2°F | Ht 65.0 in | Wt 220.2 lb

## 2017-03-15 DIAGNOSIS — B86 Scabies: Secondary | ICD-10-CM

## 2017-03-15 MED ORDER — PERMETHRIN 5 % EX CREA: 1.0000 "application " | TOPICAL_CREAM | Freq: Once | CUTANEOUS | 0 refills | Status: DC

## 2017-03-15 MED ORDER — PERMETHRIN 5 % EX CREA: 1.0000 "application " | TOPICAL_CREAM | Freq: Once | CUTANEOUS | 0 refills | Status: AC

## 2017-03-15 NOTE — Assessment & Plan Note (Signed)
Rash is consistent with scabies We'll treat the patient and family with permethrin Given instructions on how to use permethrin Scabies handout given to the patient Stressed the importance of cleaning her linens and clothes and bagging up things that are not washable for at least 3 days Return precautions discussed
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
## 2017-03-15 NOTE — Patient Instructions (Signed)
Scabies, Adult Scabies is a skin condition that happens when very small insects get under the skin (infestation). This causes a rash and severe itchiness. Scabies can spread from person to person (is contagious). If you get scabies, it is common for others in your household to get scabies too. With proper treatment, symptoms usually go away in 2-4 weeks. Scabies usually does not cause lasting problems. What are the causes? This condition is caused by mites (Sarcoptes scabiei, or human itch mites) that can only be seen with a microscope. The mites get into the top layer of skin and lay eggs. Scabies can spread from person to person through:  Close contact with a person who has scabies.  Contact with infested items, such as towels, bedding, or clothing. What increases the risk? This condition is more likely to develop in:  People who live in nursing homes and other extended-care facilities.  People who have sexual contact with a partner who has scabies.  Young children who attend child care facilities.  People who care for others who are at increased risk for scabies. What are the signs or symptoms? Symptoms of this condition may include:  Severe itchiness. This is often worse at night.  A rash that includes tiny red bumps or blisters. The rash commonly occurs on the wrist, elbow, armpit, fingers, waist, groin, or buttocks. Bumps may form a line (burrow) in some areas.  Skin irritation. This can include scaly patches or sores. How is this diagnosed? This condition is diagnosed with a physical exam. Your health care provider will look closely at your skin. In some cases, your health care provider may take a sample of your affected skin (skin scraping) and have it examined under a microscope. How is this treated? This condition may be treated with:  Medicated cream or lotion that kills the mites. This is spread on the entire body and left on for several hours. Usually, one treatment with  medicated cream or lotion is enough to kill all of the mites. In severe cases, the treatment may be repeated.  Medicated cream that relieves itching.  Medicines that help to relieve itching.  Medicines that kill the mites. This treatment is rarely used. Follow these instructions at home:   Medicines   Take or apply over-the-counter and prescription medicines as told by your health care provider.  Apply medicated cream or lotion as told by your health care provider.  Do not wash off the medicated cream or lotion until the necessary amount of time has passed. Skin Care   Avoid scratching your affected skin.  Keep your fingernails closely trimmed to reduce injury from scratching.  Take cool baths or apply cool washcloths to help reduce itching. General instructions   Clean all items that you recently had contact with, including bedding, clothing, and furniture. Do this on the same day that your treatment starts.  Use hot water when you wash items.  Place unwashable items into closed, airtight plastic bags for at least 3 days. The mites cannot live for more than 3 days away from human skin.  Vacuum furniture and mattresses that you use.  Make sure that other people who may have been infested are examined by a health care provider. These include members of your household and anyone who may have had contact with infested items.  Keep all follow-up visits as told by your health care provider. This is important. Contact a health care provider if:  You have itching that does not go away   after 4 weeks of treatment.  You continue to develop new bumps or burrows.  You have redness, swelling, or pain in your rash area after treatment.  You have fluid, blood, or pus coming from your rash. This information is not intended to replace advice given to you by your health care provider. Make sure you discuss any questions you have with your health care provider. Document Released:  06/25/2015 Document Revised: 03/11/2016 Document Reviewed: 05/06/2015 Elsevier Interactive Patient Education  2017 Elsevier Inc.  

## 2017-09-21 ENCOUNTER — Other Ambulatory Visit: Payer: Self-pay

## 2017-09-21 ENCOUNTER — Emergency Department (HOSPITAL_BASED_OUTPATIENT_CLINIC_OR_DEPARTMENT_OTHER)
Admission: EM | Admit: 2017-09-21 | Discharge: 2017-09-21 | Disposition: A | Discharge status: Home / Self Care | Payer: Self-pay | Attending: Emergency Medicine | Admitting: Emergency Medicine

## 2017-09-21 ENCOUNTER — Encounter (HOSPITAL_BASED_OUTPATIENT_CLINIC_OR_DEPARTMENT_OTHER): Payer: Self-pay

## 2017-09-21 DIAGNOSIS — F17210 Nicotine dependence, cigarettes, uncomplicated: Secondary | ICD-10-CM | POA: Insufficient documentation

## 2017-09-21 DIAGNOSIS — Z79899 Other long term (current) drug therapy: Secondary | ICD-10-CM | POA: Insufficient documentation

## 2017-09-21 DIAGNOSIS — K529 Noninfective gastroenteritis and colitis, unspecified: Secondary | ICD-10-CM | POA: Insufficient documentation

## 2017-09-21 LAB — CBC WITH DIFFERENTIAL/PLATELET
BASOS ABS: 0 10*3/uL (ref 0.0–0.1)
Basophils Relative: 1 %
Eosinophils Absolute: 0.2 10*3/uL (ref 0.0–0.7)
Eosinophils Relative: 3 %
HEMATOCRIT: 35.9 % — AB (ref 36.0–46.0)
Hemoglobin: 12.7 g/dL (ref 12.0–15.0)
LYMPHS PCT: 57 %
Lymphs Abs: 3.4 10*3/uL (ref 0.7–4.0)
MCH: 30.2 pg (ref 26.0–34.0)
MCHC: 35.4 g/dL (ref 30.0–36.0)
MCV: 85.3 fL (ref 78.0–100.0)
MONO ABS: 0.4 10*3/uL (ref 0.1–1.0)
MONOS PCT: 7 %
NEUTROS ABS: 1.9 10*3/uL (ref 1.7–7.7)
Neutrophils Relative %: 32 %
Platelets: 307 10*3/uL (ref 150–400)
RBC: 4.21 MIL/uL (ref 3.87–5.11)
RDW: 13.1 % (ref 11.5–15.5)
WBC: 5.9 10*3/uL (ref 4.0–10.5)

## 2017-09-21 LAB — COMPREHENSIVE METABOLIC PANEL
ALT: 23 U/L (ref 14–54)
ANION GAP: 6 (ref 5–15)
AST: 27 U/L (ref 15–41)
Albumin: 4 g/dL (ref 3.5–5.0)
Alkaline Phosphatase: 64 U/L (ref 38–126)
BUN: 8 mg/dL (ref 6–20)
CHLORIDE: 105 mmol/L (ref 101–111)
CO2: 28 mmol/L (ref 22–32)
Calcium: 9 mg/dL (ref 8.9–10.3)
Creatinine, Ser: 0.6 mg/dL (ref 0.44–1.00)
GFR calc Af Amer: >60 mL/min (ref >60)
GFR calc non Af Amer: >60 mL/min (ref >60)
GLUCOSE: 98 mg/dL (ref 65–99)
POTASSIUM: 3.6 mmol/L (ref 3.5–5.1)
Sodium: 139 mmol/L (ref 135–145)
Total Bilirubin: 0.6 mg/dL (ref 0.3–1.2)
Total Protein: 7.9 g/dL (ref 6.5–8.1)

## 2017-09-21 LAB — HCG, SERUM, QUALITATIVE: Preg, Serum: NEGATIVE

## 2017-09-21 MED ORDER — ONDANSETRON HCL 4 MG/2ML IJ SOLN
4.0000 mg | Freq: Once | INTRAMUSCULAR | Status: AC
  Administered 2017-09-21: 4 mg via INTRAVENOUS
  Filled 2017-09-21: qty 2

## 2017-09-21 MED ORDER — ONDANSETRON 8 MG PO TBDP: ORAL_TABLET | ORAL | 0 refills | Status: DC

## 2017-09-21 MED ORDER — SODIUM CHLORIDE 0.9 % IV BOLUS (SEPSIS)
1000.0000 mL | Freq: Once | INTRAVENOUS | Status: AC
  Administered 2017-09-21: 1000 mL via INTRAVENOUS

## 2017-09-21 MED ORDER — KETOROLAC TROMETHAMINE 30 MG/ML IJ SOLN
30.0000 mg | Freq: Once | INTRAMUSCULAR | Status: AC
  Administered 2017-09-21: 30 mg via INTRAVENOUS
  Filled 2017-09-21: qty 1

## 2017-09-21 NOTE — ED Triage Notes (Signed)
Pt c/o diarrhea for the last few nights, was constipated per spouse a few days ago, and took magnesium citrate, now is having diarrhea and cramping.  Had a sip of Alka Seltzer, but has not actually tried anything for diarrhea.  No fevers, no vomiting, two kids are here with viral illnesses as well.

## 2017-09-21 NOTE — Discharge Instructions (Signed)
Zofran as prescribed as needed for nausea.  Clear liquid diet as tolerated for the next 12 hours, then slowly advance to normal.  Return to the emergency department for severe abdominal pain, bloody stools, high fevers, or other new and concerning symptoms.
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
## 2017-09-21 NOTE — ED Notes (Signed)
Pt verbalizes understanding of d/c instructions and denies any further needs at this time. 

## 2017-11-22 ENCOUNTER — Encounter (HOSPITAL_COMMUNITY): Payer: Self-pay | Admitting: Emergency Medicine

## 2017-11-22 ENCOUNTER — Other Ambulatory Visit: Payer: Self-pay

## 2017-11-22 ENCOUNTER — Ambulatory Visit (HOSPITAL_COMMUNITY)
Admission: EM | Admit: 2017-11-22 | Discharge: 2017-11-22 | Disposition: A | Discharge status: Home / Self Care | Payer: Self-pay | Attending: Emergency Medicine | Admitting: Emergency Medicine

## 2017-11-22 DIAGNOSIS — Z202 Contact with and (suspected) exposure to infections with a predominantly sexual mode of transmission: Secondary | ICD-10-CM | POA: Insufficient documentation

## 2017-11-22 DIAGNOSIS — Z113 Encounter for screening for infections with a predominantly sexual mode of transmission: Secondary | ICD-10-CM

## 2017-11-22 DIAGNOSIS — Z881 Allergy status to other antibiotic agents status: Secondary | ICD-10-CM | POA: Insufficient documentation

## 2017-11-22 DIAGNOSIS — B86 Scabies: Secondary | ICD-10-CM | POA: Insufficient documentation

## 2017-11-22 DIAGNOSIS — Z711 Person with feared health complaint in whom no diagnosis is made: Secondary | ICD-10-CM

## 2017-11-22 DIAGNOSIS — N898 Other specified noninflammatory disorders of vagina: Secondary | ICD-10-CM

## 2017-11-22 DIAGNOSIS — F17210 Nicotine dependence, cigarettes, uncomplicated: Secondary | ICD-10-CM | POA: Insufficient documentation

## 2017-11-22 DIAGNOSIS — M545 Low back pain: Secondary | ICD-10-CM | POA: Insufficient documentation

## 2017-11-22 DIAGNOSIS — Z3202 Encounter for pregnancy test, result negative: Secondary | ICD-10-CM

## 2017-11-22 LAB — POCT URINALYSIS DIP (DEVICE)
Bilirubin Urine: NEGATIVE
Glucose, UA: NEGATIVE mg/dL
HGB URINE DIPSTICK: NEGATIVE
Ketones, ur: NEGATIVE mg/dL
LEUKOCYTES UA: NEGATIVE
Nitrite: NEGATIVE
PH: 7 (ref 5.0–8.0)
PROTEIN: NEGATIVE mg/dL
SPECIFIC GRAVITY, URINE: 1.01 (ref 1.005–1.030)
UROBILINOGEN UA: 0.2 mg/dL (ref 0.0–1.0)

## 2017-11-22 LAB — POCT PREGNANCY, URINE: Preg Test, Ur: NEGATIVE

## 2017-11-22 MED ORDER — STERILE WATER FOR INJECTION IJ SOLN
INTRAMUSCULAR | Status: AC
  Filled 2017-11-22: qty 10

## 2017-11-22 MED ORDER — CEFTRIAXONE SODIUM 250 MG IJ SOLR
INTRAMUSCULAR | Status: AC
  Filled 2017-11-22: qty 250

## 2017-11-22 MED ORDER — AZITHROMYCIN 250 MG PO TABS
ORAL_TABLET | ORAL | Status: AC
  Filled 2017-11-22: qty 4

## 2017-11-22 MED ORDER — CLINDAMYCIN HCL 300 MG PO CAPS: 300.0000 mg | ORAL_CAPSULE | Freq: Two times a day (BID) | ORAL | 0 refills | Status: AC

## 2017-11-22 MED ORDER — CEFTRIAXONE SODIUM 250 MG IJ SOLR
250.0000 mg | Freq: Once | INTRAMUSCULAR | Status: AC
  Administered 2017-11-22: 250 mg via INTRAMUSCULAR

## 2017-11-22 MED ORDER — AZITHROMYCIN 250 MG PO TABS
1000.0000 mg | ORAL_TABLET | Freq: Once | ORAL | Status: AC
  Administered 2017-11-22: 1000 mg via ORAL

## 2017-11-22 NOTE — Discharge Instructions (Signed)
Today we have treated you for gonorrhea and chlamydia. Will start treatment for bacterial vaginosis, complete clindamycin. Please withhold from intercourse for the next week. Please use condoms to prevent STD's.   Will notify you of any positive findings and if any changes to treatment are needed, you may also check results on MyChart
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
## 2017-11-22 NOTE — ED Triage Notes (Signed)
Pt c/o vaginal odor x1 week with some discharge.

## 2017-11-23 LAB — URINE CYTOLOGY ANCILLARY ONLY
Chlamydia: NEGATIVE
Neisseria Gonorrhea: NEGATIVE
Trichomonas: NEGATIVE

## 2017-11-25 LAB — URINE CYTOLOGY ANCILLARY ONLY
Bacterial vaginitis: POSITIVE — AB
CANDIDA VAGINITIS: NEGATIVE

## 2018-02-17 ENCOUNTER — Telehealth: Payer: Self-pay | Admitting: Internal Medicine

## 2018-02-17 NOTE — Telephone Encounter (Signed)
Pt wants to know if Dr. Reesa Chew can do a physical for her Virginia Cardenas is her doctor) bc her mom Dania Marsan) is coming in that day and her mom is her ride here.   Please call her on her cell number to let her know and I will save a slot on 03/10/16.

## 2018-03-09 ENCOUNTER — Other Ambulatory Visit (HOSPITAL_COMMUNITY)
Admission: RE | Admit: 2018-03-09 | Discharge: 2018-03-09 | Disposition: A | Discharge status: Home / Self Care | Payer: Self-pay | Source: Ambulatory Visit | Attending: Family Medicine | Admitting: Family Medicine

## 2018-03-09 ENCOUNTER — Ambulatory Visit (INDEPENDENT_AMBULATORY_CARE_PROVIDER_SITE_OTHER): Payer: Self-pay | Admitting: Internal Medicine

## 2018-03-09 VITALS — BP 102/60 | HR 80 | Temp 98.5°F | Wt 210.6 lb

## 2018-03-09 DIAGNOSIS — E669 Obesity, unspecified: Secondary | ICD-10-CM

## 2018-03-09 DIAGNOSIS — Z113 Encounter for screening for infections with a predominantly sexual mode of transmission: Secondary | ICD-10-CM

## 2018-03-09 DIAGNOSIS — L0293 Carbuncle, unspecified: Secondary | ICD-10-CM

## 2018-03-09 DIAGNOSIS — N898 Other specified noninflammatory disorders of vagina: Secondary | ICD-10-CM | POA: Insufficient documentation

## 2018-03-09 DIAGNOSIS — Z720 Tobacco use: Secondary | ICD-10-CM

## 2018-03-09 DIAGNOSIS — Z124 Encounter for screening for malignant neoplasm of cervix: Secondary | ICD-10-CM | POA: Insufficient documentation

## 2018-03-09 DIAGNOSIS — Z111 Encounter for screening for respiratory tuberculosis: Secondary | ICD-10-CM

## 2018-03-09 LAB — POCT WET PREP (WET MOUNT)
CLUE CELLS WET PREP WHIFF POC: NEGATIVE
TRICHOMONAS WET PREP HPF POC: ABSENT

## 2018-03-09 MED ORDER — NICOTINE 7 MG/24HR TD PT24: 7.0000 mg | MEDICATED_PATCH | Freq: Every day | TRANSDERMAL | 0 refills | Status: DC

## 2018-03-09 MED ORDER — NICOTINE 14 MG/24HR TD PT24: 14.0000 mg | MEDICATED_PATCH | Freq: Every day | TRANSDERMAL | 0 refills | Status: DC

## 2018-03-09 MED ORDER — NICOTINE 21 MG/24HR TD PT24: 21.0000 mg | MEDICATED_PATCH | Freq: Every day | TRANSDERMAL | 0 refills | Status: DC

## 2018-03-09 NOTE — Patient Instructions (Signed)
Ms. Snodgrass,  Please let us know where to fax the TB test results.  I will call with lab results.  Pick a quit date and start the 21 mg daily nicotine patches. After a month, you can switch to 14 mg. Then the next month try 7 mg. If you are having difficulties, please make an appointment with Dr. Valentina Lucks for help quitting smoking.  Best, Dr. Ola Spurr

## 2018-03-10 ENCOUNTER — Ambulatory Visit: Payer: Self-pay

## 2018-03-10 LAB — CERVICOVAGINAL ANCILLARY ONLY
Chlamydia: NEGATIVE
NEISSERIA GONORRHEA: NEGATIVE

## 2018-03-12 ENCOUNTER — Encounter: Payer: Self-pay | Admitting: Internal Medicine

## 2018-03-12 DIAGNOSIS — L0293 Carbuncle, unspecified: Secondary | ICD-10-CM | POA: Insufficient documentation

## 2018-03-12 DIAGNOSIS — E66812 Obesity, class 2: Secondary | ICD-10-CM | POA: Insufficient documentation

## 2018-03-12 DIAGNOSIS — Z111 Encounter for screening for respiratory tuberculosis: Secondary | ICD-10-CM | POA: Insufficient documentation

## 2018-03-12 DIAGNOSIS — E669 Obesity, unspecified: Secondary | ICD-10-CM | POA: Insufficient documentation

## 2018-03-12 DIAGNOSIS — Z124 Encounter for screening for malignant neoplasm of cervix: Secondary | ICD-10-CM | POA: Insufficient documentation

## 2018-03-12 NOTE — Progress Notes (Signed)
[facility] Progress Note  Subjective:  Virginia Cardenas is a 32 y.o. female who presents for check-up.  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 Latex Rash  . Metronidazole Rash    See photo of rash in office visit note from 06/01/16    Social History   Tobacco Use  . Smoking status: Current Every Day Smoker    Packs/day: 0.00    Types: Cigarettes  . Smokeless tobacco: Never Used  Substance Use Topics  . Alcohol use: Yes    Objective: Blood pressure 102/60, pulse 80, temperature 98.5 F (36.9 C), temperature source Oral, weight 210 lb 9.6 oz (95.5 kg), SpO2 100 %. Body mass index is 35.05 kg/m. Constitutional: Obese female in NAD HENT: PERRL, normal posterior oropharynx Cardiovascular: RRR, S1, S2, no m/r/g.  Pulmonary/Chest: Effort normal and breath sounds normal.  Abdominal: Soft. +BS, NT Musculoskeletal: No LE edema GU: Chaperone present. No lesions or vaginal discharge noted on speculum exam. No cervical motion tenderness or adnexal tenderness with bimanual exam.  Neurological: AOx3, no focal deficits. Skin: Skin is warm and dry. Pock-like scarring across arms and abdomen.  Psychiatric: Normal mood and affect.  Vitals reviewed  Assessment/Plan: Cervical cancer screening - performed pap smear  Tuberculosis screening - ordered quantiferon gold testing, as patient needs TB testing results next week and office closed Monday for PPD check. She will contact office with fax number to which to send results  Screen for STD  (sexually transmitted disease) - ordered gc/chlamydia, wet prep, HIV, RPR  Obesity - ordered lipid testing and cmp  Tobacco abuse - discussed different cessation tools. Provided prescriptions for patches with coupon to try at pharmacy.   Recurrent boils - Recommended tobacco cessation to decrease inflammation.  - Suggested trying hibiclens with showering a couple times a week - May indicate hormonal imbalance --- plan to assess more completely when patient returns to discuss menstrual cycle irregularity  Patient mentions that she has not had regular periods in over 8 years. Recommended she make appointment to discuss this fully at her earliest convenience.   Olene Floss, MD [place], PGY-3

## 2018-03-12 NOTE — Assessment & Plan Note (Signed)
-   Recommended tobacco cessation to decrease inflammation.  - Suggested trying hibiclens with showering a couple times a week - May indicate hormonal imbalance --- plan to assess more completely when patient returns to discuss menstrual cycle irregularity

## 2018-03-12 NOTE — Assessment & Plan Note (Signed)
-   ordered lipid testing and cmp

## 2018-03-12 NOTE — Assessment & Plan Note (Signed)
-   discussed different cessation tools. Provided prescriptions for patches with coupon to try at pharmacy.

## 2018-03-12 NOTE — Assessment & Plan Note (Signed)
-   performed pap smear

## 2018-03-12 NOTE — Assessment & Plan Note (Signed)
-   ordered quantiferon gold testing, as patient needs TB testing results next week and office closed Monday for PPD check. She will contact office with fax number to which to send results

## 2018-03-12 NOTE — Assessment & Plan Note (Signed)
-   ordered gc/chlamydia, wet prep, HIV, RPR

## 2018-03-14 ENCOUNTER — Encounter: Payer: Self-pay | Admitting: *Deleted

## 2018-03-14 ENCOUNTER — Telehealth: Payer: Self-pay | Admitting: *Deleted

## 2018-03-14 ENCOUNTER — Other Ambulatory Visit: Payer: Self-pay | Admitting: Internal Medicine

## 2018-03-14 ENCOUNTER — Telehealth: Payer: Self-pay | Admitting: Internal Medicine

## 2018-03-14 LAB — COMPREHENSIVE METABOLIC PANEL
ALT: 14 IU/L (ref 0–32)
AST: 25 IU/L (ref 0–40)
Albumin/Globulin Ratio: 1.5 (ref 1.2–2.2)
Albumin: 4.5 g/dL (ref 3.5–5.5)
Alkaline Phosphatase: 68 IU/L (ref 39–117)
BUN/Creatinine Ratio: 9 (ref 9–23)
BUN: 7 mg/dL (ref 6–20)
Bilirubin Total: 0.3 mg/dL (ref 0.0–1.2)
CALCIUM: 9.3 mg/dL (ref 8.7–10.2)
CHLORIDE: 105 mmol/L (ref 96–106)
CO2: 22 mmol/L (ref 20–29)
Creatinine, Ser: 0.74 mg/dL (ref 0.57–1.00)
GFR, EST AFRICAN AMERICAN: 127 mL/min/{1.73_m2} (ref >59)
GFR, EST NON AFRICAN AMERICAN: 110 mL/min/{1.73_m2} (ref >59)
GLUCOSE: 83 mg/dL (ref 65–99)
Globulin, Total: 3.1 g/dL (ref 1.5–4.5)
Potassium: 4.2 mmol/L (ref 3.5–5.2)
Sodium: 142 mmol/L (ref 134–144)
TOTAL PROTEIN: 7.6 g/dL (ref 6.0–8.5)

## 2018-03-14 LAB — QUANTIFERON-TB GOLD PLUS
QUANTIFERON NIL VALUE: 0.26 [IU]/mL
QUANTIFERON-TB GOLD PLUS: NEGATIVE
QuantiFERON TB1 Ag Value: 0.28 IU/mL
QuantiFERON TB2 Ag Value: 0.48 IU/mL

## 2018-03-14 LAB — LIPID PANEL
CHOL/HDL RATIO: 6.5 ratio — AB (ref 0.0–4.4)
Cholesterol, Total: 201 mg/dL — ABNORMAL HIGH (ref 100–199)
HDL: 31 mg/dL — ABNORMAL LOW (ref >39)
LDL CALC: 130 mg/dL — AB (ref 0–99)
Triglycerides: 199 mg/dL — ABNORMAL HIGH (ref 0–149)
VLDL Cholesterol Cal: 40 mg/dL (ref 5–40)

## 2018-03-14 LAB — CYTOLOGY - PAP: Diagnosis: NEGATIVE

## 2018-03-14 LAB — HIV ANTIBODY (ROUTINE TESTING W REFLEX): HIV SCREEN 4TH GENERATION: NONREACTIVE

## 2018-03-14 LAB — RPR: RPR Ser Ql: NONREACTIVE

## 2018-03-14 MED ORDER — CHLORHEXIDINE GLUCONATE 4 % EX LIQD: CUTANEOUS | 3 refills | Status: DC

## 2018-03-14 NOTE — Telephone Encounter (Signed)
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 The fax number is [phone] and put at ATTN: Tory Emerald. Please advise

## 2018-03-14 NOTE — Telephone Encounter (Signed)
Pt informed of normal pap result. Deseree Kennon Holter, CMA

## 2018-03-14 NOTE — Telephone Encounter (Signed)
Placed in faxed pile to be faxed. Deseree Blount, CMA  

## 2018-03-14 NOTE — Telephone Encounter (Signed)
-----   Message from [facility], MD sent at 03/14/2018  1:31 PM EDT ----- Please let patient know her pap smear was normal. Thank you!

## 2018-03-17 ENCOUNTER — Ambulatory Visit: Payer: Self-pay | Admitting: Internal Medicine

## 2018-10-12 ENCOUNTER — Ambulatory Visit (INDEPENDENT_AMBULATORY_CARE_PROVIDER_SITE_OTHER): Payer: Self-pay | Admitting: Family Medicine

## 2018-10-12 ENCOUNTER — Other Ambulatory Visit (HOSPITAL_COMMUNITY)
Admission: RE | Admit: 2018-10-12 | Discharge: 2018-10-12 | Disposition: A | Discharge status: Home / Self Care | Payer: Self-pay | Source: Ambulatory Visit | Attending: Family Medicine | Admitting: Family Medicine

## 2018-10-12 ENCOUNTER — Encounter: Payer: Self-pay | Admitting: Family Medicine

## 2018-10-12 ENCOUNTER — Other Ambulatory Visit: Payer: Self-pay

## 2018-10-12 VITALS — Wt 196.0 lb

## 2018-10-12 DIAGNOSIS — A64 Unspecified sexually transmitted disease: Secondary | ICD-10-CM | POA: Insufficient documentation

## 2018-10-12 DIAGNOSIS — N926 Irregular menstruation, unspecified: Secondary | ICD-10-CM | POA: Insufficient documentation

## 2018-10-12 LAB — POCT URINE PREGNANCY: Preg Test, Ur: NEGATIVE

## 2018-10-12 NOTE — Assessment & Plan Note (Signed)
Patient reports LMP 10/2017, has not noted any spotting or bleeding over the last 12 months.  No abdominal or pelvic cramping.  She positive she is not pregnant as she has taken several home pregnancy tests.  No systemic symptoms or red flags on exam.  She is concerned she may be undergoing menopause early, will work-up for premature ovarian failure. -Urine pregnancy test negative in office today -check TSH to r/o thyroid etiology as underlying cause -Pelvic US complete ordered, scheduled at [place], [facility] testing for further evaluation

## 2018-10-12 NOTE — Progress Notes (Signed)
   Subjective:   Patient ID: Virginia Cardenas    DOB: 20-May-1986, 32 y.o. female   MRN: [id number]  CC: routine physical, irregular periods  HPI: [name] is a 32 y.o. female who presents to clinic today for the following issue.  Irregular menstrual cycle LMP January 2019, was normal length and flow.  Patient reports she is positive she is not pregnant and has taken multiple home pregnancy tests.  She was having irregular periods about every 3 months previous to this last menstrual period.  Denies spotting in between.  No abdominal pain or cramping.  She notes sometimes she has a small lump in her left groin area which comes and goes and is nontender.  She is not currently sexually active.  Does not use any method of birth control.  No fevers, chills, nausea, vomiting.  History of a C-section in 2008.  She is concerned she is going through menopause early.  ROS: See HPI for pertinent ROS.  Social: Patient is a current every day smoker Medications reviewed. Objective:   Wt 196 lb (88.9 kg)   LMP 10/12/2017 (Approximate)   BMI 32.62 kg/m  Vitals and nursing note reviewed.  General: 32 year old female, NAD HEENT: NCAT, EOMI, PERRL, no conjunctival pallor Neck: supple CV: RRR no MRG  Lungs: CTAB, normal effort  Abdomen: soft, 1.5 cm non-tender indurated area over left groin area, no surrounding erythema or warmth, no palpable fluctuance, +bs Skin: warm, dry, no rash Extremities: warm and well perfused  Assessment & Plan:   Irregular menstrual cycle Patient reports LMP 10/2017, has not noted any spotting or bleeding over the last 12 months.  No abdominal or pelvic cramping.  She positive she is not pregnant as she has taken several home pregnancy tests.  No systemic symptoms or red flags on exam.  She is concerned she may be undergoing menopause early, will work-up for premature ovarian failure. -Urine pregnancy test negative in office today -check TSH to r/o thyroid etiology as  underlying cause -Pelvic US complete ordered, scheduled at [place], [facility] testing for further evaluation  Health Maintenance:  Tetanus shot when medicaid available  Orders Placed This Encounter  Procedures  . US PELVIC COMPLETE WITH TRANSVAGINAL    Standing Status:   Future    Standing Expiration Date:   12/14/2019    Order Specific Question:   Reason for Exam (SYMPTOM  OR DIAGNOSIS REQUIRED)    Answer:   amenorrhea    Order Specific Question:   Preferred imaging location?    Answer:   [facility]  . TSH  . POCT urine pregnancy   Lovenia Kim, MD [place]

## 2018-10-12 NOTE — Patient Instructions (Addendum)
It was nice seeing you again today.  You were seen in clinic for a routine physical and irregular periods.  We checked a urine pregnancy test today which was negative.  I have ordered some blood work to check your thyroid and also ordered a pelvic ultrasound at [place] were given the appointment date and time for this.   I will follow-up with you in 2 to 4 weeks to discuss the results of this and further work-up that may be required.  Please call clinic if you have any questions.  Lovenia Kim MD

## 2018-10-13 LAB — URINE CYTOLOGY ANCILLARY ONLY
Chlamydia: NEGATIVE
Neisseria Gonorrhea: NEGATIVE
TRICH (WINDOWPATH): NEGATIVE

## 2018-10-13 LAB — TSH: TSH: 1.68 u[IU]/mL (ref 0.450–4.500)

## 2018-10-23 ENCOUNTER — Ambulatory Visit (HOSPITAL_COMMUNITY): Payer: Self-pay | Attending: Family Medicine

## 2018-11-15 ENCOUNTER — Ambulatory Visit (HOSPITAL_COMMUNITY): Admission: RE | Admit: 2018-11-15 | Payer: Self-pay | Source: Ambulatory Visit

## 2019-07-31 ENCOUNTER — Other Ambulatory Visit: Payer: Self-pay

## 2019-07-31 DIAGNOSIS — Z20822 Contact with and (suspected) exposure to covid-19: Secondary | ICD-10-CM

## 2019-08-02 LAB — NOVEL CORONAVIRUS, NAA: SARS-CoV-2, NAA: NOT DETECTED

## 2019-09-26 ENCOUNTER — Encounter: Payer: Self-pay | Admitting: Family Medicine

## 2019-09-28 ENCOUNTER — Ambulatory Visit (INDEPENDENT_AMBULATORY_CARE_PROVIDER_SITE_OTHER): Payer: Self-pay | Admitting: Family Medicine

## 2019-09-28 ENCOUNTER — Other Ambulatory Visit: Payer: Self-pay

## 2019-09-28 ENCOUNTER — Other Ambulatory Visit (HOSPITAL_COMMUNITY)
Admission: RE | Admit: 2019-09-28 | Discharge: 2019-09-28 | Disposition: A | Discharge status: Home / Self Care | Payer: Self-pay | Source: Ambulatory Visit | Attending: Family Medicine | Admitting: Family Medicine

## 2019-09-28 ENCOUNTER — Encounter: Payer: Self-pay | Admitting: Family Medicine

## 2019-09-28 VITALS — BP 118/78 | HR 81 | Wt 212.2 lb

## 2019-09-28 DIAGNOSIS — N911 Secondary amenorrhea: Secondary | ICD-10-CM

## 2019-09-28 DIAGNOSIS — Z111 Encounter for screening for respiratory tuberculosis: Secondary | ICD-10-CM

## 2019-09-28 DIAGNOSIS — Z0000 Encounter for general adult medical examination without abnormal findings: Secondary | ICD-10-CM

## 2019-09-28 DIAGNOSIS — H539 Unspecified visual disturbance: Secondary | ICD-10-CM

## 2019-09-28 DIAGNOSIS — B9689 Other specified bacterial agents as the cause of diseases classified elsewhere: Secondary | ICD-10-CM

## 2019-09-28 DIAGNOSIS — Z113 Encounter for screening for infections with a predominantly sexual mode of transmission: Secondary | ICD-10-CM | POA: Insufficient documentation

## 2019-09-28 DIAGNOSIS — N760 Acute vaginitis: Secondary | ICD-10-CM

## 2019-09-28 LAB — POCT WET PREP (WET MOUNT)
Clue Cells Wet Prep Whiff POC: POSITIVE
Trichomonas Wet Prep HPF POC: ABSENT

## 2019-09-28 LAB — POCT URINE PREGNANCY: Preg Test, Ur: NEGATIVE

## 2019-09-28 MED ORDER — CLINDAMYCIN PHOSPHATE 2 % VA CREA: 1.0000 | TOPICAL_CREAM | Freq: Every day | VAGINAL | 0 refills | Status: AC

## 2019-09-28 NOTE — Patient Instructions (Addendum)
It was a wonderful seeing you today.  I will try to get the records from your eye doctor and then talk about vitamin a afterwards.  I will let you know the results of your swabs and labs within the next few days, hopefully your QuantiFERON gold will return by Monday so that you can bring this to your new employer.  I will have you follow-up pending your labs. make sure you are staying physically active.  I also highly encourage you to think about cutting back on your smoking, if you decide this is something you like to do please give [place] a call making try to help with assistance.

## 2019-09-28 NOTE — Progress Notes (Signed)
Subjective:  CC -- Annual Physical; With complaints of vaginal odor/STD screening, amenorrhea, need for TB screening and request for vitamin A supplementation  STD screening  vaginal odor: States she has not been sexually active in the past year, however would like to be screened due to vaginal odor.  Feels like she is noticed it on and off.  Denies any change in discharge frequency or consistency.  No particular vaginal itching or irritation.   Amenorrhea: Previously seen for this concern 09/2018 when she had not had a menstrual cycle in 1 year, had a unremarkable TSH and negative pregnancy test at that time.  A pelvic ultrasound was ordered, however she did not have it completed due to cost.  Today she presents and states it is now been 2 years that she has not had a menstrual cycle.  She is very frustrated with this and concerned, wants to know what is happening with her body.  She denies any history of excessive exercise, extreme dieting, galactorrhea, hirsutism, or significant weight changes.  Does not have a history of uterine instrumentation.  Does not have a history of poorly controlled diabetes or thyroid dysfunction.  Sexually active 1 year ago as discussed above.  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  Told the only way that she could help possibly mitigate this would be to start vitamin A supplementation and to see her PCP for this prescription.  Cardiovascular: - Dx Hypertension: no  - Dx Hyperlipidemia: no - Dx Obesity: yes, BMI 35 - Physical Activity: Yes, at least 2 times a week, however is always active with her children - Diabetes: no  Cancer: Not due for colorectal, lung, or breast cancer screenings yet due to age.  No concerning risk factors.  Cervical/Endometrial >>  - Pap Smear: no, normal in 2019, due in 2022 Skin >> no known suspicious lesions  Social: Alcohol Use: No Tobacco  Use: Smokes black and milds/cigars about 2 times a day  - Interested in [place]: Did not discuss today, will discuss on follow-up Other Drugs: No Risky Sexual Behavior: No, however will screen for STDs per patient request today, with the exception of HIV and RPR due to cost, however were negative in 02/2018 Depression: No  - PHQ2 score: 0  Other: Flu Vaccine: needs this  Pneumonia Vaccine: no  Health maintenance: Due for Tdap and flu shot  Past Medical History reviewed   Medications- reviewed   Objective: BP 118/78   Pulse 81   Wt 212 lb 3.2 oz (96.3 kg)   SpO2 98%   BMI 35.31 kg/m  Gen: NAD, alert, cooperative with HEENT: NCAT, EOMI, PERRL CV: RRR, good S1/S2, no murmur Resp: Unlabored breathing, lungs clear Abd: Soft, Non Tender, Non Distended Genital Exam: not done Vulva: not indicated and normal appearing vulva with no masses, tenderness or lesions Vagina: not indicated and normal appearing vagina with normal color and discharge, no lesions Cervix: not indicated and normal appearing cervix without discharge or lesions Ext: No edema, warm Neuro: Alert and oriented, No gross deficits   Assessment/Plan:  Annual physical exam Majority of visit spent for concerns discussed below.  Reviewed risk factors and health maintenance, will discuss necessity for flu/Tdap vaccinations and encourage smoking cessation on follow-up.  Up-to-date on cancer screenings.  Screen for STD (sexually transmitted disease) Obtained wet prep, GC/chlamydia swabs.  Wet prep returned positive for BV, due to history of rash with  metronidazole sent in vaginal clindamycin.  Patient to follow-up if symptoms not completely resolved with this therapy, may need to try alternative. -Follow-up on GC/chlamydia -Low concern for HIV and RPR and not obtained due to cost, reassuringly negative in 02/2018  Secondary amenorrhea Present for the past 2 years.  Negative U preg in the office today and reports no sexual  activity for the past year.  Normal TSH in 09/2018 after amenorrhea for 1 year.  No particular history suggestive of specific cause, will start work-up with FSH, E2, prolactin levels.  Would have considered obtaining testosterone as well, however due to significant cost of this lab will hold off on this for now.  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  Offered TB skin test which would be cheaper, however she opted for QuantiFERON gold.  We will send her this result through MyChart so that she can present this to her employer on Monday, 12/14.  Vision abnormalities Worsening vision loss already evaluated by ophthalmologist who recommended vitamin A supplementation to help slow progression.  Will obtain ophthalmology documentation to ensure diagnosis prior to starting vitamin A therapy which could be potentially toxic if taken excessively, patient signed release of records today.   Follow-up pending lab results for secondary amenorrhea.   [place]ia Clan, DO  Family Medicine PGY-2  09/30/2019 11:15 AM

## 2019-09-30 ENCOUNTER — Encounter: Payer: Self-pay | Admitting: Family Medicine

## 2019-09-30 DIAGNOSIS — Z111 Encounter for screening for respiratory tuberculosis: Secondary | ICD-10-CM | POA: Insufficient documentation

## 2019-09-30 DIAGNOSIS — N911 Secondary amenorrhea: Secondary | ICD-10-CM | POA: Insufficient documentation

## 2019-09-30 DIAGNOSIS — Z0000 Encounter for general adult medical examination without abnormal findings: Secondary | ICD-10-CM | POA: Insufficient documentation

## 2019-09-30 DIAGNOSIS — H539 Unspecified visual disturbance: Secondary | ICD-10-CM | POA: Insufficient documentation

## 2019-09-30 NOTE — Assessment & Plan Note (Signed)
Worsening vision loss already evaluated by ophthalmologist who recommended vitamin A supplementation to help slow progression.  Will obtain ophthalmology documentation to ensure diagnosis prior to starting vitamin A therapy which could be potentially toxic if taken excessively, patient signed release of records today.

## 2019-09-30 NOTE — Assessment & Plan Note (Signed)
Present for the past 2 years.  Negative U preg in the office today and reports no sexual activity for the past year.  Normal TSH in 09/2018 after amenorrhea for 1 year.  No particular history suggestive of specific cause, will start work-up with FSH, E2, prolactin levels.  Would have considered obtaining testosterone as well, however due to significant cost of this lab will hold off on this for now.

## 2019-09-30 NOTE — Assessment & Plan Note (Signed)
Majority of visit spent for concerns discussed below.  Reviewed risk factors and health maintenance, will discuss necessity for flu/Tdap vaccinations and encourage smoking cessation on follow-up.  Up-to-date on cancer screenings.

## 2019-09-30 NOTE — Assessment & Plan Note (Signed)
Obtained wet prep, GC/chlamydia swabs.  Wet prep returned positive for BV, due to history of rash with metronidazole sent in vaginal clindamycin.  Patient to follow-up if symptoms not completely resolved with this therapy, may need to try alternative. -Follow-up on GC/chlamydia -Low concern for HIV and RPR and not obtained due to cost, reassuringly negative in 02/2018

## 2019-09-30 NOTE — Assessment & Plan Note (Signed)
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  Offered TB skin test which would be cheaper, however she opted for QuantiFERON gold.  We will send her this result through MyChart so that she can present this to her employer on Monday, 12/14.

## 2019-10-01 LAB — CERVICOVAGINAL ANCILLARY ONLY
Chlamydia: NEGATIVE
Comment: NEGATIVE
Comment: NORMAL
Neisseria Gonorrhea: NEGATIVE

## 2019-10-02 ENCOUNTER — Other Ambulatory Visit: Payer: Self-pay | Admitting: Family Medicine

## 2019-10-02 DIAGNOSIS — E221 Hyperprolactinemia: Secondary | ICD-10-CM

## 2019-10-02 DIAGNOSIS — R7612 Nonspecific reaction to cell mediated immunity measurement of gamma interferon antigen response without active tuberculosis: Secondary | ICD-10-CM

## 2019-10-02 LAB — QUANTIFERON-TB GOLD PLUS
QuantiFERON Mitogen Value: >10 IU/mL
QuantiFERON Nil Value: 0.68 IU/mL
QuantiFERON TB1 Ag Value: 1.89 IU/mL
QuantiFERON TB2 Ag Value: 1.65 IU/mL
QuantiFERON-TB Gold Plus: POSITIVE — AB

## 2019-10-02 LAB — ESTRADIOL: Estradiol: 27.6 pg/mL

## 2019-10-02 LAB — PROLACTIN: Prolactin: 30.1 ng/mL — ABNORMAL HIGH (ref 4.8–23.3)

## 2019-10-02 LAB — FOLLICLE STIMULATING HORMONE: FSH: 7.2 m[IU]/mL

## 2019-10-08 ENCOUNTER — Other Ambulatory Visit: Payer: Self-pay

## 2019-10-10 ENCOUNTER — Other Ambulatory Visit: Payer: Self-pay

## 2019-10-10 DIAGNOSIS — R7612 Nonspecific reaction to cell mediated immunity measurement of gamma interferon antigen response without active tuberculosis: Secondary | ICD-10-CM

## 2019-10-10 DIAGNOSIS — E221 Hyperprolactinemia: Secondary | ICD-10-CM

## 2019-10-14 LAB — PROLACTIN: Prolactin: 35.4 ng/mL — ABNORMAL HIGH (ref 4.8–23.3)

## 2019-10-14 LAB — QUANTIFERON-TB GOLD PLUS
QuantiFERON Mitogen Value: 9.42 IU/mL
QuantiFERON Nil Value: 0.17 IU/mL
QuantiFERON TB1 Ag Value: 1.24 IU/mL
QuantiFERON TB2 Ag Value: 1.24 IU/mL
QuantiFERON-TB Gold Plus: POSITIVE — AB

## 2019-10-16 ENCOUNTER — Other Ambulatory Visit: Payer: Self-pay | Admitting: Family Medicine

## 2019-10-16 DIAGNOSIS — R7612 Nonspecific reaction to cell mediated immunity measurement of gamma interferon antigen response without active tuberculosis: Secondary | ICD-10-CM

## 2019-10-16 NOTE — Progress Notes (Signed)
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  She has been asymptomatic, denies any recent fever, cough, shortness of breath, night sweats, weight loss/changes.  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  Had a negative quant gold in 03/2018.  Reached out to Dr. Tommy Medal with infectious disease.  Recommended proceeding with chest x-ray and treating for latent TB if normal.  Chest x-ray order placed.  We will additionally recheck HIV, reassuringly negative in 03/2018.  Discussed following up at the [facility] for subsequent treatment.   Patriciaann Clan, DO

## 2019-10-22 ENCOUNTER — Other Ambulatory Visit: Payer: Self-pay

## 2019-11-27 ENCOUNTER — Telehealth: Payer: Self-pay | Admitting: *Deleted

## 2019-11-27 NOTE — Telephone Encounter (Signed)
Tried to contact pt to go over screening questions prior to visit tomorrow and phone rang and said call could not be completed at this time.Brayln Duque Zimmerman Rumple, CMA

## 2019-11-28 ENCOUNTER — Encounter: Payer: Self-pay | Admitting: Family Medicine

## 2019-11-28 ENCOUNTER — Other Ambulatory Visit: Payer: Self-pay

## 2019-11-28 ENCOUNTER — Ambulatory Visit (INDEPENDENT_AMBULATORY_CARE_PROVIDER_SITE_OTHER): Payer: Self-pay | Admitting: Family Medicine

## 2019-11-28 DIAGNOSIS — E236 Other disorders of pituitary gland: Secondary | ICD-10-CM | POA: Insufficient documentation

## 2019-11-28 HISTORY — DX: Other disorders of pituitary gland: E23.6

## 2019-11-28 MED ORDER — CYCLOBENZAPRINE HCL 10 MG PO TABS: 10.0000 mg | ORAL_TABLET | Freq: Three times a day (TID) | ORAL | 0 refills | Status: DC | PRN

## 2019-11-28 MED ORDER — IBUPROFEN 200 MG PO TABS: 800.0000 mg | ORAL_TABLET | Freq: Four times a day (QID) | ORAL | 0 refills | Status: DC | PRN

## 2019-11-28 MED ORDER — KETOROLAC TROMETHAMINE 30 MG/ML IJ SOLN
30.0000 mg | Freq: Once | INTRAMUSCULAR | Status: AC
  Administered 2019-11-28: 30 mg via INTRAMUSCULAR

## 2019-11-28 NOTE — Addendum Note (Signed)
Addended by: Grant Ruts on: 11/28/2019 08:45 PM   Modules accepted: Orders

## 2019-11-28 NOTE — Assessment & Plan Note (Signed)
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  She was informed that this is likely a growth that will require removal by neurosurgery. -MR brain ordered -Follow-up TSH, prolactin, LH, FSH -Referral to ophthalmology for formal assessment of visual fields placed -Referral to neurosurgery placed
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
## 2019-11-29 LAB — TSH: TSH: 1.88 u[IU]/mL (ref 0.450–4.500)

## 2019-11-29 LAB — PROLACTIN: Prolactin: 31.2 ng/mL — ABNORMAL HIGH (ref 4.8–23.3)

## 2019-11-29 LAB — FOLLICLE STIMULATING HORMONE: FSH: 8 m[IU]/mL

## 2019-11-29 LAB — LUTEINIZING HORMONE: LH: 4.2 m[IU]/mL

## 2019-12-11 ENCOUNTER — Ambulatory Visit: Payer: Self-pay

## 2019-12-14 ENCOUNTER — Telehealth: Payer: Self-pay | Admitting: Family Medicine

## 2019-12-14 NOTE — Telephone Encounter (Signed)
Call patient as noted she had not had her CXR completed and wanted to see if she was able to get to the health department for latent TB treatment.  She has been difficult getting into contact with, does not have a working cell phone. Used contacts within Dr. Baxter Flattery recent office note. Both mother and stepfather's numbers stated "cannot complete call as dialed." Reached grandfather who stated he would relay the message to call the family medicine clinic when she has a chance.  If she calls the clinic please request and ask the following question:  Please go to the [facility] imaging center at your convenience to have your CXR done, this is looking for any TB.   Has she been able to start treatment for latent TB at the health department? If not, provide this number > [facility] health department tuberculosis (TB) clinical services: [phone].   Please let me know if she has any questions or concerns, she can always MyChart me as well if she has access to Internet.  Patriciaann Clan, DO

## 2019-12-20 ENCOUNTER — Telehealth: Payer: Self-pay | Admitting: Family Medicine

## 2019-12-20 ENCOUNTER — Ambulatory Visit
Admission: RE | Admit: 2019-12-20 | Discharge: 2019-12-20 | Disposition: A | Discharge status: Home / Self Care | Payer: Self-pay | Source: Ambulatory Visit | Attending: Family Medicine | Admitting: Family Medicine

## 2019-12-20 ENCOUNTER — Other Ambulatory Visit: Payer: Self-pay | Admitting: Family Medicine

## 2019-12-20 ENCOUNTER — Other Ambulatory Visit: Payer: Self-pay

## 2019-12-20 ENCOUNTER — Inpatient Hospital Stay: Admission: RE | Admit: 2019-12-20 | Payer: Self-pay | Source: Ambulatory Visit

## 2019-12-20 DIAGNOSIS — E236 Other disorders of pituitary gland: Secondary | ICD-10-CM

## 2019-12-20 MED ORDER — GADOBENATE DIMEGLUMINE 529 MG/ML IV SOLN
10.0000 mL | Freq: Once | INTRAVENOUS | Status: AC | PRN
  Administered 2019-12-20: 10 mL via INTRAVENOUS

## 2019-12-20 NOTE — Telephone Encounter (Signed)
Arena from [facility] is calling to get clarification on the patients MRI Order. The order is for w/o contrast, but in the notes it states that this is for with and w/o. Please call to clarify since the patient is scheduled for tomorrow. jw

## 2019-12-21 ENCOUNTER — Telehealth: Payer: Self-pay | Admitting: Family Medicine

## 2019-12-21 ENCOUNTER — Other Ambulatory Visit: Payer: Self-pay | Admitting: Family Medicine

## 2019-12-21 DIAGNOSIS — E236 Other disorders of pituitary gland: Secondary | ICD-10-CM

## 2019-12-21 NOTE — Telephone Encounter (Signed)
Pt is calling and would like to know if Dr. Higinio Plan could call her to discuss her MRI results. She received the results on mychart and would like to discuss them further.   The best call back number is [phone] or [phone]

## 2019-12-21 NOTE — Telephone Encounter (Signed)
Pt calling requesting to speak with provider regarding MRI results  [phone]  Talbot Grumbling, RN

## 2019-12-21 NOTE — Telephone Encounter (Signed)
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  Spoke with on-call neurosurgeon, Dr. Marcello Moores, who reviewed the scan and recommended neurosurgery follow-up in the next 1-2 weeks.  Already had referral placed to neurosurgery, patient to call on Monday to get scheduled.    Additionally he recommended obtaining ACTH, IGF-I, T3/T4, and a.m. cortisol levels prior to neurosurgery appointment.  Recent TSH WNL and prolactin 31 three weeks ago.  She has already been referred to ophthalmology for formal visual field testing, patient to call [facility] to reschedule.  Placed future orders for labs above, patient to present to lab Monday morning, 3/8.  Patriciaann Clan, DO

## 2019-12-24 ENCOUNTER — Other Ambulatory Visit: Payer: Self-pay

## 2019-12-24 ENCOUNTER — Telehealth: Payer: Self-pay | Admitting: Family Medicine

## 2019-12-24 DIAGNOSIS — E236 Other disorders of pituitary gland: Secondary | ICD-10-CM

## 2019-12-24 NOTE — Chronic Care Management (AMB) (Signed)
  Care Management   Note  12/24/2019 Name: Virginia Cardenas MRN: [id number] DOB: 1986/02/07  Virginia Cardenas is a 34 y.o. year old female who is a primary care patient of Patriciaann Clan, DO. I reached out to [employer] by phone today in response to a referral sent by [name] Matthew's health plan.    [name] was given information about care management services today including:  1. Care management services include personalized support from designated clinical staff supervised by her physician, including individualized plan of care and coordination with other care providers 2. 24/7 contact phone numbers for assistance for urgent and routine care needs. 3. The patient may stop care management services at any time by phone call to the office staff.  Patient agreed to services and verbal consent obtained.   Follow up plan: Telephone appointment with care management team member scheduled for:12/26/2019  Glenna Durand, LPN Health Advisor, [employer] Management ??[email or web address] ??[phone]

## 2019-12-24 NOTE — Chronic Care Management (AMB) (Signed)
  Care Management   Outreach Note  12/24/2019 Name: Virginia Cardenas MRN: [id number] DOB: 05/02/1986  Referred by: Patriciaann Clan, DO Reason for referral : Care Management (CM Initial outreach unsuccessful)   An unsuccessful telephone outreach was attempted today. The patient was referred to the case management team for assistance with care management and care coordination.   Follow Up Plan: A HIPPA compliant phone message was left for the patient providing contact information and requesting a return call.  The care management team will reach out to the patient again over the next 7 days.  If patient returns call to provider office, please advise to call Embedded Care Management Care Guide Glenna Durand LPN at [phone]  Sherrey North, LPN Health Advisor, [place] Management ??[email or web address] ??[phone]

## 2019-12-25 LAB — T3, FREE: T3, Free: 2.3 pg/mL (ref 2.0–4.4)

## 2019-12-25 LAB — T4, FREE: Free T4: 0.48 ng/dL — ABNORMAL LOW (ref 0.82–1.77)

## 2019-12-25 LAB — INSULIN-LIKE GROWTH FACTOR: Insulin-Like GF-1: 53 ng/mL — ABNORMAL LOW (ref 84–281)

## 2019-12-25 LAB — CORTISOL: Cortisol: 1.2 ug/dL

## 2019-12-25 LAB — ACTH: ACTH: 23 pg/mL (ref 7.2–63.3)

## 2019-12-26 ENCOUNTER — Encounter (HOSPITAL_BASED_OUTPATIENT_CLINIC_OR_DEPARTMENT_OTHER): Payer: Self-pay

## 2019-12-26 ENCOUNTER — Ambulatory Visit: Payer: Self-pay

## 2019-12-26 ENCOUNTER — Other Ambulatory Visit: Payer: Self-pay

## 2019-12-26 ENCOUNTER — Other Ambulatory Visit: Payer: Self-pay | Admitting: Otolaryngology

## 2019-12-26 ENCOUNTER — Other Ambulatory Visit: Payer: Self-pay | Admitting: Neurosurgery

## 2019-12-26 ENCOUNTER — Ambulatory Visit (HOSPITAL_BASED_OUTPATIENT_CLINIC_OR_DEPARTMENT_OTHER): Admit: 2019-12-26 | Payer: Self-pay | Admitting: Otolaryngology

## 2019-12-26 SURGERY — SURGERY, PARANASAL SINUS, ENDOSCOPIC, WITH NASAL SEPTOPLASTY, TURBINOPLASTY, AND MAXILLARY SINUSOTOMY
Anesthesia: General

## 2019-12-26 NOTE — Chronic Care Management (AMB) (Signed)
  Care Management   Outreach Note  12/26/2019 Name: Virginia Cardenas MRN: [id number] DOB: September 25, 1986  Referred by: Patriciaann Clan, DO Reason for referral : Care Coordination ([place])   An unsuccessful telephone outreach was attempted today. The patient was referred to the case management team for assistance with care management and care coordination.   Follow Up Plan: A HIPPA compliant phone message was left for the patient providing contact information and requesting a return call. Phone number called was [phone]. The care management team will reach out to the patient again over the next 5-7 days.   Lazaro Arms RN, BSN, [facility] Care Management Coordinator [place] Phone: [phone] Fax: [phone]

## 2019-12-27 ENCOUNTER — Ambulatory Visit: Payer: Self-pay | Admitting: Licensed Clinical Social Worker

## 2019-12-27 DIAGNOSIS — Z789 Other specified health status: Secondary | ICD-10-CM

## 2019-12-27 DIAGNOSIS — Z139 Encounter for screening, unspecified: Secondary | ICD-10-CM

## 2019-12-27 NOTE — Chronic Care Management (AMB) (Addendum)
   Clinical Social Work  Care Management referral   12/27/2019 Name: Virginia Cardenas MRN: [id number] DOB: 07/16/1986  Virginia Cardenas is a 34 y.o. year old female who is a primary care patient of Virginia Clan, DO . LCSW was consulted by PCP for assistance with [employer] LCSW reached out to [employer] today by phone to introduce self, assess needs and offer Care Management interventions.  Assessment: Patient is experiencing difficulty with her medicaid application.  She needs to see a specialist and concerned about medical as well as other bills.    Recommendation: Patient may benefit from, and is in agreement to contact DSS to ask about family and children's Medicaid as her children currently receive Medicaid and she has no income at this time. Based on this information patient should qualify for assistance.  SDOH (Social Determinants of Health) assessments performed: Yes:  SDOH Interventions     Most Recent Value  SDOH Interventions  SDOH Interventions for the Following Domains  Financial Strain  Financial Strain Interventions  Other (Comment) [provided community resources]     Interventions:  1. Solution-Focused and problem solving Strategies.  2. Collaborated with RN care manager on patient's needs 3. Information left at front desk for patient to pick up ( Orange card application, Patent examiner, as well as community resources for bills)  Review of patient status, including review of consultants reports, relevant laboratory and other test results, and collaboration with appropriate care team members and the patient's provider was performed as part of comprehensive patient evaluation and provision of care management services.    Plan:   1. Patient agreed to services provided today, however does not require or desire additional follow up by LCSW 2. RN care manager will F/U with patient next week.  Casimer Lanius, LCSW Clinical Social Worker [place] / [place]   [phone] 10:13 AM

## 2019-12-27 NOTE — Patient Instructions (Signed)
Licensed Clinical Social Worker Visit Information Ms. Blanck  it was nice speaking with you. Please call me directly if you have questions [phone] Materials provided: Verbal education about medicaid options as well as community resource information  provided by phone Ms. Risser received Care Management services today:  1. Care Management services include personalized support from designated clinical staff supervised by her physician, including individualized plan of care and coordination with other care providers 2. 24/7 contact [phone] for assistance for urgent and routine care needs. 3. Care Management are voluntary services and be declined at any time by calling the office.  Patient  verbally agreed to assistance and services provided by embedded care coordination/care management team today.   Patient verbalizes understanding of instructions provided today.  Follow up plan: No follow up [place], LCSW

## 2019-12-28 ENCOUNTER — Other Ambulatory Visit: Payer: Self-pay | Admitting: Family Medicine

## 2019-12-28 DIAGNOSIS — E236 Other disorders of pituitary gland: Secondary | ICD-10-CM

## 2019-12-28 NOTE — Patient Instructions (Signed)
Visit Information  Goals Addressed            This Visit's Progress   . I have no insurance (pt-stated)       CARE PLAN ENTRY (see longtitudinal plan of care for additional care plan information)  Current Barriers:  [name] Knowledge Deficits related to community resources available for pituitary mass support and services  Nurse Case Manager Clinical Goal(s):  [name] Over the next 30 days, patient will verbalize understanding of plan   Interventions:  . Evaluation of current treatment plan  and patient's adherence to plan as established by provider. . Discussed plans with patient for ongoing care management follow up and provided patient with direct contact information for care management team . Reviewed scheduled/upcoming provider appointments including:   Appointment rescheduled for [facility] for 12/28/19 at 830 am. o She states that she is on her was to her neurosurgery appointment.  Her mother was driving her o The patient states that she would like information on the orange card.  I will talk with Kennyth Lose in our office to help her. o I have collaborated with Casimer Lanius LCSW to see what resources can be offered to her to help with Medicaid. She is planning to give the patient a call. o I will send the patient some educational information to help her understand more  about a pituitary mass. o Educational information from [facility] sent to patient about Pituitary adenoma in envelope put at the front in the office for pick up o Form for [facility] card and [facility] financial aid also put in envolope up at front  Patient Self Care Activities:  . Patient verbalizes understanding of plan Performs ADL's independently . Calls provider office for new concerns or questions . Unable to independently to navigate community resources available for  pituitary mass support and service.  Initial goal documentation        Ms. Mowdy was given information about Care Management services today including:   1. Care Management services include personalized support from designated clinical staff supervised by her physician, including individualized plan of care and coordination with other care providers 2. 24/7 contact phone numbers for assistance for urgent and routine care needs. 3. The patient may stop CCM services at any time (effective at the end of the month) by phone call to the office staff.  Patient agreed to services and verbal consent obtained.   The patient verbalized understanding of instructions provided today and declined a print copy of patient instruction materials.   The care management team will reach out to the patient again over the next 14 days.  The patient has been provided with contact information for the care management team and has been advised to call with any health related questions or concerns.   Lazaro Arms RN, BSN, [facility] Care Management Coordinator [place] Phone: [phone] Fax: [phone]

## 2019-12-28 NOTE — Chronic Care Management (AMB) (Signed)
Care Management   Initial Visit Note  12/28/2019 Name: Virginia Cardenas MRN: [id number] DOB: 09-01-1986   Assessment: Virginia Cardenas is a 34 y.o. year old female who sees Patriciaann Clan, DO for primary care. The care management team was consulted for assistance with care management and care coordination needs related to Disease Management Educational Needs and Care Coordination for Pituitary Mass.   Review of patient status, including review of consultants reports, relevant laboratory and other test results, and collaboration with appropriate care team members and the patient's provider was performed as part of comprehensive patient evaluation and provision of care management services.    SDOH (Social Determinants of Health) assessments performed: No See Care Plan activities for detailed interventions related to [facility])     Outpatient Encounter Medications as of 12/26/2019  Medication Sig  . chlorhexidine (HIBICLENS) 4 % external liquid Use with showering twice weekly over areas that get boils.  . cyclobenzaprine (FLEXERIL) 10 MG tablet Take 1 tablet (10 mg total) by mouth 3 (three) times daily as needed for muscle spasms.  [name] ibuprofen (MOTRIN IB) 200 MG tablet Take 4 tablets (800 mg total) by mouth every 6 (six) hours as needed for headache, mild pain, moderate pain or cramping.  . nicotine (NICODERM CQ - DOSED IN MG/24 HOURS) 14 mg/24hr patch Place 1 patch (14 mg total) onto the skin daily.  . nicotine (NICODERM CQ - DOSED IN MG/24 HOURS) 21 mg/24hr patch Place 1 patch (21 mg total) onto the skin daily.  . nicotine (NICODERM CQ - DOSED IN MG/24 HR) 7 mg/24hr patch Place 1 patch (7 mg total) onto the skin daily.   No facility-administered encounter medications on file as of 12/26/2019.    Goals Addressed            This Visit's Progress   . I have no insurance (pt-stated)       CARE PLAN ENTRY (see longtitudinal plan of care for additional care plan information)  Current  Barriers:  [name] Knowledge Deficits related to community resources available for pituitary mass support and services  Nurse Case Manager Clinical Goal(s):  [name] Over the next 30 days, patient will verbalize understanding of plan   Interventions:  . Evaluation of current treatment plan  and patient's adherence to plan as established by provider. . Discussed plans with patient for ongoing care management follow up and provided patient with direct contact information for care management team . Reviewed scheduled/upcoming provider appointments including:   Appointment rescheduled for [facility] for 12/28/19 at 830 am. o She states that she is on her was to her neurosurgery appointment.  Her mother was driving her o The patient states that she would like information on the orange card.  I will talk with Kennyth Lose in our office to help her. o I have collaborated with Casimer Lanius LCSW to see what resources can be offered to her to help with Medicaid. She is planning to give the patient a call. o I will send the patient some educational information to help her understand more  about a pituitary mass. o Educational information from [facility] sent to patient about Pituitary adenoma in envelope put at the front in the office for pick up o Form for [facility] card and [facility] financial aid also put in envolope up at front  Patient Self Care Activities:  . Patient verbalizes understanding of plan Performs ADL's independently . Calls provider office for new concerns or questions . Unable to independently to navigate  community resources available for  pituitary mass support and service.  Initial goal documentation         Follow up plan:  The care management team will reach out to the patient again over the next 14 days.  The patient has been provided with contact information for the care management team and has been advised to call with any health related questions or concerns.   [name] was given information  about Care Management services today including:  1. Care Management services include personalized support from designated clinical staff supervised by a physician, including individualized plan of care and coordination with other care providers 2. 24/7 contact phone numbers for assistance for urgent and routine care needs. 3. The patient may stop Care Management services at any time (effective at the end of the month) by phone call to the office staff.  Patient agreed to services and verbal consent obtained.  Lazaro Arms RN, BSN, [facility] Care Management Coordinator [place] Phone: [phone] Fax: [phone]

## 2019-12-31 ENCOUNTER — Telehealth: Payer: Self-pay

## 2020-01-04 ENCOUNTER — Ambulatory Visit: Payer: Self-pay

## 2020-01-04 ENCOUNTER — Other Ambulatory Visit: Payer: Self-pay

## 2020-01-04 NOTE — Chronic Care Management (AMB) (Signed)
  Care Management   Outreach Note  01/04/2020 Name: Virginia Cardenas MRN: [id number] DOB: [birth year]  Referred by: Patriciaann Clan, DO Reason for referral : Care Coordination (Care Management RNCM  F/U)   An unsuccessful telephone outreach was attempted today. The patient was referred to the case management team for assistance with care management and care coordination.   Follow Up Plan: A HIPPA compliant phone message was left for the patient providing contact information and requesting a return call.  The care management team will reach out to the patient again over the next 5-7 days.   Lazaro Arms RN, BSN, [facility] Care Management Coordinator [place] Phone: [phone] Fax: [phone]  Lazaro Arms RN, BSN, [facility] Care Management Coordinator [place] Phone: [phone] Fax: [phone]

## 2020-01-07 DIAGNOSIS — J342 Deviated nasal septum: Secondary | ICD-10-CM | POA: Insufficient documentation

## 2020-01-07 DIAGNOSIS — D497 Neoplasm of unspecified behavior of endocrine glands and other parts of nervous system: Secondary | ICD-10-CM | POA: Insufficient documentation

## 2020-01-07 DIAGNOSIS — Z86018 Personal history of other benign neoplasm: Secondary | ICD-10-CM | POA: Insufficient documentation

## 2020-01-07 NOTE — Progress Notes (Signed)
Your procedure is scheduled on Friday, January 11, 2020.  Report to [facility] Main Entrance "A" at 05:30 A.M., and check in at the Admitting office.  Call this number if you have problems the morning of surgery: [phone]  Call [phone] if you have any questions prior to your surgery date Monday-Friday 8am-4pm   Remember: Do not eat or drink after midnight the night before your surgery  No medicines the morning of surgery  As of today, STOP taking any Aspirin (unless otherwise instructed by your surgeon), Aleve, Naproxen, Ibuprofen, Motrin, Advil, Goody's, BC's, all herbal medications, fish oil, and all vitamins.    The Morning of Surgery  Do not wear jewelry, make-up or nail polish.  Do not wear lotions, powders, perfumes, or deodorant  Do not shave 48 hours prior to surgery.   Do not bring valuables to the hospital.  [facility] is not responsible for any belongings or valuables.  If you are a smoker, DO NOT Smoke 24 hours prior to surgery  If you wear a CPAP at night please bring your mask the morning of surgery   Remember that you must have someone to transport you home after your surgery, and remain with you for 24 hours if you are discharged the same day.   Please bring cases for contacts, glasses, hearing aids, dentures or bridgework because it cannot be worn into surgery.    Leave your suitcase in the car.  After surgery it may be brought to your room.  For patients admitted to the hospital, discharge time will be determined by your treatment team.  Patients discharged the day of surgery will not be allowed to drive home.    Special instructions:   [place]- Preparing For Surgery  Before surgery, you can play an important role. Because skin is not sterile, your skin needs to be as free of germs as possible. You can reduce the number of germs on your skin by washing with CHG (chlorahexidine gluconate) Soap before surgery.  CHG is an antiseptic cleaner  which kills germs and bonds with the skin to continue killing germs even after washing.    Oral Hygiene is also important to reduce your risk of infection.  Remember - BRUSH YOUR TEETH THE MORNING OF SURGERY WITH YOUR REGULAR TOOTHPASTE  Please do not use if you have an allergy to CHG or antibacterial soaps. If your skin becomes reddened/irritated stop using the CHG.  Do not shave (including legs and underarms) for at least 48 hours prior to first CHG shower. It is OK to shave your face.  Please follow these instructions carefully.   1. Shower the NIGHT BEFORE SURGERY and the MORNING OF SURGERY with CHG Soap.   2. If you chose to wash your hair and body, wash as usual with your normal shampoo and body-wash/soap.  3. Rinse your hair and body thoroughly to remove the shampoo and soap.  4. Apply CHG directly to the skin (ONLY FROM THE NECK DOWN) and wash gently with a scrungie or a clean washcloth.   5. Do not use on open wounds or open sores. Avoid contact with your eyes, ears, mouth and genitals (private parts). Wash Face and genitals (private parts)  with your normal soap.   6. Wash thoroughly, paying special attention to the area where your surgery will be performed.  7. Thoroughly rinse your body with warm water from the neck down.  8. DO NOT shower/wash with your normal soap after using and  rinsing off the CHG Soap.  9. Pat yourself dry with a CLEAN TOWEL.  10. Wear CLEAN PAJAMAS to bed the night before surgery  11. Place CLEAN SHEETS on your bed the night of your first shower and DO NOT SLEEP WITH PETS.  12. Wear comfortable clothes the morning of surgery.     Day of Surgery:  Please shower the morning of surgery with the CHG soap Do not apply any deodorants/lotions. Please wear clean clothes to the hospital/surgery center.   Remember to brush your teeth WITH YOUR REGULAR TOOTHPASTE.   Please read over the following fact sheets that you were given.

## 2020-01-08 ENCOUNTER — Other Ambulatory Visit (HOSPITAL_COMMUNITY)
Admission: RE | Admit: 2020-01-08 | Discharge: 2020-01-08 | Disposition: A | Discharge status: Home / Self Care | Payer: HRSA Program | Source: Ambulatory Visit | Attending: Neurosurgery | Admitting: Neurosurgery

## 2020-01-08 ENCOUNTER — Encounter (HOSPITAL_COMMUNITY)
Admission: RE | Admit: 2020-01-08 | Discharge: 2020-01-08 | Disposition: A | Discharge status: Home / Self Care | Payer: Self-pay | Source: Ambulatory Visit | Attending: Neurosurgery | Admitting: Neurosurgery

## 2020-01-08 ENCOUNTER — Other Ambulatory Visit: Payer: Self-pay

## 2020-01-08 ENCOUNTER — Encounter (HOSPITAL_COMMUNITY): Payer: Self-pay

## 2020-01-08 DIAGNOSIS — Z20822 Contact with and (suspected) exposure to covid-19: Secondary | ICD-10-CM | POA: Diagnosis not present

## 2020-01-08 DIAGNOSIS — Z01812 Encounter for preprocedural laboratory examination: Secondary | ICD-10-CM | POA: Insufficient documentation

## 2020-01-08 LAB — CBC
HCT: 39.9 % (ref 36.0–46.0)
Hemoglobin: 13 g/dL (ref 12.0–15.0)
MCH: 29.4 pg (ref 26.0–34.0)
MCHC: 32.6 g/dL (ref 30.0–36.0)
MCV: 90.3 fL (ref 80.0–100.0)
Platelets: 335 10*3/uL (ref 150–400)
RBC: 4.42 MIL/uL (ref 3.87–5.11)
RDW: 14.8 % (ref 11.5–15.5)
WBC: 5.8 10*3/uL (ref 4.0–10.5)
nRBC: 0 % (ref 0.0–0.2)

## 2020-01-08 LAB — SURGICAL PCR SCREEN
MRSA, PCR: NEGATIVE
Staphylococcus aureus: NEGATIVE

## 2020-01-08 LAB — SARS CORONAVIRUS 2 (TAT 6-24 HRS): SARS Coronavirus 2: NEGATIVE

## 2020-01-08 NOTE — Progress Notes (Signed)
PCP - [facility]  Cardiologist - Denies  Chest x-ray - Denies  EKG - Denies  Stress Test - Denies  ECHO - Denies  Cardiac Cath - Denies  AICD-na PM-na LOOP-na  Sleep Study - Denies CPAP - Denies  LABS- 01/08/20: CBC, COVID- Pt given quarantine information  ASA- Denies  ERAS- No  HA1C- Denies  Anesthesia- No  Pt denies having chest pain, sob, or fever at this time. All instructions explained to the pt, with a verbal understanding of the material. Pt agrees to go over the instructions while at home for a better understanding. Pt also instructed to self quarantine after being tested for COVID-19. The opportunity to ask questions was provided.   Coronavirus Screening  Have you experienced the following symptoms:  Cough yes/no: No Fever (>100.42F)  yes/no: No Runny nose yes/no: No Sore throat yes/no: No Difficulty breathing/shortness of breath  yes/no: No  Have you or a family member traveled in the last 14 days and where? yes/no: No   If the patient indicates "YES" to the above questions, their PAT will be rescheduled to limit the exposure to others and, the surgeon will be notified. THE PATIENT WILL NEED TO BE ASYMPTOMATIC FOR 14 DAYS.   If the patient is not experiencing any of these symptoms, the PAT nurse will instruct them to NOT bring anyone with them to their appointment since they may have these symptoms or traveled as well.   Please remind your patients and families that hospital visitation restrictions are in effect and the importance of the restrictions.

## 2020-01-10 NOTE — Anesthesia Preprocedure Evaluation (Addendum)
Anesthesia Evaluation  Patient identified by MRN, date of birth, ID band Patient awake    Reviewed: Allergy & Precautions, NPO status , Patient's Chart, lab work & pertinent test results  History of Anesthesia Complications Negative for: history of anesthetic complications  Airway Mallampati: II  TM Distance: >3 FB Neck ROM: Full    Dental no notable dental hx. (+) Dental Advisory Given   Pulmonary Current Smoker,    Pulmonary exam normal        Cardiovascular negative cardio ROS Normal cardiovascular exam     Neuro/Psych    GI/Hepatic negative GI ROS, Neg liver ROS,   Endo/Other  negative endocrine ROS  Renal/GU negative Renal ROS     Musculoskeletal negative musculoskeletal ROS (+)   Abdominal   Peds  Hematology negative hematology ROS (+)   Anesthesia Other Findings Day of surgery medications reviewed with the patient.  Reproductive/Obstetrics                            Anesthesia Physical Anesthesia Plan  ASA: III  Anesthesia Plan: General   Post-op Pain Management:    Induction: Intravenous  PONV Risk Score and Plan: 4 or greater and Ondansetron, Midazolam, Scopolamine patch - Pre-op and Treatment may vary due to age or medical condition  Airway Management Planned: Oral ETT  Additional Equipment: Arterial line  Intra-op Plan:   Post-operative Plan: Extubation in OR  Informed Consent: I have reviewed the patients History and Physical, chart, labs and discussed the procedure including the risks, benefits and alternatives for the proposed anesthesia with the patient or authorized representative who has indicated his/her understanding and acceptance.     Dental advisory given  Plan Discussed with: Anesthesiologist and CRNA  Anesthesia Plan Comments:        Anesthesia Quick Evaluation
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
## 2020-01-11 ENCOUNTER — Encounter (HOSPITAL_COMMUNITY)
Admission: RE | Disposition: A | Discharge status: Home / Self Care | Payer: Self-pay | Source: Home / Self Care | Attending: Neurosurgery

## 2020-01-11 ENCOUNTER — Inpatient Hospital Stay (HOSPITAL_COMMUNITY)
Admission: RE | Admit: 2020-01-11 | Discharge: 2020-01-15 | DRG: 615 | Disposition: A | Discharge status: Home / Self Care | Payer: Self-pay | Attending: Neurosurgery | Admitting: Neurosurgery

## 2020-01-11 ENCOUNTER — Encounter (HOSPITAL_COMMUNITY): Payer: Self-pay | Admitting: Neurosurgery

## 2020-01-11 ENCOUNTER — Other Ambulatory Visit: Payer: Self-pay

## 2020-01-11 ENCOUNTER — Inpatient Hospital Stay (HOSPITAL_COMMUNITY): Payer: Self-pay | Admitting: Certified Registered Nurse Anesthetist

## 2020-01-11 DIAGNOSIS — D497 Neoplasm of unspecified behavior of endocrine glands and other parts of nervous system: Secondary | ICD-10-CM | POA: Diagnosis present

## 2020-01-11 DIAGNOSIS — D352 Benign neoplasm of pituitary gland: Principal | ICD-10-CM | POA: Diagnosis present

## 2020-01-11 DIAGNOSIS — Z20822 Contact with and (suspected) exposure to covid-19: Secondary | ICD-10-CM | POA: Diagnosis present

## 2020-01-11 DIAGNOSIS — F17290 Nicotine dependence, other tobacco product, uncomplicated: Secondary | ICD-10-CM | POA: Diagnosis present

## 2020-01-11 HISTORY — PX: CRANIOTOMY: SHX93

## 2020-01-11 HISTORY — DX: Neoplasm of unspecified behavior of endocrine glands and other parts of nervous system: D49.7

## 2020-01-11 HISTORY — PX: SINUS ENDO WITH FUSION: SHX5329

## 2020-01-11 HISTORY — DX: Benign neoplasm of pituitary gland: D35.2

## 2020-01-11 LAB — CBC
HCT: 31.6 % — ABNORMAL LOW (ref 36.0–46.0)
Hemoglobin: 10.5 g/dL — ABNORMAL LOW (ref 12.0–15.0)
MCH: 29.6 pg (ref 26.0–34.0)
MCHC: 33.2 g/dL (ref 30.0–36.0)
MCV: 89 fL (ref 80.0–100.0)
Platelets: 302 10*3/uL (ref 150–400)
RBC: 3.55 MIL/uL — ABNORMAL LOW (ref 3.87–5.11)
RDW: 14.5 % (ref 11.5–15.5)
WBC: 8.4 10*3/uL (ref 4.0–10.5)
nRBC: 0 % (ref 0.0–0.2)

## 2020-01-11 LAB — TYPE AND SCREEN
ABO/RH(D): B POS
Antibody Screen: NEGATIVE

## 2020-01-11 LAB — ABO/RH: ABO/RH(D): B POS

## 2020-01-11 LAB — POCT PREGNANCY, URINE: Preg Test, Ur: NEGATIVE

## 2020-01-11 SURGERY — CRANIOTOMY HYPOPHYSECTOMY TRANSNASAL APPROACH
Anesthesia: General | Site: Nose

## 2020-01-11 MED ORDER — CHLORHEXIDINE GLUCONATE CLOTH 2 % EX PADS: 6.0000 | MEDICATED_PAD | Freq: Once | CUTANEOUS | Status: DC

## 2020-01-11 MED ORDER — DEXAMETHASONE SODIUM PHOSPHATE 10 MG/ML IJ SOLN
INTRAMUSCULAR | Status: AC
  Filled 2020-01-11: qty 1

## 2020-01-11 MED ORDER — ROCURONIUM BROMIDE 10 MG/ML (PF) SYRINGE
PREFILLED_SYRINGE | INTRAVENOUS | Status: AC
  Filled 2020-01-11: qty 10

## 2020-01-11 MED ORDER — THROMBIN 5000 UNITS EX SOLR
OROMUCOSAL | Status: DC | PRN
  Administered 2020-01-11: 5 mL

## 2020-01-11 MED ORDER — THROMBIN 5000 UNITS EX SOLR
CUTANEOUS | Status: AC
  Filled 2020-01-11: qty 15000

## 2020-01-11 MED ORDER — PHENYLEPHRINE HCL-NACL 10-0.9 MG/250ML-% IV SOLN
INTRAVENOUS | Status: DC | PRN
  Administered 2020-01-11: 20 ug/min via INTRAVENOUS

## 2020-01-11 MED ORDER — FENTANYL CITRATE (PF) 250 MCG/5ML IJ SOLN
INTRAMUSCULAR | Status: AC
  Filled 2020-01-11: qty 5

## 2020-01-11 MED ORDER — THROMBIN 5000 UNITS EX SOLR
CUTANEOUS | Status: DC | PRN
  Administered 2020-01-11 (×2): 5000 [IU] via TOPICAL

## 2020-01-11 MED ORDER — SODIUM CHLORIDE 0.9 % IR SOLN
Status: DC | PRN
  Administered 2020-01-11 (×2): 1000 mL

## 2020-01-11 MED ORDER — PROMETHAZINE HCL 25 MG/ML IJ SOLN: 6.2500 mg | INTRAMUSCULAR | Status: DC | PRN

## 2020-01-11 MED ORDER — MAGNESIUM CITRATE PO SOLN: 1.0000 | Freq: Once | ORAL | Status: DC | PRN

## 2020-01-11 MED ORDER — PHENYLEPHRINE HCL (PRESSORS) 10 MG/ML IV SOLN: 0.1000 mg | Freq: Once | INTRAVENOUS | Status: DC

## 2020-01-11 MED ORDER — ACETAMINOPHEN 325 MG PO TABS
650.0000 mg | ORAL_TABLET | ORAL | Status: DC | PRN
  Administered 2020-01-11 – 2020-01-14 (×7): 650 mg via ORAL
  Filled 2020-01-11 (×7): qty 2

## 2020-01-11 MED ORDER — SODIUM CHLORIDE 0.9 % IV SOLN
0.0500 ug/kg/min | INTRAVENOUS | Status: AC
  Administered 2020-01-11: .05 ug/kg/min via INTRAVENOUS
  Administered 2020-01-11 (×2): .1 ug/kg/min via INTRAVENOUS
  Filled 2020-01-11: qty 4000

## 2020-01-11 MED ORDER — ALBUMIN HUMAN 5 % IV SOLN
12.5000 g | Freq: Once | INTRAVENOUS | Status: AC
  Administered 2020-01-11: 12.5 g via INTRAVENOUS

## 2020-01-11 MED ORDER — OXYCODONE HCL 5 MG PO TABS
5.0000 mg | ORAL_TABLET | Freq: Once | ORAL | Status: AC
  Administered 2020-01-11: 5 mg via ORAL

## 2020-01-11 MED ORDER — CEFAZOLIN SODIUM-DEXTROSE 2-4 GM/100ML-% IV SOLN: 2.0000 g | INTRAVENOUS | Status: DC

## 2020-01-11 MED ORDER — LIDOCAINE-EPINEPHRINE 1 %-1:100000 IJ SOLN
INTRAMUSCULAR | Status: AC
  Filled 2020-01-11: qty 1

## 2020-01-11 MED ORDER — FENTANYL CITRATE (PF) 250 MCG/5ML IJ SOLN
INTRAMUSCULAR | Status: DC | PRN
  Administered 2020-01-11: 100 ug via INTRAVENOUS
  Administered 2020-01-11: 50 ug via INTRAVENOUS

## 2020-01-11 MED ORDER — SODIUM CHLORIDE 0.9 % IV SOLN: INTRAVENOUS | Status: DC

## 2020-01-11 MED ORDER — SUGAMMADEX SODIUM 200 MG/2ML IV SOLN
INTRAVENOUS | Status: DC | PRN
  Administered 2020-01-11: 200 mg via INTRAVENOUS

## 2020-01-11 MED ORDER — DOCUSATE SODIUM 100 MG PO CAPS
100.0000 mg | ORAL_CAPSULE | Freq: Two times a day (BID) | ORAL | Status: DC
  Administered 2020-01-11 – 2020-01-14 (×5): 100 mg via ORAL
  Filled 2020-01-11 (×5): qty 1

## 2020-01-11 MED ORDER — SENNOSIDES-DOCUSATE SODIUM 8.6-50 MG PO TABS: 1.0000 | ORAL_TABLET | Freq: Every evening | ORAL | Status: DC | PRN

## 2020-01-11 MED ORDER — ACETAMINOPHEN 500 MG PO TABS
1000.0000 mg | ORAL_TABLET | Freq: Once | ORAL | Status: AC
  Administered 2020-01-11: 1000 mg via ORAL
  Filled 2020-01-11: qty 2

## 2020-01-11 MED ORDER — MORPHINE SULFATE (PF) 2 MG/ML IV SOLN: 1.0000 mg | INTRAVENOUS | Status: DC | PRN

## 2020-01-11 MED ORDER — ONDANSETRON HCL 4 MG/2ML IJ SOLN
INTRAMUSCULAR | Status: AC
  Filled 2020-01-11: qty 2

## 2020-01-11 MED ORDER — PROMETHAZINE HCL 25 MG PO TABS: 12.5000 mg | ORAL_TABLET | ORAL | Status: DC | PRN

## 2020-01-11 MED ORDER — SODIUM CHLORIDE 0.9 % IV SOLN: INTRAVENOUS | Status: DC | PRN

## 2020-01-11 MED ORDER — PANTOPRAZOLE SODIUM 40 MG IV SOLR
40.0000 mg | Freq: Every day | INTRAVENOUS | Status: DC
  Administered 2020-01-11 – 2020-01-14 (×3): 40 mg via INTRAVENOUS
  Filled 2020-01-11 (×3): qty 40

## 2020-01-11 MED ORDER — OXYMETAZOLINE HCL 0.05 % NA SOLN
NASAL | Status: AC
  Filled 2020-01-11: qty 30

## 2020-01-11 MED ORDER — ONDANSETRON HCL 4 MG/2ML IJ SOLN
INTRAMUSCULAR | Status: DC | PRN
  Administered 2020-01-11: 4 mg via INTRAVENOUS

## 2020-01-11 MED ORDER — LIDOCAINE 2% (20 MG/ML) 5 ML SYRINGE
INTRAMUSCULAR | Status: AC
  Filled 2020-01-11: qty 5

## 2020-01-11 MED ORDER — LABETALOL HCL 5 MG/ML IV SOLN: 10.0000 mg | INTRAVENOUS | Status: DC | PRN

## 2020-01-11 MED ORDER — CEFAZOLIN SODIUM-DEXTROSE 2-4 GM/100ML-% IV SOLN
2.0000 g | INTRAVENOUS | Status: AC
  Administered 2020-01-11: 2 g via INTRAVENOUS
  Filled 2020-01-11: qty 100

## 2020-01-11 MED ORDER — CEFAZOLIN SODIUM-DEXTROSE 1-4 GM/50ML-% IV SOLN
1.0000 g | Freq: Three times a day (TID) | INTRAVENOUS | Status: AC
  Administered 2020-01-11 – 2020-01-12 (×2): 1 g via INTRAVENOUS
  Filled 2020-01-11 (×3): qty 50

## 2020-01-11 MED ORDER — FENTANYL CITRATE (PF) 100 MCG/2ML IJ SOLN
INTRAMUSCULAR | Status: AC
  Filled 2020-01-11: qty 2

## 2020-01-11 MED ORDER — PROPOFOL 10 MG/ML IV BOLUS
INTRAVENOUS | Status: AC
  Filled 2020-01-11: qty 20

## 2020-01-11 MED ORDER — MIDAZOLAM HCL 2 MG/2ML IJ SOLN
INTRAMUSCULAR | Status: AC
  Filled 2020-01-11: qty 2

## 2020-01-11 MED ORDER — ROCURONIUM BROMIDE 100 MG/10ML IV SOLN
INTRAVENOUS | Status: DC | PRN
  Administered 2020-01-11: 20 mg via INTRAVENOUS
  Administered 2020-01-11: 50 mg via INTRAVENOUS
  Administered 2020-01-11 (×2): 20 mg via INTRAVENOUS

## 2020-01-11 MED ORDER — LIDOCAINE-EPINEPHRINE 1 %-1:100000 IJ SOLN
INTRAMUSCULAR | Status: DC | PRN
  Administered 2020-01-11: 9 mL

## 2020-01-11 MED ORDER — OXYCODONE HCL 5 MG PO TABS
ORAL_TABLET | ORAL | Status: AC
  Filled 2020-01-11: qty 1

## 2020-01-11 MED ORDER — LACTATED RINGERS IV SOLN: INTRAVENOUS | Status: DC

## 2020-01-11 MED ORDER — ONDANSETRON HCL 4 MG/2ML IJ SOLN
4.0000 mg | INTRAMUSCULAR | Status: DC | PRN
  Administered 2020-01-11: 4 mg via INTRAVENOUS
  Filled 2020-01-11: qty 2

## 2020-01-11 MED ORDER — OXYMETAZOLINE HCL 0.05 % NA SOLN
NASAL | Status: DC | PRN
  Administered 2020-01-11: 1 via TOPICAL

## 2020-01-11 MED ORDER — CHLORHEXIDINE GLUCONATE CLOTH 2 % EX PADS
6.0000 | MEDICATED_PAD | Freq: Every day | CUTANEOUS | Status: DC
  Administered 2020-01-11 – 2020-01-14 (×4): 6 via TOPICAL

## 2020-01-11 MED ORDER — MIDAZOLAM HCL 5 MG/5ML IJ SOLN
INTRAMUSCULAR | Status: DC | PRN
  Administered 2020-01-11: 1 mg via INTRAVENOUS

## 2020-01-11 MED ORDER — ACETAMINOPHEN 650 MG RE SUPP: 650.0000 mg | RECTAL | Status: DC | PRN

## 2020-01-11 MED ORDER — SCOPOLAMINE 1 MG/3DAYS TD PT72
1.0000 | MEDICATED_PATCH | TRANSDERMAL | Status: DC
  Administered 2020-01-11: 1.5 mg via TRANSDERMAL
  Filled 2020-01-11: qty 1

## 2020-01-11 MED ORDER — PHENYLEPHRINE HCL-NACL 10-0.9 MG/250ML-% IV SOLN: 0.0000 ug/min | INTRAVENOUS | Status: DC

## 2020-01-11 MED ORDER — CELECOXIB 200 MG PO CAPS
200.0000 mg | ORAL_CAPSULE | Freq: Once | ORAL | Status: AC
  Administered 2020-01-11: 200 mg via ORAL
  Filled 2020-01-11: qty 1

## 2020-01-11 MED ORDER — LIDOCAINE HCL (CARDIAC) PF 100 MG/5ML IV SOSY
PREFILLED_SYRINGE | INTRAVENOUS | Status: DC | PRN
  Administered 2020-01-11: 60 mg via INTRATRACHEAL

## 2020-01-11 MED ORDER — PROPOFOL 10 MG/ML IV BOLUS
INTRAVENOUS | Status: DC | PRN
  Administered 2020-01-11: 150 mg via INTRAVENOUS

## 2020-01-11 MED ORDER — SALINE SPRAY 0.65 % NA SOLN
4.0000 | NASAL | Status: DC | PRN
  Filled 2020-01-11: qty 44

## 2020-01-11 MED ORDER — FENTANYL CITRATE (PF) 100 MCG/2ML IJ SOLN
25.0000 ug | INTRAMUSCULAR | Status: DC | PRN
  Administered 2020-01-11 (×2): 50 ug via INTRAVENOUS

## 2020-01-11 MED ORDER — HYDROCODONE-ACETAMINOPHEN 5-325 MG PO TABS
1.0000 | ORAL_TABLET | ORAL | Status: DC | PRN
  Administered 2020-01-12 – 2020-01-14 (×5): 1 via ORAL
  Filled 2020-01-11 (×5): qty 1

## 2020-01-11 MED ORDER — 0.9 % SODIUM CHLORIDE (POUR BTL) OPTIME
TOPICAL | Status: DC | PRN
  Administered 2020-01-11: 09:00:00 1000 mL

## 2020-01-11 MED ORDER — ONDANSETRON HCL 4 MG PO TABS: 4.0000 mg | ORAL_TABLET | ORAL | Status: DC | PRN

## 2020-01-11 MED ORDER — HYDROCORTISONE NA SUCCINATE PF 100 MG IJ SOLR
50.0000 mg | Freq: Once | INTRAMUSCULAR | Status: AC
  Administered 2020-01-11: 100 mg via INTRAVENOUS
  Filled 2020-01-11: qty 1

## 2020-01-11 MED ORDER — HEMOSTATIC AGENTS (NO CHARGE) OPTIME
TOPICAL | Status: DC | PRN
  Administered 2020-01-11: 1

## 2020-01-11 MED ORDER — ROCURONIUM BROMIDE 10 MG/ML (PF) SYRINGE
PREFILLED_SYRINGE | INTRAVENOUS | Status: AC
  Filled 2020-01-11: qty 20

## 2020-01-11 MED ORDER — ALBUMIN HUMAN 5 % IV SOLN
INTRAVENOUS | Status: AC
  Filled 2020-01-11: qty 250

## 2020-01-11 MED ORDER — HYDROCORTISONE NA SUCCINATE PF 100 MG IJ SOLR
INTRAMUSCULAR | Status: DC | PRN
  Administered 2020-01-11: 100 mg via INTRAVENOUS

## 2020-01-11 SURGICAL SUPPLY — 111 items
APL SKNCLS STERI-STRIP NONHPOA (GAUZE/BANDAGES/DRESSINGS) ×2
ATTRACTOMAT 16X20 MAGNETIC DRP (DRAPES) ×4 IMPLANT
BAG DECANTER FOR FLEXI CONT (MISCELLANEOUS) ×4 IMPLANT
BAND INSRT 18 STRL LF DISP RB (MISCELLANEOUS) ×4
BAND RUBBER #18 3X1/16 STRL (MISCELLANEOUS) ×8 IMPLANT
BENZOIN TINCTURE PRP APPL 2/3 (GAUZE/BANDAGES/DRESSINGS) ×4 IMPLANT
BLADE ROTATE RAD 40 4 M4 (BLADE) IMPLANT
BLADE ROTATE RAD 40 4MM M4 (BLADE)
BLADE ROTATE TRICUT 4MX13CM M4 (BLADE) ×1
BLADE ROTATE TRICUT 4X13 M4 (BLADE) ×3 IMPLANT
BLADE SURG 10 STRL SS (BLADE) ×4 IMPLANT
BLADE SURG 11 STRL SS (BLADE) ×8 IMPLANT
BLADE SURG 15 STRL LF DISP TIS (BLADE) ×4 IMPLANT
BLADE SURG 15 STRL SS (BLADE) ×8
BUR TAPER CHOANAL ATRESIA 30K (BURR) ×2 IMPLANT
CABLE BIPOLOR RESECTION CORD (MISCELLANEOUS) ×4 IMPLANT
CANISTER SUCT 3000ML PPV (MISCELLANEOUS) ×12 IMPLANT
CARTRIDGE OIL MAESTRO DRILL (MISCELLANEOUS) ×2 IMPLANT
CLOSURE WOUND 1/2 X4 (GAUZE/BANDAGES/DRESSINGS) ×1
COAGULATOR SUCT 6 FR SWTCH (ELECTROSURGICAL)
COAGULATOR SUCT SWTCH 10FR 6 (ELECTROSURGICAL) IMPLANT
COVER BACK TABLE 60X90IN (DRAPES) IMPLANT
COVER MAYO STAND STRL (DRAPES) ×4 IMPLANT
COVER WAND RF STERILE (DRAPES) ×8 IMPLANT
DIFFUSER DRILL AIR PNEUMATIC (MISCELLANEOUS) ×4 IMPLANT
DRAPE HALF SHEET 40X57 (DRAPES) ×8 IMPLANT
DRAPE INCISE IOBAN 66X45 STRL (DRAPES) ×4 IMPLANT
DRAPE MICROSCOPE LEICA (MISCELLANEOUS) IMPLANT
DRAPE SURG 17X23 STRL (DRAPES) ×12 IMPLANT
DRESSING NASAL KENNEDY 3.5X.9 (MISCELLANEOUS) IMPLANT
DRSG NASAL KENNEDY 3.5X.9 (MISCELLANEOUS)
DRSG NASOPORE 8CM (GAUZE/BANDAGES/DRESSINGS) ×2 IMPLANT
DURAPREP 26ML APPLICATOR (WOUND CARE) ×4 IMPLANT
ELECT COATED BLADE 2.86 ST (ELECTRODE) IMPLANT
ELECT NDL TIP 2.8 STRL (NEEDLE) ×2 IMPLANT
ELECT NEEDLE TIP 2.8 STRL (NEEDLE) ×4 IMPLANT
ELECT REM PT RETURN 9FT ADLT (ELECTROSURGICAL) ×8
ELECTRODE REM PT RTRN 9FT ADLT (ELECTROSURGICAL) ×4 IMPLANT
FILTER ARTHROSCOPY CONVERTOR (FILTER) ×4 IMPLANT
GAUZE PACKING FOLDED 2  STR (GAUZE/BANDAGES/DRESSINGS) ×4
GAUZE PACKING FOLDED 2 STR (GAUZE/BANDAGES/DRESSINGS) ×2 IMPLANT
GAUZE SPONGE 2X2 8PLY STRL LF (GAUZE/BANDAGES/DRESSINGS) ×2 IMPLANT
GAUZE SPONGE 4X4 12PLY STRL (GAUZE/BANDAGES/DRESSINGS) ×4 IMPLANT
GLOVE BIO SURGEON STRL SZ7.5 (GLOVE) IMPLANT
GLOVE BIOGEL M 7.0 STRL (GLOVE) ×8 IMPLANT
GLOVE BIOGEL PI IND STRL 7.5 (GLOVE) ×4 IMPLANT
GLOVE BIOGEL PI INDICATOR 7.5 (GLOVE) ×4
GLOVE ECLIPSE 7.0 STRL STRAW (GLOVE) ×4 IMPLANT
GLOVE EXAM NITRILE XL STR (GLOVE) IMPLANT
GOWN STRL REUS W/ TWL LRG LVL3 (GOWN DISPOSABLE) ×4 IMPLANT
GOWN STRL REUS W/ TWL XL LVL3 (GOWN DISPOSABLE) IMPLANT
GOWN STRL REUS W/TWL 2XL LVL3 (GOWN DISPOSABLE) ×4 IMPLANT
GOWN STRL REUS W/TWL LRG LVL3 (GOWN DISPOSABLE) ×8
GOWN STRL REUS W/TWL XL LVL3 (GOWN DISPOSABLE)
GRAFT DURAGEN MATRIX 1WX1L (Tissue) ×2 IMPLANT
HEMOSTAT POWDER KIT SURGIFOAM (HEMOSTASIS) ×4 IMPLANT
HEMOSTAT SURGICEL 2X14 (HEMOSTASIS) IMPLANT
IV CATH AUTO 14GX1.75 SAFE ORG (IV SOLUTION) ×2 IMPLANT
IV NS 1000ML (IV SOLUTION) ×4
IV NS 1000ML BAXH (IV SOLUTION) ×2 IMPLANT
KIT BASIN OR (CUSTOM PROCEDURE TRAY) ×8 IMPLANT
KIT DRAIN CSF ACCUDRAIN (MISCELLANEOUS) IMPLANT
KIT TURNOVER KIT B (KITS) ×8 IMPLANT
KNIFE ARACHNOID DISP AM-21-S (BLADE) ×2 IMPLANT
NDL 18GX1X1/2 (RX/OR ONLY) (NEEDLE) IMPLANT
NDL HYPO 25GX1X1/2 BEV (NEEDLE) ×2 IMPLANT
NDL HYPO 25X1 1.5 SAFETY (NEEDLE) ×2 IMPLANT
NDL SPNL 22GX3.5 QUINCKE BK (NEEDLE) ×2 IMPLANT
NDL SPNL 25GX3.5 QUINCKE BL (NEEDLE) IMPLANT
NEEDLE 18GX1X1/2 (RX/OR ONLY) (NEEDLE) IMPLANT
NEEDLE HYPO 25GX1X1/2 BEV (NEEDLE) ×4 IMPLANT
NEEDLE HYPO 25X1 1.5 SAFETY (NEEDLE) ×4 IMPLANT
NEEDLE SPNL 22GX3.5 QUINCKE BK (NEEDLE) ×4 IMPLANT
NEEDLE SPNL 25GX3.5 QUINCKE BL (NEEDLE) ×4 IMPLANT
NS IRRIG 1000ML POUR BTL (IV SOLUTION) ×8 IMPLANT
OIL CARTRIDGE MAESTRO DRILL (MISCELLANEOUS) ×4
PAD ARMBOARD 7.5X6 YLW CONV (MISCELLANEOUS) ×12 IMPLANT
PATTIES SURGICAL .25X.25 (GAUZE/BANDAGES/DRESSINGS) IMPLANT
PATTIES SURGICAL .5 X.5 (GAUZE/BANDAGES/DRESSINGS) IMPLANT
PATTIES SURGICAL .5 X3 (DISPOSABLE) ×4 IMPLANT
PENCIL BUTTON HOLSTER BLD 10FT (ELECTRODE) ×4 IMPLANT
SEALANT ADHERUS EXTEND TIP (MISCELLANEOUS) ×2 IMPLANT
SHEATH ENDOSCRUB 0 DEG (SHEATH) ×2 IMPLANT
SHEATH ENDOSCRUB 45 DEG (SHEATH) ×2 IMPLANT
SPECIMEN JAR SMALL (MISCELLANEOUS) ×4 IMPLANT
SPLINT NASAL DOYLE BI-VL (GAUZE/BANDAGES/DRESSINGS) IMPLANT
SPONGE GAUZE 2X2 STER 10/PKG (GAUZE/BANDAGES/DRESSINGS) ×2
SPONGE LAP 4X18 RFD (DISPOSABLE) ×4 IMPLANT
SPONGE NEURO XRAY DETECT 1X3 (DISPOSABLE) ×4 IMPLANT
SPONGE SURGIFOAM ABS GEL 12-7 (HEMOSTASIS) IMPLANT
STAPLER SKIN PROX WIDE 3.9 (STAPLE) ×4 IMPLANT
STRIP CLOSURE SKIN 1/2X4 (GAUZE/BANDAGES/DRESSINGS) ×3 IMPLANT
SUT BONE WAX W31G (SUTURE) ×4 IMPLANT
SUT ETHILON 3 0 FSL (SUTURE) IMPLANT
SUT PLAIN 4 0 ~~LOC~~ 1 (SUTURE) IMPLANT
SWAB COLLECTION DEVICE MRSA (MISCELLANEOUS) IMPLANT
SWAB CULTURE ESWAB REG 1ML (MISCELLANEOUS) IMPLANT
SYR CONTROL 10ML LL (SYRINGE) ×8 IMPLANT
TOWEL GREEN STERILE (TOWEL DISPOSABLE) ×4 IMPLANT
TOWEL GREEN STERILE FF (TOWEL DISPOSABLE) ×8 IMPLANT
TRACKER ENT INSTRUMENT (MISCELLANEOUS) ×4 IMPLANT
TRACKER ENT PATIENT (MISCELLANEOUS) ×4 IMPLANT
TRAP SPECIMEN MUCOUS 40CC (MISCELLANEOUS) IMPLANT
TRAY ENT MC OR (CUSTOM PROCEDURE TRAY) ×8 IMPLANT
TRAY FOLEY MTR SLVR 16FR STAT (SET/KITS/TRAYS/PACK) ×4 IMPLANT
TUBE CONNECTING 12'X1/4 (SUCTIONS) ×1
TUBE CONNECTING 12X1/4 (SUCTIONS) ×3 IMPLANT
TUBING EXTENTION W/L.L. (IV SETS) ×4 IMPLANT
TUBING STRAIGHTSHOT EPS 5PK (TUBING) ×4 IMPLANT
WATER STERILE IRR 1000ML POUR (IV SOLUTION) ×8 IMPLANT
WIPE INSTRUMENT VISIWIPE 73X73 (MISCELLANEOUS) ×4 IMPLANT

## 2020-01-11 NOTE — Anesthesia Postprocedure Evaluation (Signed)
Anesthesia Post Note  Patient: Virginia Cardenas  Procedure(s) Performed: ENDOSCOPIC TRANSNASAL TRANSPEHENOIDAL RESECTION OF PITUITARY TUMOR (N/A Head) SINUS ENDO WITH FUSION (N/A Nose)     Patient location during evaluation: PACU Anesthesia Type: General Level of consciousness: sedated Pain management: pain level controlled Vital Signs Assessment: post-procedure vital signs reviewed and stable Respiratory status: spontaneous breathing and respiratory function stable Cardiovascular status: stable Postop Assessment: no apparent nausea or vomiting Anesthetic complications: no    Last Vitals:  Vitals:   01/11/20 1217 01/11/20 1247  BP: (!) 140/98 (!) 145/99  Pulse: 64 69  Resp: 13 14  Temp:    SpO2: 100% 99%    Last Pain:  Vitals:   01/11/20 1146  TempSrc:   PainSc: 6                  Famous Eisenhardt DANIEL

## 2020-01-11 NOTE — Op Note (Signed)
NEUROSURGERY OPERATIVE NOTE   PREOP DIAGNOSIS:  1. Pituitary macroadenoma   POSTOP DIAGNOSIS: Same  PROCEDURE: 1. Endoscopic transnasal transsphenoidal resection of pituitary tumor 2. Use of intraoperative stereotaxy  SURGEON: Dr. Consuella Lose, MD  CO-SURGEON: Dr. [name], MD (Otolaryngology)  ANESTHESIA: General Endotracheal  EBL: 200cc  SPECIMENS: Pituitary tumor for permanent pathology  DRAINS: None  COMPLICATIONS: None immediate  CONDITION: Hemodynamically stable to PACU  HISTORY: Virginia Cardenas is a 34 y.o. female initially referred to the outpatient neurosurgery clinic with rapidly progressive visual loss.  Work-up included CT scan and MRI which demonstrated a large sellar/suprasellar tumor with compression of the optic apparatus.  Her endocrine profile was largely unremarkable.  With her visual complaints and radiologic findings, surgical resection for diagnosis and relief of mass-effect was indicated.  The risks and benefits of the surgery were reviewed in detail with the patient and her mother.  After all questions were answered informed consent was obtained and witnessed.  PROCEDURE IN DETAIL: The patient was brought to the operating room. After induction of general anesthesia, the patient was positioned on the operative table in the supine position. All pressure points were meticulously padded.  Surface markers were coregistered with the preoperative stereotactic CT scan done in Dr. Victorio Palm office.  Satisfactory accuracy was achieved in order to use the stereotactic system intraoperatively.  The mid face was then prepped in the usual sterile fashion.  After timeout was conducted, endoscopic access to the face of the sella was obtained by Dr. Wilburn Cornelia.  Details of the exposure are dictated in a separate report.  Using the stereotactic system, the bony landmarks were identified including the lateral edges of the sella, optical carotid cistern, floor of  the sella, as well as the planum sphenoidale.  The bony face of the sella was then removed using a combination of high-speed drill and Kerrison punches.  The dura of the sella was then exposed.  Dura was incised in cruciate fashion.  Immediately underneath the dura we encountered relatively soft, tan-colored tumor.  Multiple pieces of the tumor were removed using pituitary forceps and ring curettes.  The specimens were sent for permanent pathology.  Using a combination of ring curettes and the 0 degree endoscope, initially, the inferior portion of the tumor was removed.  I was then able to palpate the floor of the sella.  Dissection was then carried laterally both to the patient's right and left in order to remove tumor abutting the cavernous sinuses.  Once the sella was cleaned of tumor, we began working on the superior portion of the tumor, progressing from the dorsum sella anteriorly.  In order to aid in removal of the superior portion of the tumor, the 0 degree scope was replaced with the 45 degree scope in order to obtain better superior visualization.  Tumor was therefore removed in a piecemeal fashion.  Once the last portion of tumor was removed in the anterior/superior portion of the sella, I noted the diaphragm sella to drop down into the field.  The wound was irrigated with copious amounts of normal saline irrigation.  It did look relatively clear that the diaphragm was now sitting within the sella, without any identifiable tumor remaining.  Hemostasis was then secured using morselized Gelfoam with thrombin.  Any loose Gelfoam was then irrigated away.  The sella was covered with a small piece of collagen onlay graft.  Polyethylene glycol sealant was then applied to the face of the sella.  Closure of the nasal exposure  was then completed by Dr. Wilburn Cornelia, details again dictated separately.  At the end of the case all sponge, needle, instrument, and cottonoid counts were correct.  The patient was then  extubated, transferred to the stretcher, and taken to the postanesthesia care unit in stable hemodynamic condition.

## 2020-01-11 NOTE — Progress Notes (Signed)
   ENT Progress Note: Procedure(s): ENDOSCOPIC TRANSNASAL TRANSPEHENOIDAL RESECTION OF PITUITARY TUMOR SINUS ENDO WITH FUSION   Subjective: No H/A  Objective: Vital signs in last 24 hours: Temp:  [98 F (36.7 C)] 98 F (36.7 C) (03/26 0647) Pulse Rate:  [73] 73 (03/26 0647) Resp:  [18] 18 (03/26 0647) BP: (106)/(76) 106/76 (03/26 0647) SpO2:  [100 %] 100 % (03/26 0647) Weight:  [94.3 kg] 94.3 kg (03/26 0647) Weight change:     Intake/Output from previous day: No intake/output data recorded. Intake/Output this shift: No intake/output data recorded.  Labs: Recent Labs    01/08/20 1009  WBC 5.8  HGB 13.0  HCT 39.9  PLT 335   No results for input(s): NA, K, CL, CO2, GLUCOSE, BUN, CALCIUM in the last 72 hours.  Invalid input(s): CREATININR  Studies/Results: No results found.   PHYSICAL EXAM: EOMI No change in exam   Assessment/Plan: Pt for Endo. Trans-sphenoidal pituitary    Virginia Cardenas 01/11/2020, 7:35 AM

## 2020-01-11 NOTE — Anesthesia Procedure Notes (Signed)
Arterial Line Insertion Start/End3/26/2021 7:00 AM, 01/11/2020 7:05 AM Performed by: Lowella Dell, CRNA, CRNA  Patient location: Pre-op. Preanesthetic checklist: patient identified, IV checked, site marked, risks and benefits discussed, surgical consent, monitors and equipment checked, pre-op evaluation, timeout performed and anesthesia consent Lidocaine 1% used for infiltration and patient sedated Left, radial was placed Catheter size: 20 G Hand hygiene performed  and maximum sterile barriers used   Attempts: 1 Procedure performed without using ultrasound guided technique. Following insertion, Biopatch and dressing applied. Post procedure assessment: normal  Patient tolerated the procedure well with no immediate complications.

## 2020-01-11 NOTE — Transfer of Care (Signed)
Immediate Anesthesia Transfer of Care Note  Patient: Virginia Cardenas  Procedure(s) Performed: ENDOSCOPIC TRANSNASAL TRANSPEHENOIDAL RESECTION OF PITUITARY TUMOR (N/A Head) SINUS ENDO WITH FUSION (N/A Nose)  Patient Location: PACU  Anesthesia Type:General  Level of Consciousness: awake and patient cooperative  Airway & Oxygen Therapy: Patient Spontanous Breathing  Post-op Assessment: Post -op Vital signs reviewed and stable and Patient moving all extremities X 4  Post vital signs: Reviewed and stable  Last Vitals:  Vitals Value Taken Time  BP 130/84 01/11/20 1117  Temp 36.2 C 01/11/20 1117  Pulse 73 01/11/20 1126  Resp 15 01/11/20 1126  SpO2 98 % 01/11/20 1126  Vitals shown include unvalidated device data.  Last Pain:  Vitals:   01/11/20 1117  TempSrc:   PainSc: Asleep         Complications: No apparent anesthesia complications

## 2020-01-11 NOTE — Addendum Note (Signed)
Addendum  created 01/11/20 1325 by Lowella Dell, CRNA   Charge Capture section accepted

## 2020-01-11 NOTE — Op Note (Signed)
Operative Note:  ENDOSCOPIC TRANSSPHENOIDAL PITUITARY RESECTION WITH NAVIGATION      Patient: Cutten record number: [id number]  Date:01/11/2020  Pre-operative Indications: 1.  Pituitary Mass       Postoperative Indications: Same  Surgical Procedure: 1. Endoscopic transsphenoidal pituitary resection with intraoperative navigation    2.  Bilateral endoscopic sphenoidotomy with resection of sphenoid tissue      Anesthesia: GET  Surgeon: Delsa Bern, M.D.  Neurosurgeon: Dr. Kathyrn Sheriff  Complications: None  EBL: 150 cc  Findings: Large pituitary tumor, resected by Dr. Kathyrn Sheriff.  No CSF leak or significant bleeding. Naso-pore sphenoid packing placed.  Note: The neurosurgical component of the operative procedure is dictated as a separate operative note.   Brief History: The patient is a 34 y.o. female with a history of pituitary mass. The patient has a history of headache and visual disturbance. The patient was referred to Dr. Kathyrn Sheriff for neurosurgical evaluation.  Patient seen by me at [facility] preoperatively with review of nasal anatomy and sinus CT scan for navigation.  Given the patient's history and findings, the above surgical procedures were recommended, risks and benefits were discussed in detail with the patient.  They understand and agree with our plan for surgery which is scheduled at [facility] under general anesthesia.  Surgical Procedure: The patient is brought to the neurosurgical operating room on 01/11/2020 and placed in supine position on the operating table. General endotracheal anesthesia was established without difficulty. When the patient was adequately anesthetized, surgical timeout was performed with correct identification of the patient and the surgical procedure. The patient's nose was then injected with 9 cc of 1% lidocaine 1:100,000 dilution epinephrine which was injected in a submucosal fashion. The patient's nose was  then packed with Afrin-soaked cottonoid pledgets were left in place for approximately 10 minutes to allow for vasoconstriction and hemostasis.  The Xomed Fusion navigation headgear was applied and anatomic and surgical landmarks were identified and confirmed, navigation was used throughout the sinus component of the surgical procedure.  With the patient prepped draped and prepared for surgery, nasal endoscopy was performed on the patient's right.  The middle turbinate was carefully lateralized to allow access to the posterior aspect of the nasal passageway.  The right sphenoid sinus ostium was identified.  The inferior aspect of the superior turbinate was then resected with through-cutting forceps and a microdebrider.  The right sphenoid sinus ostium was enlarged in a superior and lateral direction using the microdebrider and through-cutting forceps to create a widely patent ostium.  Nasal endoscopy on the patient's left-hand side was then undertaken.  The left middle turbinate was lateralized and the posterior nasal cavity was visualized with identification of the left sphenoid sinus ostium using navigation.  The inferior aspect of the superior turbinate was resected and the sinus ostium was enlarged in the lateral and superior direction to create a wide sphenoid sinus ostium.  A posterior septectomy was then performed with a Surveyor, quantity.  Bone, cartilage and soft tissue was then resected to create a wide posterior septotomy.  The anterior face of the sphenoid sinus and sphenoid sinus septum were then resected with a combination of through-cutting forceps, osteotome and microdebrider to allow direct access to the entire posterior aspect of the sphenoid sinus and pituitary fossa.  Sphenoid sinus mucosa overlying the pituitary fossa was elevated and lateralized.  The anterior face of the pituitary fossa was demarcated using navigation.  With adequate access to the pituitary fossa the  neurosurgical component  of the procedure was begun by Dr. Kathyrn Sheriff.  This is dictated as a separate operative report.  Resection of the pituitary tumor was undertaken using direct visualization of the 0 degree endoscope, navigation and blunt and sharp dissection.  With pituitary tumor resection completed, reconstruction was undertaken.  Anterior sellar defect repaired with DuraGen and Adheris dural glue.  Surgicel was placed over the pituitary fossa defect and the sphenoid sinus was loosely packed with Naso-pore absorbable nasal packing.  There was no active bleeding and no evidence of spinal fluid leak.  The sphenoid sinus was carefully inspected, no further bleeding along the mucosal margins, sphenoidotomy sites or posterior septectomy.  The patient's nasal cavity was irrigated and suctioned.  Surgical sponge count was correct. An oral gastric tube was passed and the stomach contents were aspirated. Patient was awakened from anesthetic and transferred from the operating room to the recovery room in stable condition. There were no complications and blood loss was 150 cc.   Delsa Bern, M.D. [facility] 01/11/2020

## 2020-01-11 NOTE — Anesthesia Procedure Notes (Signed)
Procedure Name: Intubation Date/Time: 01/11/2020 8:02 AM Performed by: Lowella Dell, CRNA Pre-anesthesia Checklist: Patient identified, Emergency Drugs available, Suction available and Patient being monitored Patient Re-evaluated:Patient Re-evaluated prior to induction Oxygen Delivery Method: Circle System Utilized Preoxygenation: Pre-oxygenation with 100% oxygen Induction Type: IV induction Ventilation: Mask ventilation without difficulty Laryngoscope Size: Mac and 3 Grade View: Grade II Tube type: Oral Tube size: 7.0 mm Number of attempts: 1 Airway Equipment and Method: Stylet Placement Confirmation: ETT inserted through vocal cords under direct vision,  positive ETCO2 and breath sounds checked- equal and bilateral Secured at: 22 cm Tube secured with: Tape Dental Injury: Teeth and Oropharynx as per pre-operative assessment

## 2020-01-12 LAB — CBC
HCT: 28 % — ABNORMAL LOW (ref 36.0–46.0)
Hemoglobin: 9.3 g/dL — ABNORMAL LOW (ref 12.0–15.0)
MCH: 29.8 pg (ref 26.0–34.0)
MCHC: 33.2 g/dL (ref 30.0–36.0)
MCV: 89.7 fL (ref 80.0–100.0)
Platelets: 282 10*3/uL (ref 150–400)
RBC: 3.12 MIL/uL — ABNORMAL LOW (ref 3.87–5.11)
RDW: 14.8 % (ref 11.5–15.5)
WBC: 14 10*3/uL — ABNORMAL HIGH (ref 4.0–10.5)
nRBC: 0 % (ref 0.0–0.2)

## 2020-01-12 LAB — BASIC METABOLIC PANEL
Anion gap: 9 (ref 5–15)
BUN: 12 mg/dL (ref 6–20)
CO2: 22 mmol/L (ref 22–32)
Calcium: 8.7 mg/dL — ABNORMAL LOW (ref 8.9–10.3)
Chloride: 112 mmol/L — ABNORMAL HIGH (ref 98–111)
Creatinine, Ser: 0.61 mg/dL (ref 0.44–1.00)
GFR calc Af Amer: >60 mL/min (ref >60)
GFR calc non Af Amer: >60 mL/min (ref >60)
Glucose, Bld: 118 mg/dL — ABNORMAL HIGH (ref 70–99)
Potassium: 4.2 mmol/L (ref 3.5–5.1)
Sodium: 143 mmol/L (ref 135–145)

## 2020-01-12 LAB — HEMOGLOBIN AND HEMATOCRIT, BLOOD
HCT: 30.4 % — ABNORMAL LOW (ref 36.0–46.0)
Hemoglobin: 10 g/dL — ABNORMAL LOW (ref 12.0–15.0)

## 2020-01-12 LAB — OCCULT BLOOD X 1 CARD TO LAB, STOOL: Fecal Occult Bld: POSITIVE — AB

## 2020-01-12 LAB — CORTISOL-AM, BLOOD: Cortisol - AM: 12.5 ug/dL (ref 6.7–22.6)

## 2020-01-12 NOTE — Progress Notes (Signed)
Patient complaining LLE is numb. Neuro assessment remains stable. Neurosurgery suggests the feeling is due to the patient's position in bed. Will continue to monitor.

## 2020-01-12 NOTE — Plan of Care (Signed)
  Problem: Education: Goal: Knowledge of the prescribed therapeutic regimen will improve Outcome: Progressing   

## 2020-01-12 NOTE — Progress Notes (Signed)
Subjective: The patient is alert and pleasant.  She is in no apparent distress.  She says her vision is much better.  Objective: Vital signs in last 24 hours: Temp:  [96.8 F (36 C)-99.6 F (37.6 C)] 98.4 F (36.9 C) (03/27 0800) Pulse Rate:  [63-102] 101 (03/27 0800) Resp:  [7-20] 20 (03/27 0800) BP: (76-145)/(56-103) 132/92 (03/27 0800) SpO2:  [95 %-100 %] 95 % (03/27 0800) Arterial Line BP: (78-183)/(39-105) 158/92 (03/27 0800) Weight:  [95 kg] 95 kg (03/26 1845) Estimated body mass index is 33.8 kg/m as calculated from the following:   Height as of this encounter: 5\' 6"  (1.676 m).   Weight as of this encounter: 95 kg.   Intake/Output from previous day: 03/26 0701 - 03/27 0700 In: 2043.1 [I.V.:1944.5; IV Piggyback:98.6] Out: 7550 [Urine:7400; Blood:150] Intake/Output this shift: Total I/O In: 150.8 [I.V.:149.4; IV Piggyback:1.4] Out: 1950 [Urine:1950]  Physical exam the patient is alert and oriented.  Patient is grossly normal.  Her speech is normal.  She is moving all 4 extremities well.  Lab Results: Recent Labs    01/11/20 1815 01/12/20 0407  WBC 8.4 14.0*  HGB 10.5* 9.3*  HCT 31.6* 28.0*  PLT 302 282   BMET Recent Labs    01/12/20 0407  NA 143  K 4.2  CL 112*  CO2 22  GLUCOSE 118*  BUN 12  CREATININE 0.61  CALCIUM 8.7*    Studies/Results: No results found.  Assessment/Plan: Postop day #1: The patient is doing well clinically.  She has high urine output but her sodium so far has been okay.  This could be an appropriate diuresis or early DI.  We will continue to monitor for DI.  LOS: 1 day     Ophelia Charter 01/12/2020, 9:19 AM

## 2020-01-13 LAB — HEMOGLOBIN AND HEMATOCRIT, BLOOD
HCT: 27 % — ABNORMAL LOW (ref 36.0–46.0)
HCT: 26.8 % — ABNORMAL LOW (ref 36.0–46.0)
HCT: 27.8 % — ABNORMAL LOW (ref 36.0–46.0)
Hemoglobin: 9 g/dL — ABNORMAL LOW (ref 12.0–15.0)
Hemoglobin: 8.8 g/dL — ABNORMAL LOW (ref 12.0–15.0)
Hemoglobin: 9.1 g/dL — ABNORMAL LOW (ref 12.0–15.0)

## 2020-01-13 NOTE — Progress Notes (Signed)
Spoke with Dr. Constance Holster [facility]), patient may remove packing from nose as long as she's not dripping.

## 2020-01-13 NOTE — Progress Notes (Addendum)
Subjective: The patient is alert and pleasant.  She is complaining of the frequent blood draws.  She wants the Foley catheter out.  The patient had a heme positive melanotic stool yesterday.  Objective: Vital signs in last 24 hours: Temp:  [98 F (36.7 C)-99.8 F (37.7 C)] 98.4 F (36.9 C) (03/28 1200) Pulse Rate:  [73-105] 73 (03/28 1100) Resp:  [0-26] 8 (03/28 1100) BP: (91-136)/(62-101) 119/91 (03/28 1100) SpO2:  [92 %-100 %] 100 % (03/28 1100) Estimated body mass index is 33.8 kg/m as calculated from the following:   Height as of this encounter: 5\' 6"  (1.676 m).   Weight as of this encounter: 95 kg.   Intake/Output from previous day: 03/27 0701 - 03/28 0700 In: 1797.5 [I.V.:1796.1; IV Piggyback:1.4] Out: 11050 [Urine:11050] Intake/Output this shift: Total I/O In: 286.6 [I.V.:286.6] Out: 1650 [Urine:1650]  Physical exam the patient is alert and oriented.  Her vision is grossly normal.  Her speech is normal.  Her strength is normal.  There is no evidence of CSF rhinorrhea.  Lab Results: Recent Labs    01/11/20 1815 01/11/20 1815 01/12/20 0407 01/12/20 1722 01/13/20 0709 01/13/20 1035  WBC 8.4  --  14.0*  --   --   --   HGB 10.5*   < > 9.3*   < > 8.8* 9.1*  HCT 31.6*   < > 28.0*   < > 26.8* 27.8*  PLT 302  --  282  --   --   --    < > = values in this interval not displayed.   BMET Recent Labs    01/12/20 0407  NA 143  K 4.2  CL 112*  CO2 22  GLUCOSE 118*  BUN 12  CREATININE 0.61  CALCIUM 8.7*    Studies/Results: No results found.  Assessment/Plan: Postop day #2: The patient is doing well clinically.  Her urine output has tapered.  We will discontinue her Foley catheter and plan to recheck a BMP in the morning.  She may be able to go home tomorrow.  I have answered all her questions.  Heme positive/melanotic stool: If this persists she will need to follow-up with a GI doctor, perhaps this was secondary to some swallowed blood from her surgery.  LOS:  2 days     Ophelia Charter 01/13/2020, 12:42 PM

## 2020-01-13 NOTE — Plan of Care (Signed)
  Problem: Education: Goal: Knowledge of the prescribed therapeutic regimen will improve Outcome: Progressing   

## 2020-01-14 ENCOUNTER — Ambulatory Visit: Payer: Self-pay

## 2020-01-14 ENCOUNTER — Encounter: Payer: Self-pay | Admitting: *Deleted

## 2020-01-14 LAB — BASIC METABOLIC PANEL
Anion gap: 9 (ref 5–15)
BUN: 6 mg/dL (ref 6–20)
CO2: 27 mmol/L (ref 22–32)
Calcium: 8.9 mg/dL (ref 8.9–10.3)
Chloride: 107 mmol/L (ref 98–111)
Creatinine, Ser: 0.7 mg/dL (ref 0.44–1.00)
GFR calc Af Amer: >60 mL/min (ref >60)
GFR calc non Af Amer: >60 mL/min (ref >60)
Glucose, Bld: 97 mg/dL (ref 70–99)
Potassium: 3.7 mmol/L (ref 3.5–5.1)
Sodium: 143 mmol/L (ref 135–145)

## 2020-01-14 LAB — SURGICAL PATHOLOGY

## 2020-01-14 NOTE — Progress Notes (Signed)
Neurosurgery Service Progress Note  Subjective: No acute events overnight, no drainage from the nose, vision subjectively great  Objective: Vitals:   01/14/20 1300 01/14/20 1505 01/14/20 1600 01/14/20 1620  BP: 105/62 117/76 105/72   Pulse: 78   64  Resp: 13 12 16 11   Temp:   99.9 F (37.7 C)   TempSrc:   Oral   SpO2: 95%   100%  Weight:      Height:       Temp (24hrs), Avg:98.5 F (36.9 C), Min:97.6 F (36.4 C), Max:99.9 F (37.7 C)  CBC Latest Ref Rng & Units 01/13/2020 01/13/2020 01/13/2020  WBC 4.0 - 10.5 K/uL - - -  Hemoglobin 12.0 - 15.0 g/dL 9.1(L) 8.8(L) 9.0(L)  Hematocrit 36.0 - 46.0 % 27.8(L) 26.8(L) 27.0(L)  Platelets 150 - 400 K/uL - - -   BMP Latest Ref Rng & Units 01/14/2020 01/12/2020 03/09/2018  Glucose 70 - 99 mg/dL 97 118(H) 83  BUN 6 - 20 mg/dL 6 12 7   Creatinine 0.44 - 1.00 mg/dL 0.70 0.61 0.74  BUN/Creat Ratio 9 - 23 - - 9  Sodium 135 - 145 mmol/L 143 143 142  Potassium 3.5 - 5.1 mmol/L 3.7 4.2 4.2  Chloride 98 - 111 mmol/L 107 112(H) 105  CO2 22 - 32 mmol/L 27 22 22   Calcium 8.9 - 10.3 mg/dL 8.9 8.7(L) 9.3    Intake/Output Summary (Last 24 hours) at 01/14/2020 1631 Last data filed at 01/14/2020 1600 Gross per 24 hour  Intake 5956.62 ml  Output 2200 ml  Net 3756.62 ml    Current Facility-Administered Medications:  .  acetaminophen (TYLENOL) tablet 650 mg, 650 mg, Oral, Q4H PRN, 650 mg at 01/14/20 1217 **OR** acetaminophen (TYLENOL) suppository 650 mg, 650 mg, Rectal, Q4H PRN, Consuella Lose, MD .  Chlorhexidine Gluconate Cloth 2 % PADS 6 each, 6 each, Topical, Daily, Consuella Lose, MD, 6 each at 01/14/20 0949 .  docusate sodium (COLACE) capsule 100 mg, 100 mg, Oral, BID, Consuella Lose, MD, 100 mg at 01/14/20 0958 .  HYDROcodone-acetaminophen (NORCO/VICODIN) 5-325 MG per tablet 1 tablet, 1 tablet, Oral, Q4H PRN, Consuella Lose, MD, 1 tablet at 01/14/20 0453 .  labetalol (NORMODYNE) injection 10-40 mg, 10-40 mg, Intravenous, Q10 min  PRN, Consuella Lose, MD .  magnesium citrate solution 1 Bottle, 1 Bottle, Oral, Once PRN, Consuella Lose, MD .  morphine 2 MG/ML injection 1-2 mg, 1-2 mg, Intravenous, Q2H PRN, Consuella Lose, MD .  ondansetron (ZOFRAN) tablet 4 mg, 4 mg, Oral, Q4H PRN **OR** ondansetron (ZOFRAN) injection 4 mg, 4 mg, Intravenous, Q4H PRN, Consuella Lose, MD, 4 mg at 01/11/20 1918 .  pantoprazole (PROTONIX) injection 40 mg, 40 mg, Intravenous, QHS, Consuella Lose, MD, 40 mg at 01/12/20 2100 .  promethazine (PHENERGAN) tablet 12.5-25 mg, 12.5-25 mg, Oral, Q4H PRN, Consuella Lose, MD .  senna-docusate (Senokot-S) tablet 1 tablet, 1 tablet, Oral, QHS PRN, Consuella Lose, MD .  sodium chloride (OCEAN) 0.65 % nasal spray 4 spray, 4 spray, Each Nare, Q1H PRN, [name], MD   Physical Exam: AOx3, PERRL, EOMI, FS, Strength 5/5 x4, SILTx4, no drainage from nares  Assessment & Plan: 34 y.o. woman s/p TSA for pituitary tumor, recovering well.  -d/c IVF, rpt RFP in AM, if Na stable and UOP appropriate, will discharge in AM -can transfer to stepdown  Judith Part  01/14/20 4:31 PM

## 2020-01-15 LAB — RENAL FUNCTION PANEL
Albumin: 3.7 g/dL (ref 3.5–5.0)
Anion gap: 9 (ref 5–15)
BUN: 8 mg/dL (ref 6–20)
CO2: 26 mmol/L (ref 22–32)
Calcium: 8.9 mg/dL (ref 8.9–10.3)
Chloride: 107 mmol/L (ref 98–111)
Creatinine, Ser: 0.65 mg/dL (ref 0.44–1.00)
GFR calc Af Amer: >60 mL/min (ref >60)
GFR calc non Af Amer: >60 mL/min (ref >60)
Glucose, Bld: 108 mg/dL — ABNORMAL HIGH (ref 70–99)
Phosphorus: 4.5 mg/dL (ref 2.5–4.6)
Potassium: 3.5 mmol/L (ref 3.5–5.1)
Sodium: 142 mmol/L (ref 135–145)

## 2020-01-15 MED ORDER — SALINE SPRAY 0.65 % NA SOLN: 4.0000 | NASAL | 0 refills | Status: DC | PRN

## 2020-01-15 MED ORDER — HYDROCODONE-ACETAMINOPHEN 5-325 MG PO TABS: 1.0000 | ORAL_TABLET | ORAL | 0 refills | Status: DC | PRN

## 2020-01-15 NOTE — Discharge Summary (Signed)
Discharge Summary  Date of Admission: 01/11/2020  Date of Discharge: 01/15/20  Attending Physician: Consuella Lose, MD  Hospital Course: Patient was admitted following an uncomplicated endonasal transphenoidal resection of a pituitary mass. She was recovered in PACU and transferred to 4N. Her hospital course was uncomplicated, her vision remained at baseline, and she did not have any post-operative endocrinopathies. She was discharged home on 01/15/20. She will follow up in clinic in 2 weeks.  Neurologic exam at discharge:  AOx3, PERRL, EOMI, FS, TM Strength 5/5 x4, SILTx4, no drift  Discharge diagnosis: Pituitary adenoma  Judith Part, MD 01/15/20 7:45 AM

## 2020-01-15 NOTE — Progress Notes (Signed)
Neurosurgery Service Progress Note  Subjective: No acute events overnight, polyuria stopped after IVFs d/c'd  Objective: Vitals:   01/15/20 0500 01/15/20 0600 01/15/20 0700 01/15/20 0720  BP: 117/87 95/73 (!) 121/96   Pulse: 62 66 70 74  Resp: 18 14 18 20   Temp:      TempSrc:      SpO2: 100% 99% 100% 100%  Weight:      Height:       Temp (24hrs), Avg:98.6 F (37 C), Min:97.6 F (36.4 C), Max:99.9 F (37.7 C)  CBC Latest Ref Rng & Units 01/13/2020 01/13/2020 01/13/2020  WBC 4.0 - 10.5 K/uL - - -  Hemoglobin 12.0 - 15.0 g/dL 9.1(L) 8.8(L) 9.0(L)  Hematocrit 36.0 - 46.0 % 27.8(L) 26.8(L) 27.0(L)  Platelets 150 - 400 K/uL - - -   BMP Latest Ref Rng & Units 01/15/2020 01/14/2020 01/12/2020  Glucose 70 - 99 mg/dL 108(H) 97 118(H)  BUN 6 - 20 mg/dL 8 6 12   Creatinine 0.44 - 1.00 mg/dL 0.65 0.70 0.61  BUN/Creat Ratio 9 - 23 - - -  Sodium 135 - 145 mmol/L 142 143 143  Potassium 3.5 - 5.1 mmol/L 3.5 3.7 4.2  Chloride 98 - 111 mmol/L 107 107 112(H)  CO2 22 - 32 mmol/L 26 27 22   Calcium 8.9 - 10.3 mg/dL 8.9 8.9 8.7(L)    Intake/Output Summary (Last 24 hours) at 01/15/2020 [id number] Last data filed at 01/15/2020 0600 Gross per 24 hour  Intake 5055.92 ml  Output 6175 ml  Net -1119.08 ml    Current Facility-Administered Medications:  .  acetaminophen (TYLENOL) tablet 650 mg, 650 mg, Oral, Q4H PRN, 650 mg at 01/14/20 1828 **OR** acetaminophen (TYLENOL) suppository 650 mg, 650 mg, Rectal, Q4H PRN, Consuella Lose, MD .  Chlorhexidine Gluconate Cloth 2 % PADS 6 each, 6 each, Topical, Daily, Consuella Lose, MD, 6 each at 01/14/20 0949 .  docusate sodium (COLACE) capsule 100 mg, 100 mg, Oral, BID, Consuella Lose, MD, 100 mg at 01/14/20 2012 .  HYDROcodone-acetaminophen (NORCO/VICODIN) 5-325 MG per tablet 1 tablet, 1 tablet, Oral, Q4H PRN, Consuella Lose, MD, 1 tablet at 01/14/20 2012 .  labetalol (NORMODYNE) injection 10-40 mg, 10-40 mg, Intravenous, Q10 min PRN, Consuella Lose, MD .  magnesium citrate solution 1 Bottle, 1 Bottle, Oral, Once PRN, Consuella Lose, MD .  morphine 2 MG/ML injection 1-2 mg, 1-2 mg, Intravenous, Q2H PRN, Consuella Lose, MD .  ondansetron (ZOFRAN) tablet 4 mg, 4 mg, Oral, Q4H PRN **OR** ondansetron (ZOFRAN) injection 4 mg, 4 mg, Intravenous, Q4H PRN, Consuella Lose, MD, 4 mg at 01/11/20 1918 .  pantoprazole (PROTONIX) injection 40 mg, 40 mg, Intravenous, QHS, Consuella Lose, MD, 40 mg at 01/14/20 2012 .  promethazine (PHENERGAN) tablet 12.5-25 mg, 12.5-25 mg, Oral, Q4H PRN, Consuella Lose, MD .  senna-docusate (Senokot-S) tablet 1 tablet, 1 tablet, Oral, QHS PRN, Consuella Lose, MD .  sodium chloride (OCEAN) 0.65 % nasal spray 4 spray, 4 spray, Each Nare, Q1H PRN, [name], MD   Physical Exam: AOx3, PERRL, EOMI, FS, Strength 5/5 x4, SILTx4, no drainage from nares  Assessment & Plan: 34 y.o. woman s/p TSA for pituitary tumor, recovering well.  -Na stable off IVF, urine concentrating, will discharge home today  Judith Part  01/15/20 7:42 AM

## 2020-01-15 NOTE — Discharge Instructions (Signed)
Discharge Instructions  No restriction in activities, slowly increase your activity back to normal. Minimize sneezing through your nose and try to sneeze through your mouth. Do not blow your nose or insert anything into your nose. Use ocean spray (sodium chloride) for any nasal dryness or discomfort.  Okay to shower on the day of discharge.   Follow up with Dr. Kathyrn Sheriff in 2 weeks after discharge. If you do not already have a discharge appointment, please call his office at [phone] to schedule a follow up appointment. If you have any concerns or questions, please call the office and let us know.

## 2020-01-17 DIAGNOSIS — E893 Postprocedural hypopituitarism: Secondary | ICD-10-CM | POA: Insufficient documentation

## 2020-01-28 ENCOUNTER — Telehealth: Payer: Self-pay | Admitting: Family Medicine

## 2020-01-28 ENCOUNTER — Other Ambulatory Visit: Payer: Self-pay | Admitting: Family Medicine

## 2020-01-28 MED ORDER — CLINDAMYCIN HCL 300 MG PO CAPS: 300.0000 mg | ORAL_CAPSULE | Freq: Two times a day (BID) | ORAL | 0 refills | Status: DC

## 2020-01-28 NOTE — Chronic Care Management (AMB) (Signed)
Care Management   Follow Up Note   01/28/2020 Name: Virginia Cardenas MRN: [id number] DOB: 29-Aug-1986  Referred by: Virginia Clan, DO Reason for referral : Care Coordination (Care Management RNCM Follow up )   Virginia Cardenas is a 34 y.o. year old female who is a primary care patient of Virginia Clan, DO. The care management team was consulted for assistance with care management and care coordination needs.    Review of patient status, including review of consultants reports, relevant laboratory and other test results, and collaboration with appropriate care team members and the patient's provider was performed as part of comprehensive patient evaluation and provision of chronic care management services.    SDOH (Social Determinants of Health) assessments performed: No See Care Plan activities for detailed interventions related to [facility])     Advanced Directives: See Care Plan and [employer] application for related entries.   Goals Addressed            This Visit's Progress   . I have no insurance (pt-stated)       CARE PLAN ENTRY (see longtitudinal plan of care for additional care plan information)  Current Barriers:  Virginia Cardenas Knowledge Deficits related to community resources available for pituitary mass support and services  Nurse Case Manager Clinical Goal(s):  Virginia Cardenas Over the next 30 days, patient will verbalize understanding of plan   Interventions:  . Evaluation of current treatment plan  and patient's adherence to plan as established by provider. . Discussed plans with patient for ongoing care management follow up and provided patient with direct contact information for care management team . Reviewed scheduled/upcoming provider appointments including:   Appointment rescheduled for [facility] for 12/28/19 at 830 am. o She states that she is on her was to her neurosurgery appointment.  Her mother was driving her o The patient states that she would like information on the orange  card.  I will talk with Virginia Cardenas in our office to help her. o I have collaborated with Virginia Lanius LCSW to see what resources can be offered to her to help with Medicaid. She is planning to give the patient a call. o I will send the patient some educational information to help her understand more  about a pituitary mass. o Educational information from [facility] sent to patient about Pituitary adenoma in envelope put at the front in the office for pick up o Form for Virginia Cardenas card and [facility] financial aid also put in envolope up at front o 01/14/20 o Spoke with the patient and her mother While in the hospital She was doing well o The patient was still having some blurred vision and minor headaches o Spoke with the mother about coming to the office and picking up papers on the orange card and [facility] financial assistance o Mother asked if I would call medicaid to see progress.  I explained because I was the nurse they may not shre any information with me.  She stated she under stood.  Patient Self Care Activities:  . Patient verbalizes understanding of plan Performs ADL's independently . Calls provider office for new concerns or questions . Unable to independently to navigate community resources available for  pituitary mass support and service.  Initial goal documentation         The patient has been provided with contact information for the care management team and has been advised to call with any health related questions or concerns.   Lazaro Arms RN, BSN, [facility]  Care Management Coordinator [place] Phone: [phone] Fax: [phone]

## 2020-01-28 NOTE — Progress Notes (Signed)
Previously diagnosed with bacterial vaginosis.  Allergic reaction to metronidazole.  Prescribed intravaginal clindamycin but this was prohibitively expensive.  Prescribed oral clindamycin.  Per dynamed, equally as effective as oral metronidazole in the treatment of BV.

## 2020-01-28 NOTE — Patient Instructions (Signed)
Visit Information  Goals Addressed            This Visit's Progress   . I have no insurance (pt-stated)       CARE PLAN ENTRY (see longtitudinal plan of care for additional care plan information)  Current Barriers:  [name] Knowledge Deficits related to community resources available for pituitary mass support and services  Nurse Case Manager Clinical Goal(s):  [name] Over the next 30 days, patient will verbalize understanding of plan   Interventions:  . Evaluation of current treatment plan  and patient's adherence to plan as established by provider. . Discussed plans with patient for ongoing care management follow up and provided patient with direct contact information for care management team . Reviewed scheduled/upcoming provider appointments including:   Appointment rescheduled for [facility] for 12/28/19 at 830 am. o She states that she is on her was to her neurosurgery appointment.  Her mother was driving her o The patient states that she would like information on the orange card.  I will talk with Kennyth Lose in our office to help her. o I have collaborated with Casimer Lanius LCSW to see what resources can be offered to her to help with Medicaid. She is planning to give the patient a call. o I will send the patient some educational information to help her understand more  about a pituitary mass. o Educational information from [facility] sent to patient about Pituitary adenoma in envelope put at the front in the office for pick up o Form for [facility] card and [facility] financial aid also put in envolope up at front o 01/14/20 o Spoke with the patient and her mother While in the hospital She was doing well o The patient was still having some blurred vision and minor headaches o Spoke with the mother about coming to the office and picking up papers on the orange card and [facility] financial assistance o Mother asked if I would call medicaid to see progress.  I explained because I was the nurse they may not shre any  information with me.  She stated she under stood.  Patient Self Care Activities:  . Patient verbalizes understanding of plan Performs ADL's independently . Calls provider office for new concerns or questions . Unable to independently to navigate community resources available for  pituitary mass support and service.  Initial goal documentation        Virginia Cardenas was given information about Care Management services today including:  1. Care Management services include personalized support from designated clinical staff supervised by her physician, including individualized plan of care and coordination with other care providers 2. 24/7 contact phone numbers for assistance for urgent and routine care needs. 3. The patient may stop CCM services at any time (effective at the end of the month) by phone call to the office staff.  Patient agreed to services and verbal consent obtained.   The patient verbalized understanding of instructions provided today and declined a print copy of patient instruction materials.   The patient has been provided with contact information for the care management team and has been advised to call with any health related questions or concerns.   Lazaro Arms RN, BSN, [facility] Care Management Coordinator [place] Phone: [phone] Fax: [phone]

## 2020-01-29 ENCOUNTER — Ambulatory Visit: Payer: Self-pay

## 2020-01-29 ENCOUNTER — Other Ambulatory Visit: Payer: Self-pay

## 2020-01-29 NOTE — Patient Instructions (Signed)
Visit Information  Goals Addressed            This Visit's Progress   . I have no insurance (pt-stated)       CARE PLAN ENTRY (see longtitudinal plan of care for additional care plan information)  Current Barriers:  [name] Knowledge Deficits related to community resources available for pituitary mass support and services  Nurse Case Manager Clinical Goal(s):  [name] Over the next 30 days, patient will verbalize understanding of plan   Interventions:  . Evaluation of current treatment plan  and patient's adherence to plan as established by provider. . Discussed plans with patient for ongoing care management follow up and provided patient with direct contact information for care management team . Reviewed scheduled/upcoming provider appointments including:   Appointment rescheduled for [facility] for 12/28/19 at 830 am. o She states that she is on her was to her neurosurgery appointment.  Her mother was driving her o The patient states that she would like information on the orange card.  I will talk with Kennyth Lose in our office to help her. o I have collaborated with Casimer Lanius LCSW to see what resources can be offered to her to help with Medicaid. She is planning to give the patient a call. o I will send the patient some educational information to help her understand more  about a pituitary mass. o Educational information from [facility] sent to patient about Pituitary adenoma in envelope put at the front in the office for pick up o Form for [facility] card and [facility] financial aid also put in envolope up at front o 01/14/20 o Spoke with the patient and her mother While in the hospital She was doing well o The patient was still having some blurred vision and minor headaches o Spoke with the mother about coming to the office and picking up papers on the orange card and [facility] financial assistance o Mother asked if I would call medicaid to see progress.  I explained because I was the nurse they may not shre any  information with me.  She stated she under stood. o 01/29/20 o Patient states that she is doing better o She states that her vision is better .  She has some peripheral vison in her right and left eye o She has a headache every now and then o Sh has stopped smoking and gained 12 lbs. o She is going to start walking o She received papers from [facility] that she needs fill out and send back and find out If she will be approved o She returned the orange card and cone financial papers to the office yesterday.  Patient Self Care Activities:  . Patient verbalizes understanding of plan Performs ADL's independently . Calls provider office for new concerns or questions . Unable to independently to navigate community resources available for  pituitary mass support and service.  Please see past updates related to this goal by clicking on the "Past Updates" button in the selected goal         Virginia Cardenas was given information about Care Management services today including:  1. Care Management services include personalized support from designated clinical staff supervised by her physician, including individualized plan of care and coordination with other care providers 2. 24/7 contact phone numbers for assistance for urgent and routine care needs. 3. The patient may stop CCM services at any time (effective at the end of the month) by phone call to the office staff.  Patient agreed to  services and verbal consent obtained.   The patient verbalized understanding of instructions provided today and declined a print copy of patient instruction materials.   The care management team will reach out to the patient again over the next 14 days.  The patient has been provided with contact information for the care management team and has been advised to call with any health related questions or concerns.   Lazaro Arms RN, BSN, [facility] Care Management Coordinator [place] Phone:  [phone] Fax: [phone]

## 2020-01-29 NOTE — Chronic Care Management (AMB) (Signed)
Care Management   Follow Up Note   01/29/2020 Name: Virginia Cardenas MRN: [id number] DOB: June 21, 1986  Referred by: Patriciaann Clan, DO Reason for referral : Care Coordination (Care Management RNCM Paper work )   Virginia Cardenas is a 34 y.o. year old female who is a primary care patient of Patriciaann Clan, DO. The care management team was consulted for assistance with care management and care coordination needs.    Review of patient status, including review of consultants reports, relevant laboratory and other test results, and collaboration with appropriate care team members and the patient's provider was performed as part of comprehensive patient evaluation and provision of chronic care management services.    SDOH (Social Determinants of Health) assessments performed: No See Care Plan activities for detailed interventions related to [facility])     Advanced Directives: See Care Plan and Vynca application for related entries.   Goals Addressed            This Visit's Progress   . I have no insurance (pt-stated)       CARE PLAN ENTRY (see longtitudinal plan of care for additional care plan information)  Current Barriers:  [name] Knowledge Deficits related to community resources available for pituitary mass support and services  Nurse Case Manager Clinical Goal(s):  [name] Over the next 30 days, patient will verbalize understanding of plan   Interventions:  . Evaluation of current treatment plan  and patient's adherence to plan as established by provider. . Discussed plans with patient for ongoing care management follow up and provided patient with direct contact information for care management team . Reviewed scheduled/upcoming provider appointments including:   Appointment rescheduled for [place] for 12/28/19 at 830 am. o She states that she is on her was to her neurosurgery appointment.  Her mother was driving her o The patient states that she would like information on the orange  card.  I will talk with Kennyth Lose in our office to help her. o I have collaborated with Casimer Lanius LCSW to see what resources can be offered to her to help with Medicaid. She is planning to give the patient a call. o I will send the patient some educational information to help her understand more  about a pituitary mass. o Educational information from [facility] sent to patient about Pituitary adenoma in envelope put at the front in the office for pick up o Form for [facility] card and [facility] financial aid also put in envolope up at front o 01/14/20 o Spoke with the patient and her mother While in the hospital She was doing well o The patient was still having some blurred vision and minor headaches o Spoke with the mother about coming to the office and picking up papers on the orange card and [facility] financial assistance o Mother asked if I would call medicaid to see progress.  I explained because I was the nurse they may not shre any information with me.  She stated she under stood. o 01/29/20 o Patient states that she is doing better o She states that her vision is better .  She has some peripheral vison in her right and left eye o She has a headache every now and then o Sh has stopped smoking and gained 12 lbs. o She is going to start walking o She received papers from medicaid that she needs fill out and send back and find out If she will be approved o She returned the orange card and  [facility] financial papers to the office yesterday.  Patient Self Care Activities:  . Patient verbalizes understanding of plan Performs ADL's independently . Calls provider office for new concerns or questions . Unable to independently to navigate community resources available for  pituitary mass support and service.  Please see past updates related to this goal by clicking on the "Past Updates" button in the selected goal          The care management team will reach out to the patient again over the next 14 days.  The  patient has been provided with contact information for the care management team and has been advised to call with any health related questions or concerns.   Lazaro Arms RN, BSN, [facility] Care Management Coordinator [place] Phone: [phone] Fax: [phone]

## 2020-01-29 NOTE — Chronic Care Management (AMB) (Signed)
  Care Management   Outreach Note  01/29/2020 Name: Virginia Cardenas MRN: [id number] DOB: December 23, 1985  Referred by: Patriciaann Clan, DO Reason for referral : Care Coordination (Care Manangment RNCM F/U Paper work)   An unsuccessful telephone outreach was attempted today. The patient was referred to the case management team for assistance with care management and care coordination.  Mother answered the phone and was on a called.  She stated that she would let the patient know that I called and have her to call me back.  Follow Up Plan: A HIPPA compliant phone message was left for the patient providing contact information and requesting a return call.  The care management team will reach out to the patient again over the next 7-14 days.   Lazaro Arms RN, BSN, [facility] Care Management Coordinator [place] Phone: [phone] Fax: [phone]

## 2020-02-01 ENCOUNTER — Other Ambulatory Visit: Payer: Self-pay

## 2020-02-05 ENCOUNTER — Ambulatory Visit: Payer: Self-pay | Admitting: Internal Medicine

## 2020-02-05 DIAGNOSIS — Z0289 Encounter for other administrative examinations: Secondary | ICD-10-CM

## 2020-02-05 NOTE — Progress Notes (Deleted)
Name: Virginia Cardenas  MRN/ DOB: [id number], [birth year]    Age/ Sex: 34 y.o., female    PCP: Patriciaann Clan, DO   Reason for Endocrinology Evaluation: ***     Date of Initial Endocrinology Evaluation: 02/05/2020     HPI: Virginia Cardenas is a 34 y.o. female with a past medical history of ***. The patient presented for initial endocrinology clinic visit on 02/05/2020 for consultative assistance with her ***.   ***  HISTORY:   Past Medical History: No past medical history on file. Past Surgical History:  Past Surgical History:  Procedure Laterality Date  . CESAREAN SECTION    . 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 SINUS ENDO WITH FUSION N/A 01/11/2020   Procedure: SINUS ENDO WITH FUSION;  Surgeon: [name], MD;  Location: [place];  Service: ENT;  Laterality: N/A;  SINUS ENDO WITH FUSION      Social History:  reports that she has been smoking cigars. She has been smoking about 0.00 packs per day for the past 0.50 years. She has never used smokeless tobacco. She reports current alcohol use. She reports that she does not use drugs.  Family History: family history is not on file.   HOME MEDICATIONS: Allergies as of 02/05/2020      Reactions   Latex Rash   Metronidazole Rash   See photo of rash in office visit note from 06/01/16      Medication List       Accurate as of February 05, 2020  1:11 PM. If you have any questions, ask your nurse or doctor.        clindamycin 300 MG capsule Commonly known as: CLEOCIN Take 1 capsule (300 mg total) by mouth in the morning and at bedtime.   HYDROcodone-acetaminophen 5-325 MG tablet Commonly known as: NORCO/VICODIN Take 1 tablet by mouth every 4 (four) hours as needed (pain).   sodium  chloride 0.65 % Soln nasal spray Commonly known as: OCEAN Place 4 sprays into both nostrils every hour as needed for up to 14 days for congestion.         REVIEW OF SYSTEMS: A comprehensive ROS was conducted with the patient and is negative except as per HPI and below:  ROS     OBJECTIVE:  VS: There were no vitals taken for this visit.   Wt Readings from Last 3 Encounters:  01/11/20 209 lb 7 oz (95 kg)  01/08/20 208 lb (94.3 kg)  11/28/19 205 lb 6 oz (93.2 kg)     EXAM: General: Pt appears well and is in NAD  Hydration: Well-hydrated with moist mucous membranes and good skin turgor  Eyes: External eye exam normal without stare, lid lag or exophthalmos.  EOM intact.  PERRL.  Ears, Nose, Throat: Hearing: Grossly intact bilaterally Dental: Good dentition  Throat: Clear without mass, erythema or exudate  Neck: General: Supple without adenopathy. Thyroid: Thyroid size normal.  No goiter or nodules appreciated. No thyroid bruit.  Lungs: Clear with good BS bilat with no rales, rhonchi, or wheezes  Heart: Auscultation: RRR.  Abdomen: Normoactive bowel sounds, soft, nontender, without masses or organomegaly palpable  Extremities: Gait and station: Normal gait  Digits and nails: No clubbing, cyanosis, petechiae, or nodes Head and neck: Normal alignment and mobility BL  UE: Normal ROM and strength. BL LE: No pretibial edema normal ROM and strength.  Skin: Hair: Texture and amount normal with gender appropriate distribution Skin Inspection: No rashes, acanthosis nigricans/skin tags. No lipohypertrophy Skin Palpation: Skin temperature, texture, and thickness normal to palpation  Neuro: Cranial nerves: II - XII grossly intact  Cerebellar: Normal coordination and movement; no tremor Motor: Normal strength throughout DTRs: 2+ and symmetric in UE without delay in relaxation phase  Mental Status: Judgment, insight: Intact Orientation: Oriented to time, place, and person Memory: Intact  for recent and remote events Mood and affect: No depression, anxiety, or agitation     DATA REVIEWED: ***    ASSESSMENT/PLAN/RECOMMENDATIONS:   1. ***    Medications :  Signed electronically by: Mack Guise, MD  [facility]  [facility] [address] [place], [place] [address] Phone: [phone] FAX: [phone]   CC: Patriciaann Clan, DO [address] [place] [address] Phone: [phone] Fax: [phone]   Return to Endocrinology clinic as below: Future Appointments  Date Time Provider [place]  02/05/2020  3:20 PM Romualdo Prosise, Melanie Crazier, MD [facility] None  02/08/2020  9:30 AM [facility]-CASE MANAGER [facility] [place]

## 2020-02-08 ENCOUNTER — Telehealth: Payer: Self-pay

## 2020-02-08 ENCOUNTER — Ambulatory Visit: Payer: Self-pay

## 2020-02-08 ENCOUNTER — Other Ambulatory Visit: Payer: Self-pay

## 2020-02-08 NOTE — Chronic Care Management (AMB) (Signed)
Care Management   Follow Up Note   02/08/2020 Name: Virginia Cardenas MRN: [id number] DOB: [birth year]  Referred by: Patriciaann Clan, DO Reason for referral : Care Coordination (Care Management RNCM F/U Paperwork)   Virginia Cardenas is a 34 y.o. year old female who is a primary care patient of Patriciaann Clan, DO. The care management team was consulted for assistance with care management and care coordination needs.    Review of patient status, including review of consultants reports, relevant laboratory and other test results, and collaboration with appropriate care team members and the patient's provider was performed as part of comprehensive patient evaluation and provision of chronic care management services.    SDOH (Social Determinants of Health) assessments performed: No See Care Plan activities for detailed interventions related to [facility])     Advanced Directives: See Care Plan and Vynca application for related entries.   Goals Addressed            This Visit's Progress   . I have no insurance (pt-stated)       CARE PLAN ENTRY (see longtitudinal plan of care for additional care plan information)  Current Barriers:  [name] Knowledge Deficits related to community resources available for pituitary mass support and services  Nurse Case Manager Clinical Goal(s):  [name] Over the next 30 days, patient will verbalize understanding of plan   Interventions:  . Evaluation of current treatment plan  and patient's adherence to plan as established by provider. . Discussed plans with patient for ongoing care management follow up and provided patient with direct contact information for care management team . Reviewed scheduled/upcoming provider appointments including:   Appointment rescheduled for [facility] for 12/28/19 at 830 am. o She states that she is on her was to her neurosurgery appointment.  Her mother was driving her o The patient states that she would like information on the  orange card.  I will talk with Kennyth Lose in our office to help her. o I have collaborated with Casimer Lanius LCSW to see what resources can be offered to her to help with Medicaid. She is planning to give the patient a call. o I will send the patient some educational information to help her understand more  about a pituitary mass. o Educational information from [facility] sent to patient about Pituitary adenoma in envelope put at the front in the office for pick up o Form for [facility] card and [facility] financial aid also put in envolope up at front o 01/14/20 o Spoke with the patient and her mother While in the hospital She was doing well o The patient was still having some blurred vision and minor headaches o Spoke with the mother about coming to the office and picking up papers on the orange card and [facility] financial assistance o Mother asked if I would call medicaid to see progress.  I explained because I was the nurse they may not shre any information with me.  She stated she under stood. o 01/29/20 o Patient states that she is doing better o She states that her vision is better .  She has some peripheral vison in her right and left eye o She has a headache every now and then o Sh has stopped smoking and gained 12 lbs. o She is going to start walking o She received papers from medicaid that she needs fill out and send back and find out If she will be approved o She returned the orange card and cone  financial papers to the office yesterday. o 02/08/20 o Patient states that she is doing well.  Her back is a little sore but no pain o She received some paperwork from the her case work needing more information for medicaid  o I updated her and let her know they are still working on the orange care and [facility] financial    Patient Self Care Activities:  . Patient verbalizes understanding of plan Performs ADL's independently . Calls provider office for new concerns or questions . Unable to independently to  navigate community resources available for  pituitary mass support and service.  Please see past updates related to this goal by clicking on the "Past Updates" button in the selected goal          The care management team will reach out to the patient again over the next 21 days.  The patient has been provided with contact information for the care management team and has been advised to call with any health related questions or concerns.   Lazaro Arms RN, BSN, [facility] Care Management Coordinator [place] Phone: [phone] Fax: [phone]

## 2020-02-08 NOTE — Patient Instructions (Signed)
Visit Information  Goals Addressed            This Visit's Progress   . I have no insurance (pt-stated)       CARE PLAN ENTRY (see longtitudinal plan of care for additional care plan information)  Current Barriers:  [name] Knowledge Deficits related to community resources available for pituitary mass support and services  Nurse Case Manager Clinical Goal(s):  [name] Over the next 30 days, patient will verbalize understanding of plan   Interventions:  . Evaluation of current treatment plan  and patient's adherence to plan as established by provider. . Discussed plans with patient for ongoing care management follow up and provided patient with direct contact information for care management team . Reviewed scheduled/upcoming provider appointments including:   Appointment rescheduled for [facility] for 12/28/19 at 830 am. o She states that she is on her was to her neurosurgery appointment.  Her mother was driving her o The patient states that she would like information on the orange card.  I will talk with Kennyth Lose in our office to help her. o I have collaborated with Casimer Lanius LCSW to see what resources can be offered to her to help with Medicaid. She is planning to give the patient a call. o I will send the patient some educational information to help her understand more  about a pituitary mass. o Educational information from [facility] sent to patient about Pituitary adenoma in envelope put at the front in the office for pick up o Form for [facility] card and [facility] financial aid also put in envolope up at front o 01/14/20 o Spoke with the patient and her mother While in the hospital She was doing well o The patient was still having some blurred vision and minor headaches o Spoke with the mother about coming to the office and picking up papers on the orange card and [facility] financial assistance o Mother asked if I would call medicaid to see progress.  I explained because I was the nurse they may not shre any  information with me.  She stated she under stood. o 01/29/20 o Patient states that she is doing better o She states that her vision is better .  She has some peripheral vison in her right and left eye o She has a headache every now and then o Sh has stopped smoking and gained 12 lbs. o She is going to start walking o She received papers from [facility] that she needs fill out and send back and find out If she will be approved o She returned the orange card and [facility] financial papers to the office yesterday. o 02/08/20 o Patient states that she is doing well.  Her back is a little sore but no pain o She received some paperwork from the her case work needing more information for medicaid  o I updated her and let her know they are still working on the orange care and [facility] financial    Patient Self Care Activities:  . Patient verbalizes understanding of plan Performs ADL's independently . Calls provider office for new concerns or questions . Unable to independently to navigate community resources available for  pituitary mass support and service.  Please see past updates related to this goal by clicking on the "Past Updates" button in the selected goal         Virginia Cardenas was given information about Care Management services today including:  1. Care Management services include personalized support from designated clinical  staff supervised by her physician, including individualized plan of care and coordination with other care providers 2. 24/7 contact phone numbers for assistance for urgent and routine care needs. 3. The patient may stop CCM services at any time (effective at the end of the month) by phone call to the office staff.  Patient agreed to services and verbal consent obtained.   The patient verbalized understanding of instructions provided today and declined a print copy of patient instruction materials.   The care management team will reach out to the patient again over the next  21 days.  The patient has been provided with contact information for the care management team and has been advised to call with any health related questions or concerns.   Lazaro Arms RN, BSN, [facility] Care Management Coordinator [place] Phone: [phone] Fax: [phone]

## 2020-02-13 ENCOUNTER — Telehealth: Payer: Self-pay

## 2020-02-13 ENCOUNTER — Other Ambulatory Visit: Payer: Self-pay | Admitting: Family Medicine

## 2020-02-13 NOTE — Telephone Encounter (Signed)
Patient calls nurse line stating she was never able to pick up her Cleocin prescription from the pharmacy sent on 4/12. Patient stated she never got a call from them stating "something was ready." Patient was given Cleocin 300mg  to treat looks like BV earlier this month, however patient is requesting for this to be change to an oral suspension, as she just had "stomach surgery" and can not tolerate pills. I contacted the pharmacy and verified she had not picked up Cleocin capsules. Please send in suspension form. Will forward to ordering provider and pcp at this point.

## 2020-02-14 ENCOUNTER — Other Ambulatory Visit: Payer: Self-pay | Admitting: Family Medicine

## 2020-02-14 MED ORDER — CLINDAMYCIN PALMITATE HCL 75 MG/5ML PO SOLR: 300.0000 mg | Freq: Two times a day (BID) | ORAL | 0 refills | Status: AC

## 2020-02-14 NOTE — Telephone Encounter (Signed)
Please call and let her know that a new rx was sent to her pharmacy.  Thank you, Matilde Haymaker, MD

## 2020-02-14 NOTE — Telephone Encounter (Signed)
Patient contacted and informed of change and new medication to pharmacy.

## 2020-02-29 ENCOUNTER — Ambulatory Visit: Payer: Self-pay

## 2020-02-29 ENCOUNTER — Other Ambulatory Visit: Payer: Self-pay

## 2020-02-29 NOTE — Chronic Care Management (AMB) (Signed)
  Care Management   Outreach Note  02/29/2020 Name: Virginia Cardenas MRN: [id number] DOB: 07-18-1986  Referred by: Patriciaann Clan, DO Reason for referral : Care Coordination (Care Management [place])   [facility] outreach the patient and she stated that she was unable to speak with me at the time and asked if I could call her back later this afternoon.  Follow Up Plan: The care management team will reach out to the patient again over the next 1-3 days.  The patient has been provided with contact information for the care management team and has been advised to call with any health related questions or concerns.   Lazaro Arms RN, BSN, [facility] Care Management Coordinator [place] Phone: [phone] Fax: [phone]

## 2020-03-12 ENCOUNTER — Encounter: Payer: Self-pay | Admitting: Family Medicine

## 2020-03-12 ENCOUNTER — Ambulatory Visit: Payer: Self-pay

## 2020-03-12 ENCOUNTER — Ambulatory Visit: Payer: Self-pay | Admitting: Family Medicine

## 2020-03-12 ENCOUNTER — Ambulatory Visit (INDEPENDENT_AMBULATORY_CARE_PROVIDER_SITE_OTHER): Payer: Self-pay | Admitting: Family Medicine

## 2020-03-12 ENCOUNTER — Other Ambulatory Visit: Payer: Self-pay

## 2020-03-12 VITALS — BP 104/72 | HR 68 | Wt 230.0 lb

## 2020-03-12 DIAGNOSIS — Z111 Encounter for screening for respiratory tuberculosis: Secondary | ICD-10-CM

## 2020-03-12 DIAGNOSIS — E236 Other disorders of pituitary gland: Secondary | ICD-10-CM

## 2020-03-12 DIAGNOSIS — Z0000 Encounter for general adult medical examination without abnormal findings: Secondary | ICD-10-CM

## 2020-03-12 DIAGNOSIS — Z720 Tobacco use: Secondary | ICD-10-CM

## 2020-03-12 MED ORDER — CLINDAMYCIN HCL 300 MG PO CAPS: 300.0000 mg | ORAL_CAPSULE | Freq: Two times a day (BID) | ORAL | 0 refills | Status: DC

## 2020-03-12 NOTE — Assessment & Plan Note (Signed)
Congratulated patient on quitting. Encouraged continued cessation

## 2020-03-12 NOTE — Assessment & Plan Note (Signed)
TB skin test administered. Advised to f/u in 48hrs in RN clinic to have test read

## 2020-03-12 NOTE — Assessment & Plan Note (Signed)
Doing well post surgery. Vision is improved and asymptomatic otherwise. Neuro exam wnl. Given patient has been released by neurosurgery she can return to work. Work note written. F/u with PCP

## 2020-03-12 NOTE — Progress Notes (Signed)
    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 Patient would like to go to work without restrictions. No dizziness or LH. States she feels "perfectly back to normal". Reports her vision is improved. Patient saw neurosurgery on 5/24 and was cleared from their end. They informed her PCP would need to write note for work clearance.   Desire for TB test Needs TB test for work. No exposure to anyone with TB.   Tobacco use Has quit smoking since her surgery in March.   PERTINENT  PMH / PSH: pituitary macroadenoma, obesity  OBJECTIVE:   BP 104/72   Pulse 68   Wt 230 lb (104.3 kg)   SpO2 98%   BMI 37.12 kg/m   Gen: awake and alert, NAD HEENT: PERRL Cardio: RRR, no MRG Resp: CTAB, no wheezes, rales or rhonchi GI: soft, non tender, non distended, bowel sounds present Ext: no edema Neuro: CN2-12 intact, no focal deficits, sensation intact   ASSESSMENT/PLAN:   Pituitary mass (HCC) Doing well post surgery. Vision is improved and asymptomatic otherwise. Neuro exam wnl. Given patient has been released by neurosurgery she can return to work. Work note written. F/u with PCP   Healthcare maintenance TB skin test administered. Advised to f/u in 48hrs in RN clinic to have test read  Tobacco abuse Congratulated patient on quitting. Encouraged continued cessation      Caroline More, [place]

## 2020-03-12 NOTE — Patient Instructions (Signed)
1. Please return in 48hrs to have your TB test read 2. Follow up if no improvement of your BV

## 2020-03-14 ENCOUNTER — Other Ambulatory Visit: Payer: Self-pay

## 2020-03-14 ENCOUNTER — Ambulatory Visit (INDEPENDENT_AMBULATORY_CARE_PROVIDER_SITE_OTHER): Payer: Self-pay

## 2020-03-14 DIAGNOSIS — Z111 Encounter for screening for respiratory tuberculosis: Secondary | ICD-10-CM

## 2020-03-14 LAB — TB SKIN TEST
Induration: 0 mm
TB Skin Test: NEGATIVE

## 2020-03-14 NOTE — Progress Notes (Signed)
Patient is here for a PPD read.  It was placed on 03/12/2020 in the right forearm @ 1108 am.    PPD RESULTS:  Result: negative Induration: 0 mm  Letter created and given to patient for documentation purposes. Talbot Grumbling, RN

## 2020-04-04 ENCOUNTER — Other Ambulatory Visit (HOSPITAL_COMMUNITY): Payer: Self-pay | Admitting: Neurosurgery

## 2020-04-04 ENCOUNTER — Other Ambulatory Visit: Payer: Self-pay | Admitting: Neurosurgery

## 2020-04-04 DIAGNOSIS — D352 Benign neoplasm of pituitary gland: Secondary | ICD-10-CM

## 2020-04-30 ENCOUNTER — Ambulatory Visit (HOSPITAL_COMMUNITY): Admission: RE | Admit: 2020-04-30 | Payer: Self-pay | Source: Ambulatory Visit

## 2020-04-30 ENCOUNTER — Encounter (HOSPITAL_COMMUNITY): Payer: Self-pay

## 2020-07-02 ENCOUNTER — Other Ambulatory Visit: Payer: Self-pay | Admitting: Internal Medicine

## 2020-07-02 DIAGNOSIS — L83 Acanthosis nigricans: Secondary | ICD-10-CM | POA: Diagnosis not present

## 2020-07-02 DIAGNOSIS — Z87891 Personal history of nicotine dependence: Secondary | ICD-10-CM | POA: Diagnosis not present

## 2020-07-02 DIAGNOSIS — R946 Abnormal results of thyroid function studies: Secondary | ICD-10-CM

## 2020-07-02 DIAGNOSIS — N911 Secondary amenorrhea: Secondary | ICD-10-CM | POA: Diagnosis not present

## 2020-07-02 DIAGNOSIS — D352 Benign neoplasm of pituitary gland: Secondary | ICD-10-CM | POA: Diagnosis not present

## 2020-07-02 DIAGNOSIS — R5383 Other fatigue: Secondary | ICD-10-CM | POA: Diagnosis not present

## 2020-07-02 DIAGNOSIS — R635 Abnormal weight gain: Secondary | ICD-10-CM | POA: Diagnosis not present

## 2020-07-04 DIAGNOSIS — L83 Acanthosis nigricans: Secondary | ICD-10-CM | POA: Diagnosis not present

## 2020-07-04 DIAGNOSIS — R635 Abnormal weight gain: Secondary | ICD-10-CM | POA: Diagnosis not present

## 2020-07-04 DIAGNOSIS — D352 Benign neoplasm of pituitary gland: Secondary | ICD-10-CM | POA: Diagnosis not present

## 2020-07-04 DIAGNOSIS — Z131 Encounter for screening for diabetes mellitus: Secondary | ICD-10-CM | POA: Diagnosis not present

## 2020-07-04 DIAGNOSIS — N911 Secondary amenorrhea: Secondary | ICD-10-CM | POA: Diagnosis not present

## 2020-07-04 DIAGNOSIS — R5383 Other fatigue: Secondary | ICD-10-CM | POA: Diagnosis not present

## 2020-07-04 DIAGNOSIS — R946 Abnormal results of thyroid function studies: Secondary | ICD-10-CM | POA: Diagnosis not present

## 2020-07-09 ENCOUNTER — Other Ambulatory Visit: Payer: Self-pay

## 2020-07-16 DIAGNOSIS — E230 Hypopituitarism: Secondary | ICD-10-CM | POA: Diagnosis not present

## 2020-07-16 DIAGNOSIS — E221 Hyperprolactinemia: Secondary | ICD-10-CM | POA: Diagnosis not present

## 2020-07-16 DIAGNOSIS — D352 Benign neoplasm of pituitary gland: Secondary | ICD-10-CM | POA: Diagnosis not present

## 2020-07-16 DIAGNOSIS — R7303 Prediabetes: Secondary | ICD-10-CM | POA: Diagnosis not present

## 2020-07-16 DIAGNOSIS — N911 Secondary amenorrhea: Secondary | ICD-10-CM | POA: Diagnosis not present

## 2020-07-16 DIAGNOSIS — L83 Acanthosis nigricans: Secondary | ICD-10-CM | POA: Diagnosis not present

## 2020-07-16 DIAGNOSIS — R946 Abnormal results of thyroid function studies: Secondary | ICD-10-CM | POA: Diagnosis not present

## 2020-07-18 DIAGNOSIS — E230 Hypopituitarism: Secondary | ICD-10-CM | POA: Diagnosis not present

## 2020-07-18 DIAGNOSIS — N911 Secondary amenorrhea: Secondary | ICD-10-CM | POA: Diagnosis not present

## 2020-07-18 DIAGNOSIS — D352 Benign neoplasm of pituitary gland: Secondary | ICD-10-CM | POA: Diagnosis not present

## 2020-07-18 DIAGNOSIS — E221 Hyperprolactinemia: Secondary | ICD-10-CM | POA: Diagnosis not present

## 2021-01-13 DIAGNOSIS — D352 Benign neoplasm of pituitary gland: Secondary | ICD-10-CM | POA: Diagnosis not present

## 2021-01-13 DIAGNOSIS — R946 Abnormal results of thyroid function studies: Secondary | ICD-10-CM | POA: Diagnosis not present

## 2021-01-13 DIAGNOSIS — R7303 Prediabetes: Secondary | ICD-10-CM | POA: Diagnosis not present

## 2021-01-13 DIAGNOSIS — L83 Acanthosis nigricans: Secondary | ICD-10-CM | POA: Diagnosis not present

## 2021-01-13 DIAGNOSIS — N911 Secondary amenorrhea: Secondary | ICD-10-CM | POA: Diagnosis not present

## 2021-01-13 DIAGNOSIS — E221 Hyperprolactinemia: Secondary | ICD-10-CM | POA: Diagnosis not present

## 2021-01-13 DIAGNOSIS — E230 Hypopituitarism: Secondary | ICD-10-CM | POA: Diagnosis not present

## 2021-01-29 ENCOUNTER — Other Ambulatory Visit: Payer: Medicaid Other

## 2021-03-06 ENCOUNTER — Encounter: Payer: Self-pay | Admitting: Family Medicine

## 2021-03-06 ENCOUNTER — Other Ambulatory Visit: Payer: Self-pay

## 2021-03-06 ENCOUNTER — Ambulatory Visit (INDEPENDENT_AMBULATORY_CARE_PROVIDER_SITE_OTHER): Payer: Medicaid Other | Admitting: Family Medicine

## 2021-03-06 VITALS — BP 110/60 | HR 84 | Ht 66.0 in | Wt 264.0 lb

## 2021-03-06 DIAGNOSIS — Z124 Encounter for screening for malignant neoplasm of cervix: Secondary | ICD-10-CM

## 2021-03-06 DIAGNOSIS — E669 Obesity, unspecified: Secondary | ICD-10-CM

## 2021-03-06 DIAGNOSIS — E893 Postprocedural hypopituitarism: Secondary | ICD-10-CM | POA: Diagnosis not present

## 2021-03-06 DIAGNOSIS — Z020 Encounter for examination for admission to educational institution: Secondary | ICD-10-CM

## 2021-03-06 DIAGNOSIS — Z113 Encounter for screening for infections with a predominantly sexual mode of transmission: Secondary | ICD-10-CM | POA: Diagnosis not present

## 2021-03-06 DIAGNOSIS — Z1159 Encounter for screening for other viral diseases: Secondary | ICD-10-CM

## 2021-03-06 DIAGNOSIS — Z23 Encounter for immunization: Secondary | ICD-10-CM | POA: Diagnosis not present

## 2021-03-06 NOTE — Assessment & Plan Note (Signed)
Patient is going to come back for this. Plans for STD screening at that time as well.

## 2021-03-06 NOTE — Addendum Note (Signed)
Addended by: Christen Bame D on: 03/06/2021 04:35 PM   Modules accepted: Orders

## 2021-03-06 NOTE — Assessment & Plan Note (Signed)
Patient to make follow-up appointment for Pap screening as well as gonorrhea chlamydia.

## 2021-03-06 NOTE — Patient Instructions (Addendum)
Please go to the [facility] for the tuberculosis management.   Schedule an appointment  Tb Nurse at [facility]  We will follow up with your labs  Come back for your pap smear

## 2021-03-06 NOTE — Progress Notes (Signed)
SUBJECTIVE:   Chief compliant/HPI: annual examination  Virginia Cardenas is a 35 y.o. who presents today for an annual exam.   Review of systems form notable - negative.   Updated history tabs and problem list.   OBJECTIVE:   BP 110/60   Pulse 84   Ht 5' 6" (1.676 m)   Wt 264 lb (119.7 kg)   LMP 02/04/2021   SpO2 97%   BMI 42.61 kg/m   General: NAD, non-toxic, well-appearing, sitting comfortably on exam table.   HEENT: [place]/AT. PERRLA. EOMI.  Cardiovascular: RRR, normal S1, S2. B/L 2+ RP. No BLEE Respiratory: CTAB. No IWOB.  Abdomen: + BS. NT, ND, soft to palpation.  Extremities: Warm and well perfused. Moving spontaneously.  Integumentary: No obvious rashes, lesions, trauma on general exam. Neuro: A & O x4. CN grossly intact. No FND  ASSESSMENT/PLAN:   Obesity Patient is going to come back for this. Plans for STD screening at that time as well.   Cervical cancer screening Patient to make follow-up appointment for Pap screening as well as gonorrhea chlamydia.    Annual Examination  See AVS for age appropriate recommendations.   PHQ score 0, reviewed and discussed. Blood pressure reviewed and at goal.  Asked about intimate partner violence and patient reports none.  The patient currently uses nothing for contraception. Does not plan on having any further children. Discussed contraception options. Patient reports that she is not sexually active at this time. She should return if she becomes sexually active.  Folate recommended as appropriate, minimum of 400 mcg per day.  Advanced directives- does not have   Considered the following items based upon USPSTF recommendations: HIV testing: ordered Hepatitis C: ordered Hepatitis B: ordered Syphilis if at high risk: ordered GC/CT deferring for pap smear at next visit. Obtained HIV, RPR with labs today. Lipid panel (nonfasting or fasting) discussed based upon AHA recommendations and not ordered.  Consider repeat every  4-6 years.  Reviewed risk factors for latent tuberculosis and requested - Positve quant golds in the past. Normal PPD.   Discussed family history, BRCA testing not indicated.  Cervical cancer screening: rescheduling appt. Immunizations Tdap today.  Follow up for Pap smear, GC/C. Then 1 year or sooner if indicated.  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  Orders Placed This Encounter  Procedures  . Varicella zoster antibody, IgG  . Measles/Mumps/Rubella Immunity  . Hepatitis B surface antibody,qualitative  . HIV antibody (with reflex)  . RPR  . Hepatitis C antibody  . Lipid Panel    Wilber Oliphant, MD [place]

## 2021-03-07 LAB — LIPID PANEL
Chol/HDL Ratio: 7.3 ratio — ABNORMAL HIGH (ref 0.0–4.4)
Cholesterol, Total: 212 mg/dL — ABNORMAL HIGH (ref 100–199)
HDL: 29 mg/dL — ABNORMAL LOW (ref >39)
LDL Chol Calc (NIH): 161 mg/dL — ABNORMAL HIGH (ref 0–99)
Triglycerides: 118 mg/dL (ref 0–149)
VLDL Cholesterol Cal: 22 mg/dL (ref 5–40)

## 2021-03-07 LAB — HEPATITIS C ANTIBODY: Hep C Virus Ab: 0.1 s/co ratio (ref 0.0–0.9)

## 2021-03-07 LAB — MEASLES/MUMPS/RUBELLA IMMUNITY
MUMPS ABS, IGG: <9 AU/mL — ABNORMAL LOW (ref >10.9)
RUBEOLA AB, IGG: 27.7 AU/mL (ref >16.4)
Rubella Antibodies, IGG: 3.8 index (ref >0.99)

## 2021-03-07 LAB — VARICELLA ZOSTER ANTIBODY, IGG: Varicella zoster IgG: <135 index — ABNORMAL LOW (ref >165)

## 2021-03-07 LAB — RPR: RPR Ser Ql: NONREACTIVE

## 2021-03-07 LAB — HEPATITIS B SURFACE ANTIBODY,QUALITATIVE: Hep B Surface Ab, Qual: NONREACTIVE

## 2021-03-07 LAB — HIV ANTIBODY (ROUTINE TESTING W REFLEX): HIV Screen 4th Generation wRfx: NONREACTIVE

## 2021-03-10 ENCOUNTER — Telehealth: Payer: Self-pay

## 2021-03-10 NOTE — Telephone Encounter (Signed)
Called pt on the [phone] number two times. Phone rang twice and then switched to a fast busy signal both times I called the number. Sending pt a message through mychart to call the office and schedule an appt for Vaccines either in nurse clinic or at next appt with Dr. Maudie Mercury on 03/24/2021. Ottis Stain, CMA

## 2021-03-10 NOTE — Telephone Encounter (Signed)
-----  Message from Wilber Oliphant, MD sent at 03/09/2021  4:58 PM EDT ----- Patient needs to return for MMR and varicella series. Please call patient and schedule in nurse clinic.

## 2021-03-11 ENCOUNTER — Ambulatory Visit: Payer: Medicaid Other

## 2021-03-24 ENCOUNTER — Ambulatory Visit: Payer: Medicaid Other | Admitting: Family Medicine

## 2021-04-15 ENCOUNTER — Ambulatory Visit: Payer: Medicaid Other | Admitting: Family Medicine

## 2021-04-15 NOTE — Progress Notes (Deleted)
    SUBJECTIVE:   CHIEF COMPLAINT / HPI:   pap  PERTINENT  PMH / PSH: ***  OBJECTIVE:   There were no vitals taken for this visit.  ***  ASSESSMENT/PLAN:   No problem-specific Assessment & Plan notes found for this encounter.     Benay Pike, MD [place]   {    This will disappear when note is signed, click to select method of visit    :1}

## 2021-04-16 ENCOUNTER — Ambulatory Visit: Payer: Medicaid Other | Admitting: Family Medicine

## 2021-04-30 ENCOUNTER — Ambulatory Visit: Payer: Medicaid Other | Admitting: Family Medicine

## 2021-04-30 NOTE — Progress Notes (Deleted)
    SUBJECTIVE:   CHIEF COMPLAINT / HPI:   35 yo woman presents today for pap smear. Last pap in 2019 with cytology only at age 37, NILM.  H/o LGSIL in 2013. HPV vaccines? Will repeat with cotesting today. Patient denies*** symptoms. She would like routine STI testing today***. H/o CT infection 2013. She uses *** for birth control.  Anemia?  Immunity: patient had titers for varicella zoster, measles, mumps, and rubella checked at last visit in May. She is immune to measles and rubella, but appears to have lost immunity to mumps and varicella.  PERTINENT  PMH / PSH: ***  OBJECTIVE:   There were no vitals taken for this visit.  ***  ASSESSMENT/PLAN:   No problem-specific Assessment & Plan notes found for this encounter.     Gladys Damme, MD [place]

## 2021-07-15 ENCOUNTER — Other Ambulatory Visit: Payer: Self-pay

## 2021-07-15 ENCOUNTER — Encounter (HOSPITAL_COMMUNITY): Payer: Self-pay | Admitting: Emergency Medicine

## 2021-07-15 ENCOUNTER — Ambulatory Visit (HOSPITAL_COMMUNITY)
Admission: EM | Admit: 2021-07-15 | Discharge: 2021-07-15 | Disposition: A | Discharge status: Home / Self Care | Payer: Medicaid Other | Attending: Family Medicine | Admitting: Family Medicine

## 2021-07-15 DIAGNOSIS — S39012A Strain of muscle, fascia and tendon of lower back, initial encounter: Secondary | ICD-10-CM | POA: Diagnosis not present

## 2021-07-15 LAB — POCT URINALYSIS DIPSTICK, ED / UC
Bilirubin Urine: NEGATIVE
Glucose, UA: NEGATIVE mg/dL
Ketones, ur: NEGATIVE mg/dL
Leukocytes,Ua: NEGATIVE
Nitrite: NEGATIVE
Protein, ur: NEGATIVE mg/dL
Specific Gravity, Urine: 1.025 (ref 1.005–1.030)
Urobilinogen, UA: 0.2 mg/dL (ref 0.0–1.0)
pH: 6 (ref 5.0–8.0)

## 2021-07-15 LAB — POC URINE PREG, ED: Preg Test, Ur: NEGATIVE

## 2021-07-15 MED ORDER — NAPROXEN 500 MG PO TABS: 500.0000 mg | ORAL_TABLET | Freq: Two times a day (BID) | ORAL | 0 refills | Status: DC

## 2021-07-15 MED ORDER — CYCLOBENZAPRINE HCL 10 MG PO TABS: 10.0000 mg | ORAL_TABLET | Freq: Three times a day (TID) | ORAL | 0 refills | Status: DC | PRN

## 2021-07-15 MED ORDER — DEXAMETHASONE SODIUM PHOSPHATE 10 MG/ML IJ SOLN
10.0000 mg | Freq: Once | INTRAMUSCULAR | Status: AC
  Administered 2021-07-15: 10 mg via INTRAMUSCULAR

## 2021-07-15 MED ORDER — DEXAMETHASONE SODIUM PHOSPHATE 10 MG/ML IJ SOLN
INTRAMUSCULAR | Status: AC
  Filled 2021-07-15: qty 1

## 2021-07-15 NOTE — ED Triage Notes (Signed)
Patient c/o lower back pain and dysuria x 3 days

## 2021-07-16 NOTE — ED Provider Notes (Signed)
[facility]   [id number] 07/15/21 Arrival Time: 9381  ASSESSMENT & PLAN:  1. Strain of lumbar region, initial encounter    UPT negative. U/A with trace hematuria. Suspect MSK etiology but possible kidney stone present. Will observe.  Meds ordered this encounter  Medications   dexamethasone (DECADRON) injection 10 mg   cyclobenzaprine (FLEXERIL) 10 MG tablet    Sig: Take 1 tablet (10 mg total) by mouth 3 (three) times daily as needed for muscle spasms. Do not drink alcohol or drive while taking this medication.  May cause drowsiness.    Dispense:  15 tablet    Refill:  0    Order Specific Question:   Supervising Provider    Answer:   Chase Picket [[id number]]   naproxen (NAPROSYN) 500 MG tablet    Sig: Take 1 tablet (500 mg total) by mouth 2 (two) times daily.    Dispense:  30 tablet    Refill:  0    Order Specific Question:   Supervising Provider    Answer:   Chase Picket [[id number]]     Follow-up Information     Simmons-Robinson, Riki Sheer, MD.   Specialty: Family Medicine Contact information: [address] [place] [address] [phone]                 Reviewed expectations re: course of current medical issues. Questions answered. Outlined signs and symptoms indicating need for more acute intervention. Understanding verbalized. After Visit Summary given.   SUBJECTIVE: History from: patient. Virginia Cardenas is a 35 y.o. female who reports lower back discomfort; vague; seems bilateral. No injury/trauma. Worse with certain movements. Questions mild dysuria at times. No hematuria. Afebrile. Denies: abdominal. Normal PO intake without n/v/d.   OBJECTIVE:  Vitals:   07/15/21 1628  BP: 118/79  Pulse: 94  Temp: 98.4 F (36.9 C)  TempSrc: Oral  SpO2: 100%    General appearance: alert; no distress Eyes: PERRLA; EOMI; conjunctiva normal HENT: [place]; AT; without nasal congestion Neck: supple  Lungs: speaks full sentences without  difficulty; unlabored Abd: benign Extremities: no edema Skin: warm and dry Neurologic: normal gait Psychological: alert and cooperative; normal mood and affect  Labs: Results for orders placed or performed during the hospital encounter of 07/15/21  POC Urinalysis dipstick  Result Value Ref Range   Glucose, UA NEGATIVE NEGATIVE mg/dL   Bilirubin Urine NEGATIVE NEGATIVE   Ketones, ur NEGATIVE NEGATIVE mg/dL   Specific Gravity, Urine 1.025 1.005 - 1.030   Hgb urine dipstick TRACE (A) NEGATIVE   pH 6.0 5.0 - 8.0   Protein, ur NEGATIVE NEGATIVE mg/dL   Urobilinogen, UA 0.2 0.0 - 1.0 mg/dL   Nitrite NEGATIVE NEGATIVE   Leukocytes,Ua NEGATIVE NEGATIVE  POC urine pregnancy  Result Value Ref Range   Preg Test, Ur NEGATIVE NEGATIVE   Labs Reviewed  POCT URINALYSIS DIPSTICK, ED / UC - Abnormal; Notable for the following components:      Result Value   Hgb urine dipstick TRACE (*)    All other components within normal limits  POC URINE PREG, ED    Allergies  Allergen Reactions   Latex Rash   Metronidazole Rash    See photo of rash in office visit note from 06/01/16    Past Medical History:  Diagnosis Date   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   Pituitary mass ([facility]) 11/28/2019   Pituitary tumor 01/11/2020   Social History   Socioeconomic History   Marital status: Single  Spouse name: Not on file   Number of children: Not on file   Years of education: Not on file   Highest education level: Not on file  Occupational History   Not on file  Tobacco Use   Smoking status: Former    Packs/day: 0.00    Years: 0.50    Pack years: 0.00    Types: Cigars, Cigarettes    Quit date: 12/2019    Years since quitting: 1.5   Smokeless tobacco: Never  Vaping Use   Vaping Use: Never used  Substance and Sexual Activity   Alcohol use: Yes    Comment: occ   Drug use: No   Sexual activity: Yes    Birth control/protection: None  Other Topics Concern   Not on file  Social History  Narrative   Not on file   Social Determinants of Health   Financial Resource Strain: Not on file  Food Insecurity: Not on file  Transportation Needs: Not on file  Physical Activity: Not on file  Stress: Not on file  Social Connections: Not on file  Intimate Partner Violence: Not on file   Family History  Problem Relation Age of Onset   Cancer Neg Hx    Diabetes Neg Hx    Heart failure Neg Hx    Hyperlipidemia Neg Hx    Past Surgical History:  Procedure Laterality Date   CESAREAN SECTION     CRANIOTOMY N/A 01/11/2020   Procedure: ENDOSCOPIC TRANSNASAL TRANSPEHENOIDAL RESECTION OF PITUITARY TUMOR;  Surgeon: Consuella Lose, MD;  Location: [facility];  Service: Neurosurgery;  Laterality: N/A;  ENDOSCOPIC TRANSNASAL TRANSPEHENOIDAL RESECTION OF PITUITARY TUMOR   SINUS ENDO WITH FUSION N/A 01/11/2020   Procedure: SINUS ENDO WITH FUSION;  Surgeon: [name], MD;  Location: [facility];  Service: ENT;  Laterality: N/A;  SINUS ENDO WITH Luetta Nutting, MD 07/16/21 1303

## 2021-08-19 ENCOUNTER — Ambulatory Visit: Payer: Medicaid Other | Admitting: Family Medicine

## 2021-11-19 ENCOUNTER — Ambulatory Visit: Payer: Medicaid Other | Admitting: Family Medicine

## 2021-12-08 ENCOUNTER — Ambulatory Visit: Payer: Medicaid Other | Admitting: Family Medicine

## 2021-12-18 ENCOUNTER — Ambulatory Visit: Payer: Medicaid Other | Admitting: Family Medicine

## 2022-02-15 ENCOUNTER — Ambulatory Visit (INDEPENDENT_AMBULATORY_CARE_PROVIDER_SITE_OTHER): Payer: Medicaid Other | Admitting: Family Medicine

## 2022-02-15 VITALS — BP 118/68 | HR 84 | Wt 272.0 lb

## 2022-02-15 DIAGNOSIS — Z86018 Personal history of other benign neoplasm: Secondary | ICD-10-CM | POA: Diagnosis not present

## 2022-02-15 DIAGNOSIS — H539 Unspecified visual disturbance: Secondary | ICD-10-CM | POA: Diagnosis not present

## 2022-02-15 NOTE — Progress Notes (Signed)
? ? ?  SUBJECTIVE:  ? ?CHIEF COMPLAINT / HPI:  ? ?Patient with a history of prior pituitary mass and now s/p transphenoidal resection presents with intermittent headaches which she typically does not get. She is also having vision changes where she experienced blurred vision that lasts a few seconds to a minute and then resolves. This seems to occur once every 2 weeks. Denies any weakness and appetite changes but endorses intermittent fatigue and unexpected weight gain. Denies nipple discharge. LMP 2021. These symptoms began to reemerge about 2 months ago, she thought it was because she was exhausted from work but wants to get it checked it because this is how she felt 2 years ago when she had the tumor. 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 She reports not seeing an endocrinologist or being told to take any supplementation.  ? ?OBJECTIVE:  ? ?BP 118/68   Pulse 84   Wt 272 lb (123.4 kg)   SpO2 99%   BMI 43.90 kg/m?   ?General: Patient well-appearing, in no acute distress. ?HEENT: PERRLA, non-tender thyroid, no evidence of cervical LAD ?CV: RRR, no murmurs or gallops auscultated  ?Resp: CTAB, no wheezing, rales or rhonchi noted ?GI: soft, nontender, nondistended, presence of bowel sounds ?Neuro: bilateral lateral hemianopsia otherwise CN 2-12 grossly intact, gross sensation intact, 5/5 UE and LE strength bilaterally, 2+ brachioradialis, patellar and Achilles reflex, normal gait  ?Psych: mood appropriate  ? ?ASSESSMENT/PLAN:  ? ?History of pituitary adenoma ?-given history, symptoms and clinical findings will obtained MRI to examine pituitary as I am concerned that her mass may have returned which may be affecting her optic chiasm as there was noted to be compression along this area on the initial MRI 2 years ago. Repeat MRI following tumor resection not noted  for comparison per chart review ?-awaiting TSH, T3, T4, prolactin, FSH and LH ?-given amenorrhea, consider initiation of contraception as patient will likely benefit from resetting her menstrual cycle ?-follow up in 3 weeks with Dr. Janus Molder or sooner as appropriate depending on blood work and imaging ?-patient in agreement with current plan in place, I explained that based on the results, I will let her know if we need to schedule a visit for sooner to discuss next course of action within treatment plan ? ? ?-PHQ-9 score of 4 with negative question 9 reviewed.  ? ?Donney Dice, DO ?[place]  ?

## 2022-02-15 NOTE — Assessment & Plan Note (Addendum)
-  given history, symptoms and clinical findings will obtained MRI to examine pituitary as I am concerned that her mass may have returned which may be affecting her optic chiasm as there was noted to be compression along this area on the initial MRI 2 years ago. Repeat MRI following tumor resection not noted for comparison per chart review ?-awaiting TSH, T3, T4, prolactin, FSH and LH ?-given amenorrhea, consider initiation of contraception as patient will likely benefit from resetting her menstrual cycle ?-follow up in 3 weeks with Dr. Janus Molder or sooner as appropriate depending on blood work and imaging ?-patient in agreement with current plan in place, I explained that based on the results, I will let her know if we need to schedule a visit for sooner to discuss next course of action within treatment plan ?

## 2022-02-15 NOTE — Patient Instructions (Signed)
It was great seeing you today! ? ?Today we discussed your symptoms, I am worried that your pituitary mass that you had previously may be coming back given your symptoms. I have placed an order for another MRI to better image the area. We will also get blood work, I will let you know of any abnormal results.  ? ?Please follow up at your next scheduled appointment in 3 weeks, if anything arises between now and then, please don't hesitate to contact our office. ? ? ?Thank you for allowing [place] to be a part of your medical care! ? ?Thank you, ?Dr. Larae Grooms  ?

## 2022-02-16 LAB — T4, FREE: Free T4: 1.03 ng/dL (ref 0.82–1.77)

## 2022-02-16 LAB — LUTEINIZING HORMONE: LH: 6 m[IU]/mL

## 2022-02-16 LAB — FOLLICLE STIMULATING HORMONE: FSH: 7.4 m[IU]/mL

## 2022-02-16 LAB — TSH: TSH: 1.06 u[IU]/mL (ref 0.450–4.500)

## 2022-02-16 LAB — T3, FREE: T3, Free: 2.5 pg/mL (ref 2.0–4.4)

## 2022-02-16 LAB — PROLACTIN: Prolactin: 17.1 ng/mL (ref 4.8–23.3)

## 2022-03-05 ENCOUNTER — Encounter: Payer: Medicaid Other | Admitting: Family Medicine

## 2022-03-09 ENCOUNTER — Encounter: Payer: Medicaid Other | Admitting: Family Medicine

## 2022-03-18 ENCOUNTER — Telehealth: Payer: Self-pay

## 2022-03-18 NOTE — Telephone Encounter (Signed)
Spoke with patient informed of MRI at [facility] on Sat. June 10th at 10:30. Patient understood. Salvatore Marvel, CMA

## 2022-03-23 ENCOUNTER — Encounter: Payer: Self-pay | Admitting: *Deleted

## 2022-03-27 ENCOUNTER — Ambulatory Visit (HOSPITAL_COMMUNITY)
Admission: RE | Admit: 2022-03-27 | Discharge: 2022-03-27 | Disposition: A | Discharge status: Home / Self Care | Payer: Medicaid Other | Source: Ambulatory Visit | Attending: Family Medicine | Admitting: Family Medicine

## 2022-03-27 DIAGNOSIS — R42 Dizziness and giddiness: Secondary | ICD-10-CM | POA: Diagnosis not present

## 2022-03-27 DIAGNOSIS — H539 Unspecified visual disturbance: Secondary | ICD-10-CM | POA: Insufficient documentation

## 2022-03-27 MED ORDER — GADOBUTROL 1 MMOL/ML IV SOLN
10.0000 mL | Freq: Once | INTRAVENOUS | Status: AC | PRN
Start: 2022-03-27 — End: 2022-03-27
  Administered 2022-03-27: 10 mL via INTRAVENOUS

## 2022-05-05 ENCOUNTER — Ambulatory Visit (HOSPITAL_COMMUNITY)
Admission: EM | Admit: 2022-05-05 | Discharge: 2022-05-05 | Disposition: A | Discharge status: Home / Self Care | Payer: Medicaid Other | Attending: Emergency Medicine | Admitting: Emergency Medicine

## 2022-05-05 ENCOUNTER — Encounter (HOSPITAL_COMMUNITY): Payer: Self-pay | Admitting: Emergency Medicine

## 2022-05-05 DIAGNOSIS — N390 Urinary tract infection, site not specified: Secondary | ICD-10-CM | POA: Insufficient documentation

## 2022-05-05 DIAGNOSIS — L0293 Carbuncle, unspecified: Secondary | ICD-10-CM | POA: Diagnosis not present

## 2022-05-05 DIAGNOSIS — B86 Scabies: Secondary | ICD-10-CM | POA: Insufficient documentation

## 2022-05-05 DIAGNOSIS — R319 Hematuria, unspecified: Secondary | ICD-10-CM | POA: Diagnosis not present

## 2022-05-05 LAB — POCT URINALYSIS DIPSTICK, ED / UC
Bilirubin Urine: NEGATIVE
Glucose, UA: NEGATIVE mg/dL
Ketones, ur: NEGATIVE mg/dL
Nitrite: NEGATIVE
Protein, ur: NEGATIVE mg/dL
Specific Gravity, Urine: 1.025 (ref 1.005–1.030)
Urobilinogen, UA: 0.2 mg/dL (ref 0.0–1.0)
pH: 5.5 (ref 5.0–8.0)

## 2022-05-05 LAB — POC URINE PREG, ED: Preg Test, Ur: NEGATIVE

## 2022-05-05 MED ORDER — SULFAMETHOXAZOLE-TRIMETHOPRIM 800-160 MG PO TABS: 1.0000 | ORAL_TABLET | Freq: Two times a day (BID) | ORAL | 0 refills | Status: AC

## 2022-05-05 MED ORDER — PERMETHRIN 5 % EX CREA: TOPICAL_CREAM | CUTANEOUS | 0 refills | Status: DC

## 2022-05-05 NOTE — ED Triage Notes (Signed)
C/o dysuria for 3 days.  Reports rash and itching spreading up both arms for 4-5 days. Believes scabies.   Pt c/o boils that are reoccurring in back, axilla area, stomach area. Reports will pop herself.

## 2022-05-05 NOTE — ED Provider Notes (Signed)
[place]    CSN: [id number] Arrival date & time: 05/05/22  1131      History   Chief Complaint Chief Complaint  Patient presents with   Dysuria   Rash    HPI Virginia Cardenas is a 36 y.o. female.  Presents with 3 day history of bladder discomfort with urination.  Reports urge to urinate but only makes a little urine each time.  This morning developed some lower abdominal cramping.  Denies any blood in the urine.  No vaginal bleeding or spotting.  Denies nausea, vomiting, diarrhea.  Reports has not been drinking much water.  Additionally 4-day history of rash spreading up forearms that she believes is scabies.  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  Rash is itchy.  Has not tried any topical medicine.  History of recurrent boils that appear on her back, axilla, stomach.  Reports that she pops them herself.  Denies any boils today.  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   Deviated septum 01/07/2020   Pituitary tumor 01/07/2020   Cervical cancer screening 03/12/2018   Obesity 03/12/2018    Past Surgical History:  Procedure Laterality Date   CESAREAN SECTION     CRANIOTOMY N/A 01/11/2020   Procedure: ENDOSCOPIC TRANSNASAL TRANSPEHENOIDAL RESECTION OF PITUITARY TUMOR;  Surgeon: Consuella Lose, MD;  Location: [facility];  Service: Neurosurgery;  Laterality: N/A;  ENDOSCOPIC TRANSNASAL TRANSPEHENOIDAL RESECTION OF PITUITARY TUMOR   SINUS ENDO WITH FUSION N/A 01/11/2020   Procedure: SINUS ENDO WITH FUSION;  Surgeon: [name], MD;  Location: [facility];  Service: ENT;  Laterality: N/A;  SINUS ENDO WITH FUSION    OB History   No obstetric history on file.      Home  Medications    Prior to Admission medications   Medication Sig Start Date End Date Taking? Authorizing Provider  permethrin (ELIMITE) 5 % cream Apply to affected area once, then once again 8 days later 05/05/22  Yes Jamariah Tony, Wells Guiles, PA-C  sulfamethoxazole-trimethoprim (BACTRIM DS) 800-160 MG tablet Take 1 tablet by mouth 2 (two) times daily for 7 days. 05/05/22 05/12/22 Yes Franklin Clapsaddle, Wells Guiles, PA-C    Family History Family History  Problem Relation Age of Onset   Cancer Neg Hx    Diabetes Neg Hx    Heart failure Neg Hx    Hyperlipidemia Neg Hx     Social History Social History   Tobacco Use   Smoking status: Every Day    Packs/day: 0.00    Years: 0.50    Total pack years: 0.00    Types: Cigars, Cigarettes    Last attempt to quit: 12/2019    Years since quitting: 2.3   Smokeless tobacco: Never  Vaping Use   Vaping Use: Never used  Substance Use Topics   Alcohol use: Yes    Comment: occ   Drug use: No     Allergies   Latex and Metronidazole   Review of Systems Review of Systems  Genitourinary:  Positive for dysuria.  Skin:  Positive for rash.   HPI  Physical Exam Triage Vital Signs ED Triage Vitals  Enc Vitals Group     BP 05/05/22 1146 111/71     Pulse  Rate 05/05/22 1146 87     Resp 05/05/22 1146 17     Temp 05/05/22 1146 98.1 F (36.7 C)     Temp Source 05/05/22 1146 Oral     SpO2 05/05/22 1146 98 %     Weight --      Height --      Head Circumference --      Peak Flow --      Pain Score 05/05/22 1143 9     Pain Loc --      Pain Edu? --      Excl. in [place]? --    No data found.  Updated Vital Signs BP 111/71 (BP Location: Right Arm)   Pulse 87   Temp 98.1 F (36.7 C) (Oral)   Resp 17   SpO2 98%   Visual Acuity Right Eye Distance:   Left Eye Distance:   Bilateral Distance:    Right Eye Near:   Left Eye Near:    Bilateral Near:     Physical Exam Vitals and nursing note reviewed.  Constitutional:      Appearance: Normal appearance.   HENT:     Mouth/Throat:     Mouth: Mucous membranes are moist.     Pharynx: Oropharynx is clear.  Eyes:     Conjunctiva/sclera: Conjunctivae normal.  Cardiovascular:     Rate and Rhythm: Normal rate and regular rhythm.     Heart sounds: Normal heart sounds.  Pulmonary:     Effort: Pulmonary effort is normal. No respiratory distress.     Breath sounds: Normal breath sounds.  Abdominal:     General: Bowel sounds are normal.     Palpations: Abdomen is soft.     Tenderness: There is no abdominal tenderness. There is no right CVA tenderness, left CVA tenderness, guarding or rebound.  Musculoskeletal:        General: Normal range of motion.  Skin:    General: Skin is warm and dry.     Findings: Rash present.     Comments: Maculopapular, somewhat vesicular rash up the bilateral forearms.  No lesions to the fingers or web spaces.  Patient believes it is scabies  Neurological:     Mental Status: She is alert and oriented to person, place, and time.      UC Treatments / Results  Labs (all labs ordered are listed, but only abnormal results are displayed) Labs Reviewed  POCT URINALYSIS DIPSTICK, ED / UC - Abnormal; Notable for the following components:      Result Value   Hgb urine dipstick TRACE (*)    Leukocytes,Ua SMALL (*)    All other components within normal limits  URINE CULTURE  POC URINE PREG, ED    EKG  Radiology No results found.  Procedures Procedures (including critical care time)  Medications Ordered in UC Medications - No data to display  Initial Impression / Assessment and Plan / UC Course  I have reviewed the triage vital signs and the nursing notes.  Pertinent labs & imaging results that were available during my care of the patient were reviewed by me and considered in my medical decision making (see chart for details).  Preg neg. Urinalysis trace hemoglobin, small leuks.  Culture pending at this time.  I believe symptoms may be due to decreased water  intake however patient believes she has a urinary tract infection.  We will treat with Bactrim twice daily for 7 days.  Additionally requesting treatment for possible scabies.  Permethrin cream with 2 applications 8 days apart. Understands this is contagious.  I recommend she see primary care and/or dermatology for her recurrent boils.  We discussed that there is not much I can do for her today as she does not have any abscess present.  She can return if she develops a boil that requires treatment. Return precautions discussed. Patient agrees to plan and is discharged in stable condition.  Final Clinical Impressions(s) / UC Diagnoses   Final diagnoses:  Urinary tract infection with hematuria, site unspecified  Scabies  Recurrent boils     Discharge Instructions      Take the antibiotic twice daily for 7 days.  Please increase your water intake to at least 64 ounces of water daily.  This will help with your urinary symptoms.  Use the permethrin cream topically once. Then apply topically again in 8 days. This will treat the possible scabies on your arms.  I recommend follow up with primary care and/or dermatology regarding your recurrent boils.  Please go to the emergency department if any symptoms worsen.     ED Prescriptions     Medication Sig Dispense Auth. Provider   sulfamethoxazole-trimethoprim (BACTRIM DS) 800-160 MG tablet Take 1 tablet by mouth 2 (two) times daily for 7 days. 14 tablet Serah Nicoletti, PA-C   permethrin (ELIMITE) 5 % cream Apply to affected area once, then once again 8 days later 60 g Zachary Lovins, Wells Guiles, PA-C      PDMP not reviewed this encounter.   Elvyn Krohn, Wells Guiles, [place] 05/05/22 1231

## 2022-05-05 NOTE — Discharge Instructions (Addendum)
Take the antibiotic twice daily for 7 days.  Please increase your water intake to at least 64 ounces of water daily.  This will help with your urinary symptoms.  Use the permethrin cream topically once. Then apply topically again in 8 days. This will treat the possible scabies on your arms.  I recommend follow up with primary care and/or dermatology regarding your recurrent boils.  Please go to the emergency department if any symptoms worsen.

## 2022-05-06 LAB — URINE CULTURE

## 2022-05-21 ENCOUNTER — Encounter (HOSPITAL_COMMUNITY): Payer: Self-pay

## 2022-05-21 ENCOUNTER — Ambulatory Visit (HOSPITAL_COMMUNITY)
Admission: EM | Admit: 2022-05-21 | Discharge: 2022-05-21 | Disposition: A | Discharge status: Home / Self Care | Payer: Medicaid Other | Attending: Internal Medicine | Admitting: Internal Medicine

## 2022-05-21 ENCOUNTER — Encounter: Payer: Medicaid Other | Admitting: Family Medicine

## 2022-05-21 DIAGNOSIS — S39012A Strain of muscle, fascia and tendon of lower back, initial encounter: Secondary | ICD-10-CM | POA: Diagnosis not present

## 2022-05-21 MED ORDER — DEXAMETHASONE SODIUM PHOSPHATE 10 MG/ML IJ SOLN
10.0000 mg | Freq: Once | INTRAMUSCULAR | Status: AC
  Administered 2022-05-21: 10 mg via INTRAMUSCULAR

## 2022-05-21 MED ORDER — NAPROXEN 500 MG PO TABS: 500.0000 mg | ORAL_TABLET | Freq: Two times a day (BID) | ORAL | 0 refills | Status: DC

## 2022-05-21 MED ORDER — CYCLOBENZAPRINE HCL 10 MG PO TABS: 10.0000 mg | ORAL_TABLET | Freq: Two times a day (BID) | ORAL | 0 refills | Status: DC | PRN

## 2022-05-21 MED ORDER — DEXAMETHASONE SODIUM PHOSPHATE 10 MG/ML IJ SOLN: 10.0000 mg | Freq: Once | INTRAMUSCULAR | Status: DC

## 2022-05-21 MED ORDER — DEXAMETHASONE SODIUM PHOSPHATE 10 MG/ML IJ SOLN
INTRAMUSCULAR | Status: AC
  Filled 2022-05-21: qty 1

## 2022-05-21 NOTE — ED Triage Notes (Signed)
Pt c/o lower back pain radiating down lt leg x1wk. Denies known injury. 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 Took tylenol with no relief.

## 2022-05-21 NOTE — Discharge Instructions (Addendum)
You have been evaluated in the today for your low back pain. Your pain is most likely muscle strain which will improve on its own with time. You may go back to work on Monday and your work note is at the back of the packet.  Take naproxen '500mg'$   with food every 12 hours for the next 2-3 days consistently to treat pain and inflammation, then as needed. Do not take any other NSAID containing medicine when taking naproxen like ibuprofen , Advil, Aleve, motrin, aspirin, Voltaren, diclofenac, and other medicines. Take this medication with a snack to avoid stomach upset.   Your first dose of naproxen when you get home may be at 8pm since you were given decadron steroid injection in the clinic today.  You may also take flexeril muscle relaxer every 12 hours.  Do not take this medication and drive or drink alcohol as it can make you sleepy.  Mainly use this medicine at nighttime as needed.  Apply heat and perform gentle range of motion exercises to the area of greatest pain to prevent muscle stiffness and provide further pain relief.   Follow-up with your primary care provider or return to urgent care if your symptoms do not improve in the next 3 to 4 days with medications and interventions recommended today.  If you develop any new or worsening symptoms, please return to urgent care.  If your symptoms are severe, please go to the emergency room.  I hope you feel better!

## 2022-05-21 NOTE — ED Provider Notes (Signed)
[place]    CSN: [id number] Arrival date & time: 05/21/22  1049      History   Chief Complaint Chief Complaint  Patient presents with   Back Pain    HPI Virginia Cardenas is a 36 y.o. female.   Patient presents to urgent care for evaluation of her low back pain that she has had for the last week. Pain is mostly to the lumbar spine and to the left side radiating to the left leg. 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 States she frequently has to help pick patients up off of the floor after falling, turn patients in the bed, and perform other physically demanding tasks at work. She has experienced these same symptoms in the past and was given "a shot" that helped a lot. She has had a difficult time sleeping comfortably and resting comfortably over the last couple of weeks due to the pain. Denies urinary symptoms, urinary/stool incontinence, numbness/tingling to the bilateral lower extremities, weakness, headache, blurry vision, dizziness, and fever/chills. No constipation, diarrhea, nausea, vomiting, or blood/mucous to the stools reported. No recent falls, injuries, or trauma to the area of greatest tenderness. She has been taking tylenol at home for her symptoms without much relief. She has not attempted use of any NSAID medications and denies diagnosis of diabetes mellitus. No other aggravating or relieving factors identified for patient's symptoms at this time.    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   Deviated septum 01/07/2020   Pituitary tumor 01/07/2020    Cervical cancer screening 03/12/2018   Obesity 03/12/2018    Past Surgical History:  Procedure Laterality Date   CESAREAN SECTION     CRANIOTOMY N/A 01/11/2020   Procedure: ENDOSCOPIC TRANSNASAL TRANSPEHENOIDAL RESECTION OF PITUITARY TUMOR;  Surgeon: Consuella Lose, MD;  Location: [facility];  Service: Neurosurgery;  Laterality: N/A;  ENDOSCOPIC TRANSNASAL TRANSPEHENOIDAL RESECTION OF PITUITARY TUMOR   SINUS ENDO WITH FUSION N/A 01/11/2020   Procedure: SINUS ENDO WITH FUSION;  Surgeon: [name], MD;  Location: [facility];  Service: ENT;  Laterality: N/A;  SINUS ENDO WITH FUSION    OB History   No obstetric history on file.      Home Medications    Prior to Admission medications   Medication Sig Start Date End Date Taking? Authorizing Provider  cyclobenzaprine (FLEXERIL) 10 MG tablet Take 1 tablet (10 mg total) by mouth 2 (two) times daily as needed for muscle spasms. 05/21/22  Yes Talbot Grumbling, FNP  naproxen (NAPROSYN) 500 MG tablet Take 1 tablet (500 mg total) by mouth 2 (two) times daily. 05/21/22  Yes Talbot Grumbling, FNP  permethrin (ELIMITE) 5 % cream Apply to affected area once, then once again 8 days later 05/05/22   Rising, Wells Guiles, PA-C    Family History Family History  Problem Relation Age of Onset   Cancer Neg Hx    Diabetes Neg Hx    Heart failure Neg Hx    Hyperlipidemia Neg Hx     Social History Social History   Tobacco Use  Smoking status: Every Day    Packs/day: 0.00    Years: 0.50    Total pack years: 0.00    Types: Cigars, Cigarettes    Last attempt to quit: 12/2019    Years since quitting: 2.4   Smokeless tobacco: Never  Vaping Use   Vaping Use: Never used  Substance Use Topics   Alcohol use: Yes    Comment: occ   Drug use: No     Allergies   Latex and Metronidazole   Review of Systems Review of Systems  Musculoskeletal:  Positive for back pain.  Per HPI   Physical Exam Triage Vital Signs ED Triage Vitals  Enc Vitals  Group     BP 05/21/22 1107 128/74     Pulse Rate 05/21/22 1107 75     Resp 05/21/22 1107 18     Temp 05/21/22 1107 97.6 F (36.4 C)     Temp Source 05/21/22 1107 Oral     SpO2 05/21/22 1107 100 %     Weight --      Height --      Head Circumference --      Peak Flow --      Pain Score 05/21/22 1108 10     Pain Loc --      Pain Edu? --      Excl. in [place]? --    No data found.  Updated Vital Signs BP 128/74 (BP Location: Left Arm)   Pulse 75   Temp 97.6 F (36.4 C) (Oral)   Resp 18   SpO2 100%   Visual Acuity Right Eye Distance:   Left Eye Distance:   Bilateral Distance:    Right Eye Near:   Left Eye Near:    Bilateral Near:     Physical Exam Vitals and nursing note reviewed.  Constitutional:      Appearance: Normal appearance. She is not ill-appearing or toxic-appearing.     Comments: Very pleasant patient sitting on exam in position of comfort table in no acute distress.   HENT:     Head: Normocephalic and atraumatic.     Right Ear: Hearing and external ear normal.     Left Ear: Hearing and external ear normal.     Nose: Nose normal.     Mouth/Throat:     Lips: Pink.     Mouth: Mucous membranes are moist.  Eyes:     General: Lids are normal. Vision grossly intact. Gaze aligned appropriately.     Extraocular Movements: Extraocular movements intact.     Conjunctiva/sclera: Conjunctivae normal.  Pulmonary:     Effort: Pulmonary effort is normal.  Abdominal:     Palpations: Abdomen is soft.  Musculoskeletal:        General: Tenderness present.     Cervical back: Normal and neck supple.     Thoracic back: Normal.       Back:     Comments: Paraspinal lumbar tenderness to palpation present that radiates to the left leg.  Pain with palpation to the left sciatic nerve joint.  No discomfort with palpation of the right sided paraspinal muscles of the lumbar spine or the right-sided sciatic nerve joint.  No redness, swelling, or warmth to the area of greatest  tenderness.  No ecchymosis present.  Patient ambulates with steady gait without limp.  +2 dorsalis pedis pulses bilaterally.  Sensation is intact to bilateral lower extremities.   Skin:    General: Skin is warm and dry.  Capillary Refill: Capillary refill takes less than 2 seconds.     Findings: No rash.  Neurological:     General: No focal deficit present.     Mental Status: She is alert and oriented to person, place, and time. Mental status is at baseline.     Cranial Nerves: No dysarthria or facial asymmetry.     Gait: Gait is intact.  Psychiatric:        Mood and Affect: Mood normal.        Speech: Speech normal.        Behavior: Behavior normal.        Thought Content: Thought content normal.        Judgment: Judgment normal.      UC Treatments / Results  Labs (all labs ordered are listed, but only abnormal results are displayed) Labs Reviewed - No data to display  EKG   Radiology No results found.  Procedures Procedures (including critical care time)  Medications Ordered in UC Medications  dexamethasone (DECADRON) injection 10 mg (10 mg Intramuscular Given 05/21/22 1213)    Initial Impression / Assessment and Plan / UC Course  I have reviewed the triage vital signs and the nursing notes.  Pertinent labs & imaging results that were available during my care of the patient were reviewed by me and considered in my medical decision making (see chart for details).  1.  Strain of lumbar region Symptomology and physical exam are most consistent with acute muscle strain of the lumbar spine related to heavy lifting at her job.  Advised patient to be more mindful of her body positioning when performing heavy lifting duties on the job and lifting with her legs and not her back.  Decadron 10 mg IM injection given in clinic for acute inflammation and radiculopathy to left leg.  Patient may start taking naproxen 500 mg tonight at 8 PM and may take this medication twice daily for  the next 2 to 3 days with food to reduce inflammation and low back pain.  Heat and gentle range of motion exercises advised.  Flexeril every 12 hours may be used as needed for muscle spasm mostly at nighttime.  Patient advised not to drink alcohol or drive while taking this medication as it can make her sleepy.  PCP follow-up advised.   Discussed physical exam and available lab work findings in clinic with patient.  Counseled patient regarding appropriate use of medications and potential side effects for all medications recommended or prescribed today. Discussed red flag signs and symptoms of worsening condition,when to call the PCP office, return to urgent care, and when to seek higher level of care in the emergency department. Patient verbalizes understanding and agreement with plan. All questions answered. Patient discharged in stable condition.  Final Clinical Impressions(s) / UC Diagnoses   Final diagnoses:  Strain of lumbar region, initial encounter     Discharge Instructions      You have been evaluated in the today for your low back pain. Your pain is most likely muscle strain which will improve on its own with time. You may go back to work on Monday and your work note is at the back of the packet.  Take naproxen '500mg'$   with food every 12 hours for the next 2-3 days consistently to treat pain and inflammation, then as needed. Do not take any other NSAID containing medicine when taking naproxen like ibuprofen , Advil, Aleve, motrin, aspirin, Voltaren, diclofenac, and other medicines. Take this medication  with a snack to avoid stomach upset.   Your first dose of naproxen when you get home may be at 8pm since you were given decadron steroid injection in the clinic today.  You may also take flexeril muscle relaxer every 12 hours.  Do not take this medication and drive or drink alcohol as it can make you sleepy.  Mainly use this medicine at nighttime as needed.  Apply heat and perform gentle  range of motion exercises to the area of greatest pain to prevent muscle stiffness and provide further pain relief.   Follow-up with your primary care provider or return to urgent care if your symptoms do not improve in the next 3 to 4 days with medications and interventions recommended today.  If you develop any new or worsening symptoms, please return to urgent care.  If your symptoms are severe, please go to the emergency room.  I hope you feel better!     ED Prescriptions     Medication Sig Dispense Auth. Provider   naproxen (NAPROSYN) 500 MG tablet Take 1 tablet (500 mg total) by mouth 2 (two) times daily. 30 tablet Talbot Grumbling, FNP   cyclobenzaprine (FLEXERIL) 10 MG tablet Take 1 tablet (10 mg total) by mouth 2 (two) times daily as needed for muscle spasms. 20 tablet Talbot Grumbling, FNP      PDMP not reviewed this encounter.   Talbot Grumbling, [place] 05/21/22 1235

## 2022-05-26 ENCOUNTER — Encounter: Payer: Medicaid Other | Admitting: Student

## 2022-05-26 NOTE — Progress Notes (Deleted)
    SUBJECTIVE:   Chief compliant/HPI: annual examination  Virginia Cardenas is a 36 y.o. who presents today for an annual exam.   Review of systems form notable for ***.   Updated history tabs and problem list ***.   OBJECTIVE:   There were no vitals taken for this visit.  ***  ASSESSMENT/PLAN:   No problem-specific Assessment & Plan notes found for this encounter.    Annual Examination  See AVS for age appropriate recommendations.   PHQ score ***, reviewed and discussed. Blood pressure reviewed and at goal ***.  Asked about intimate partner violence and patient reports ***.  The patient currently uses *** for contraception. Folate recommended as appropriate, minimum of 400 mcg per day.  Advanced directives ***   Considered the following items based upon USPSTF recommendations: HIV testing: {discussed/ordered:14545} Hepatitis C: {discussed/ordered:14545} Hepatitis B: {discussed/ordered:14545} Syphilis if at high risk: {discussed/ordered:14545} GC/CT {GC/CT screening :23818} Lipid panel (nonfasting or fasting) discussed based upon AHA recommendations and {ordered not order:23822}.  Consider repeat every 4-6 years.  Reviewed risk factors for latent tuberculosis and {not indicated/requested/declined:14582}  Discussed family history, BRCA testing {not indicated/requested/declined:14582}. Tool used to risk stratify was Pedigree Assessment tool ***  Cervical cancer screening: due for Pap today, cytology + HPV ordered Immunizations ***   Follow up in 1  *** year or sooner if indicated.      Holley Bouche, MD [place]

## 2022-07-13 ENCOUNTER — Encounter: Payer: Medicaid Other | Admitting: Student

## 2022-07-28 ENCOUNTER — Ambulatory Visit (HOSPITAL_COMMUNITY)
Admission: EM | Admit: 2022-07-28 | Discharge: 2022-07-28 | Disposition: A | Discharge status: Home / Self Care | Payer: Medicaid Other | Attending: Family Medicine | Admitting: Family Medicine

## 2022-07-28 ENCOUNTER — Encounter (HOSPITAL_COMMUNITY): Payer: Self-pay | Admitting: Emergency Medicine

## 2022-07-28 DIAGNOSIS — M545 Low back pain, unspecified: Secondary | ICD-10-CM | POA: Diagnosis not present

## 2022-07-28 MED ORDER — KETOROLAC TROMETHAMINE 60 MG/2ML IM SOLN
INTRAMUSCULAR | Status: AC
  Filled 2022-07-28: qty 2

## 2022-07-28 MED ORDER — KETOROLAC TROMETHAMINE 60 MG/2ML IM SOLN
60.0000 mg | Freq: Once | INTRAMUSCULAR | Status: AC
  Administered 2022-07-28: 60 mg via INTRAMUSCULAR

## 2022-07-28 MED ORDER — DEXAMETHASONE SODIUM PHOSPHATE 10 MG/ML IJ SOLN
10.0000 mg | Freq: Once | INTRAMUSCULAR | Status: AC
  Administered 2022-07-28: 10 mg via INTRAMUSCULAR

## 2022-07-28 MED ORDER — CYCLOBENZAPRINE HCL 10 MG PO TABS: ORAL_TABLET | ORAL | 0 refills | Status: DC

## 2022-07-28 MED ORDER — DEXAMETHASONE SODIUM PHOSPHATE 10 MG/ML IJ SOLN
INTRAMUSCULAR | Status: AC
  Filled 2022-07-28: qty 1

## 2022-07-28 NOTE — ED Provider Notes (Signed)
[place]   [id number] 07/28/22 Arrival Time: 5956  ASSESSMENT & PLAN:  1. Acute left-sided low back pain without sciatica    Able to ambulate here and hemodynamically stable. No indication for imaging of back at this time given no trauma and normal neurological exam. Discussed.  Meds ordered this encounter  Medications   ketorolac (TORADOL) injection 60 mg   dexamethasone (DECADRON) injection 10 mg   cyclobenzaprine (FLEXERIL) 10 MG tablet    Sig: Take 1 tablet by mouth before bed as needed for muscle spasm. Warning: May cause drowsiness.    Dispense:  10 tablet    Refill:  0   Work/school excuse note: provided. Medication sedation precautions given. Encourage ROM/movement as tolerated.  Recommend:  Follow-up Information     [place].   Why: If worsening or failing to improve as anticipated. Contact information: [address] [place] [phone]               Encourage ROM. Reviewed expectations re: course of current medical issues. Questions answered. Outlined signs and symptoms indicating need for more acute intervention. Patient verbalized understanding. After Visit Summary given.   SUBJECTIVE: History from: patient.  SHERNELL SALDIERNA is a 36 y.o. female who presents with complaint of fairly persistent left sided lower back discomfort. Onset gradual. First noted  approx 4 d ago . Injury/trama: no. Does lift items at work; questions relation. History of back problems requiring medical care: occasional. Pain described as aching and without radiation. Aggravating factors: certain movements and prolonged sitting. Alleviating factors: have not been identified. Progressive LE weakness or saddle anesthesia: none. Extremity sensation changes or weakness: none. Ambulatory without difficulty. Normal bowel/bladder habits: yes; without urinary retention. Normal PO intake without n/v. No  associated abdominal pain/n/v. OTC analgesics without much relief.  Reports no chronic steroid use, fevers, IV drug use, or recent back surgeries or procedures.   OBJECTIVE:  Vitals:   07/28/22 1042  BP: 108/74  Pulse: 87  Resp: 16  Temp: 98 F (36.7 C)  TempSrc: Oral  SpO2: 100%    General appearance: alert; no distress HEENT: [place]; AT Neck: supple with FROM; without midline tenderness CV: regular Lungs: unlabored respirations; speaks full sentences without difficulty Abdomen: soft, non-tender; non-distended Back: mild  and poorly localized tenderness to palpation over L lumbar paraspinal musculature ; FROM at waist; bruising: none; without midline tenderness Extremities: without edema; symmetrical without gross deformities; normal ROM of bilateral LE Skin: warm and dry Neurologic: normal gait; normal sensation and strength of bilateral LE Psychological: alert and cooperative; normal mood and affect   Allergies  Allergen Reactions   Latex Rash   Metronidazole Rash    See photo of rash in office visit note from 06/01/16    Past Medical History:  Diagnosis Date   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   Pituitary mass ([place]) 11/28/2019   Pituitary tumor 01/11/2020   Social History   Socioeconomic History   Marital status: Single    Spouse name: Not on file   Number of children: Not on file   Years of education: Not on file   Highest education level: Not on file  Occupational History   Not on file  Tobacco Use   Smoking status: Every Day    Packs/day: 0.00    Years: 0.50    Total pack years: 0.00    Types: Cigars, Cigarettes    Last attempt to quit: 12/2019  Years since quitting: 2.6   Smokeless tobacco: Never  Vaping Use   Vaping Use: Never used  Substance and Sexual Activity   Alcohol use: Yes    Comment: occ   Drug use: No   Sexual activity: Yes    Birth control/protection: None  Other Topics Concern   Not on file  Social History Narrative   Not  on file   Social Determinants of Health   Financial Resource Strain: High Risk (12/27/2019)   Overall Financial Resource Strain (CARDIA)    Difficulty of Paying Living Expenses: Very hard  Food Insecurity: Not on file  Transportation Needs: Not on file  Physical Activity: Not on file  Stress: Not on file  Social Connections: Not on file  Intimate Partner Violence: Not on file   Family History  Problem Relation Age of Onset   Cancer Neg Hx    Diabetes Neg Hx    Heart failure Neg Hx    Hyperlipidemia Neg Hx    Past Surgical History:  Procedure Laterality Date   CESAREAN SECTION     CRANIOTOMY N/A 01/11/2020   Procedure: ENDOSCOPIC TRANSNASAL TRANSPEHENOIDAL RESECTION OF PITUITARY TUMOR;  Surgeon: Consuella Lose, MD;  Location: [facility];  Service: Neurosurgery;  Laterality: N/A;  ENDOSCOPIC TRANSNASAL TRANSPEHENOIDAL RESECTION OF PITUITARY TUMOR   SINUS ENDO WITH FUSION N/A 01/11/2020   Procedure: SINUS ENDO WITH FUSION;  Surgeon: [name], MD;  Location: [facility];  Service: ENT;  Laterality: N/A;  SINUS ENDO WITH Luetta Nutting, MD 07/28/22 1206

## 2022-07-28 NOTE — ED Triage Notes (Signed)
Pt reports lower back pain x 3/4 days. Has tried Icy hot, tylenol, lidocaine patches with some relief.

## 2022-07-28 NOTE — Telephone Encounter (Signed)
Note not needed 

## 2022-07-28 NOTE — Discharge Instructions (Addendum)
Meds ordered this encounter  Medications   ketorolac (TORADOL) injection 60 mg   dexamethasone (DECADRON) injection 10 mg   cyclobenzaprine (FLEXERIL) 10 MG tablet    Sig: Take 1 tablet by mouth before bed as needed for muscle spasm. Warning: May cause drowsiness.    Dispense:  10 tablet    Refill:  0

## 2022-08-05 ENCOUNTER — Ambulatory Visit (INDEPENDENT_AMBULATORY_CARE_PROVIDER_SITE_OTHER): Payer: Medicaid Other | Admitting: Student

## 2022-08-05 ENCOUNTER — Encounter: Payer: Self-pay | Admitting: Student

## 2022-08-05 ENCOUNTER — Other Ambulatory Visit (HOSPITAL_COMMUNITY)
Admission: RE | Admit: 2022-08-05 | Discharge: 2022-08-05 | Disposition: A | Discharge status: Home / Self Care | Payer: Medicaid Other | Source: Ambulatory Visit | Attending: Family Medicine | Admitting: Family Medicine

## 2022-08-05 VITALS — BP 110/70 | HR 85 | Temp 97.7°F | Ht 66.0 in | Wt 267.6 lb

## 2022-08-05 DIAGNOSIS — Z3042 Encounter for surveillance of injectable contraceptive: Secondary | ICD-10-CM

## 2022-08-05 DIAGNOSIS — Z23 Encounter for immunization: Secondary | ICD-10-CM | POA: Diagnosis not present

## 2022-08-05 DIAGNOSIS — Z0000 Encounter for general adult medical examination without abnormal findings: Secondary | ICD-10-CM

## 2022-08-05 LAB — POCT WET PREP (WET MOUNT)
Clue Cells Wet Prep Whiff POC: POSITIVE
Trichomonas Wet Prep HPF POC: ABSENT

## 2022-08-05 LAB — POCT URINE PREGNANCY: Preg Test, Ur: NEGATIVE

## 2022-08-05 MED ORDER — MEDROXYPROGESTERONE ACETATE 150 MG/ML IM SUSY
150.0000 mg | PREFILLED_SYRINGE | Freq: Once | INTRAMUSCULAR | Status: AC
  Administered 2022-08-05: 150 mg via INTRAMUSCULAR

## 2022-08-05 NOTE — Progress Notes (Signed)
Patient presents for initial depo shot.  Urine pregnancy given which was negative.  Depo given in [place].  Site remarkable and patient tolerated well.  Reminder card given.  Virginia Cardenas, [place]

## 2022-08-05 NOTE — Progress Notes (Unsigned)
    SUBJECTIVE:   Chief compliant/HPI: annual examination  Virginia Cardenas is a 36 y.o. who presents today for an annual exam.   Weightloss: Been having pain in ankles and back from weight, wanting medication to help loose weight. Has tried eating healthy and exercise. Walking everyday up on feet and active.    Birthcontrol: Hasn't had a period since she had her tumor removed. Wanting Depot. Wanting something to start her periods back up, and wanting to become sexually active.    OBJECTIVE:   BP 110/70   Pulse 85   Temp 97.7 F (36.5 C)   Ht '5\' 6"'$  (1.676 m)   Wt 267 lb 9.6 oz (121.4 kg)   SpO2 99%   BMI 43.19 kg/m    Physical Exam Constitutional:      Appearance: Normal appearance.  Pulmonary:     Effort: Pulmonary effort is normal.  Genitourinary:    Labia:        Right: No rash or lesion.        Left: No rash or lesion.      Vagina: Normal. No vaginal discharge, erythema, tenderness, bleeding or lesions.     Cervix: No cervical motion tenderness, discharge, lesion, erythema or cervical bleeding.  Neurological:     Mental Status: She is alert.     ASSESSMENT/PLAN:   No problem-specific Assessment & Plan notes found for this encounter.    Annual Examination  See AVS for age appropriate recommendations.   Blood pressure reviewed and at goal.   Birthcontrol Patient wanting to have her cycle and protection while being sexually active. Patient requested depot provera. Informed patient that this contraceptive may not start her periods again. Will start depot provera.   Weight lost: Encouraged patient to work on portion control with increased vegetables, and protein. Also consoled patient on regular exercise, but she felt unable to do because of her work/home responsibilities. Patient was interested in weightloss drug, I told her about ozempic and it's high cost, for which she didn't want to trial at this time.   STI Testing: Patient wanting STI testing today,  as she likes to have routine checks when she comes in for visits. Patient last tested 03/06/21 and pan negative. Will perform wet prep and test for GC/Chlamydia/HIV/RPR/  Considered the following items based upon USPSTF recommendations: Hepatitis C:  Not ordered  NR 03/06/21 Hepatitis B:  Not ordered  Neg 03/06/21 Cervical cancer screening: prior Pap reviewed, repeat due in today  Follow up in 1  year or sooner if indicated.    Holley Bouche, MD [place]

## 2022-08-05 NOTE — Patient Instructions (Signed)
It was great to see you! Thank you for allowing me to participate in your care!   Our plans for today:  - Pap Smear, will call or send letter with results - STI testing, will call or send letter with results - Birthcontrol Depot shot every 3 months. May not help with returning period because of pituitary surgery.   Take care and seek immediate care sooner if you develop any concerns.   Dr. Holley Bouche, MD [place]

## 2022-08-06 LAB — CYTOLOGY - PAP
Adequacy: ABSENT
Comment: NEGATIVE
Diagnosis: NEGATIVE
High risk HPV: NEGATIVE

## 2022-08-06 LAB — RPR W/REFLEX TO TREPSURE: RPR: NONREACTIVE

## 2022-08-06 LAB — HIV ANTIBODY (ROUTINE TESTING W REFLEX): HIV Screen 4th Generation wRfx: NONREACTIVE

## 2022-08-06 LAB — CERVICOVAGINAL ANCILLARY ONLY
Chlamydia: NEGATIVE
Comment: NEGATIVE
Comment: NORMAL
Neisseria Gonorrhea: NEGATIVE

## 2022-08-06 LAB — T PALLIDUM ANTIBODY, EIA: T pallidum Antibody, EIA: NEGATIVE

## 2022-08-07 ENCOUNTER — Telehealth: Payer: Self-pay | Admitting: Student

## 2022-08-07 ENCOUNTER — Other Ambulatory Visit: Payer: Self-pay | Admitting: Student

## 2022-08-07 MED ORDER — CLINDAMYCIN PHOSPHATE 100 MG VA SUPP: 100.0000 mg | Freq: Every day | VAGINAL | 0 refills | Status: DC

## 2022-08-07 NOTE — Telephone Encounter (Signed)
Called to tell patient that her STI testing was negative for  Gonorrhea, chlamydia, syphillis, and HIV. She did however test positive for a bacterial infection called (BV) Bacterial Vaginosis. This is easily treated and I have sent a medicine to her pharmacy.   The medicine is a vaginal suppository, placing one at bed time for 3 nights.   If patient has in questions or concerns please have her reach out.

## 2022-08-09 ENCOUNTER — Telehealth: Payer: Self-pay | Admitting: Student

## 2022-08-09 NOTE — Telephone Encounter (Signed)
Called and spoke with patient about results from STI testing and Pap smear as well as wet prep. Patient + for BV and to take vaginal clindamycin suppository.   Patient notes she's picked up and started taking medicine. She reports some burning. Encouraged patient to stop medicine if irritation continues and call clinic for different Rx.

## 2022-08-24 ENCOUNTER — Telehealth: Payer: Self-pay

## 2022-08-24 NOTE — Telephone Encounter (Signed)
Patient calls nurse line requesting a change in BV medication.   She reports she used the vaginal suppositories, however feels her symptoms have not resolved. She continues to endorse a fishy odor.   She reports she works at night and used the suppositories "at night" as the directions stated. However, she feels the medication "came out" as she walked around.   Patient is requesting Flagyl.   Will forward to provider who saw patient.

## 2022-08-26 ENCOUNTER — Other Ambulatory Visit: Payer: Self-pay | Admitting: Student

## 2022-08-26 MED ORDER — METRONIDAZOLE 500 MG PO TABS: 500.0000 mg | ORAL_TABLET | Freq: Two times a day (BID) | ORAL | 0 refills | Status: AC

## 2022-08-26 NOTE — Progress Notes (Signed)
Patient calls nurse line requesting flagyl for treating BV. Patient had received intravaginal suppository tx, but notes it did not stay placed as patient used them during the night, when she was active/working, and thinks suppository would fall out.   Patient with allergy causing rash to flagyl. Will send Rx for flagyl   Patient to take twice a day for 7 days.

## 2022-08-27 ENCOUNTER — Telehealth: Payer: Self-pay | Admitting: Student

## 2022-08-27 NOTE — Telephone Encounter (Signed)
Informed patient that I had received her call about needing new tx for BV as the vaginal supository would fall out. Patient had requested flagyl and I discussed her chart noted a rash, that's uncommon for medicine. Patient was willing to try medication, and will reach out to provider if having any issues.

## 2022-08-30 ENCOUNTER — Ambulatory Visit (INDEPENDENT_AMBULATORY_CARE_PROVIDER_SITE_OTHER): Payer: Medicaid Other | Admitting: Student

## 2022-08-30 VITALS — BP 138/84 | HR 107 | Wt 266.4 lb

## 2022-08-30 DIAGNOSIS — M545 Low back pain, unspecified: Secondary | ICD-10-CM | POA: Diagnosis not present

## 2022-08-30 MED ORDER — KETOROLAC TROMETHAMINE 15 MG/ML IJ SOLN
15.0000 mg | Freq: Once | INTRAMUSCULAR | Status: AC
  Administered 2022-08-30: 15 mg via INTRAMUSCULAR

## 2022-08-30 MED ORDER — CYCLOBENZAPRINE HCL 10 MG PO TABS: ORAL_TABLET | ORAL | 0 refills | Status: DC

## 2022-08-30 MED ORDER — DEXAMETHASONE SODIUM PHOSPHATE 10 MG/ML IJ SOLN
10.0000 mg | Freq: Once | INTRAMUSCULAR | Status: AC
  Administered 2022-08-30: 10 mg via INTRAMUSCULAR

## 2022-08-30 NOTE — Progress Notes (Signed)
  SUBJECTIVE:   CHIEF COMPLAINT / HPI:   Back pain, seen in urgent care 2x given steroid inject and muscle relaxer and sent to sports med for 2 nd visit.  Patient reports pain has been an issue in back since Friday, but she woke up this morning and pain was unbearable. Notes no truama, and pain radiates into hip sometimes. No nausea/vomiting or systemic symptoms. No changes in bowel/bladder habits. Pain described as sharp and rated 10/10. Patient has had this problem before, and was given an injection that seemed to help. Patient requesting injection today.   PERTINENT  PMH / PSH:     OBJECTIVE:  BP 138/84   Pulse (!) 107   Wt 266 lb 6.4 oz (120.8 kg)   SpO2 99%   BMI 43.00 kg/m  Physical Exam Musculoskeletal:     Cervical back: Normal.     Thoracic back: Normal.     Lumbar back: Tenderness present. No swelling, edema, deformity, signs of trauma, lacerations, spasms or bony tenderness. Normal range of motion.     Comments: TTP over lumbar area of back, bilaterally, located in paraspinal muscles.       ASSESSMENT/PLAN:  Acute bilateral low back pain without sciatica Assessment & Plan: Patient comes in for acute intense back pain, described as sharp and 10/10 located in lumbar region of paraspinal muscles. Patient had full ROM, with good strength in lower extremities and denies any radiation or changes in bowel/bladder habits. 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 Less likely to be OA given age and onset of problem, unlikely to be fracture as patient has no hx of trauma and no bony tenderness. No concern for cauda equina syndrome given ability to stand/walk and no change in bathroom habits.  -Lumbar X-ray tp r/o fracture/OA -IM Toradol 15 mg and decadron 10 mg to help with pain - Flexeril  10 mg qhs -Tylenol to help manage symptoms - Can  consider Ibuprofen or Naproxen tomorrow, to help manage pain  Orders: -     Ketorolac Tromethamine -     dexAMETHasone Sodium Phosphate -     DG Lumbar Spine Complete; Future  Other orders -     Cyclobenzaprine HCl; Take 1 tablet by mouth before bed as needed for muscle spasm. Warning: May cause drowsiness.  Dispense: 10 tablet; Refill: 0   No follow-ups on file. Bess Kinds, MD 09/01/2022, 8:17 AM PGY-2, [facility]

## 2022-08-30 NOTE — Patient Instructions (Addendum)
It was great to see you! Thank you for allowing me to participate in your care!  We saw you today for back pain, it sounds like you may have strained your back. We will give you a steroid and pain injection in the clinic. We recommend you take flexeril at night to help with spasm. Avoid driving while on flexeril. If your back pain is to substantial that you cannot drive home, we recommend you arrange other means of transportation today.   Our plans for today:  - X ray back - Given shot of Toradol and decadron to help with pain - Flexeril for spasm to be taken at night, avoid work/driving, causes sedation - Recommend Tylenol to help manage symptoms - Can start using Ibuprofen or Naproxen tomorrow, to help manage pain   Take care and seek immediate care sooner if you develop any concerns.   Dr. Holley Bouche, MD [place]

## 2022-08-31 ENCOUNTER — Ambulatory Visit
Admission: RE | Admit: 2022-08-31 | Discharge: 2022-08-31 | Disposition: A | Discharge status: Home / Self Care | Payer: Medicaid Other | Source: Ambulatory Visit | Attending: Family Medicine | Admitting: Family Medicine

## 2022-08-31 DIAGNOSIS — M545 Low back pain, unspecified: Secondary | ICD-10-CM

## 2022-09-01 DIAGNOSIS — M545 Low back pain, unspecified: Secondary | ICD-10-CM | POA: Insufficient documentation

## 2022-09-01 NOTE — Assessment & Plan Note (Addendum)
Patient comes in for acute intense back pain, described as sharp and 10/10 located in lumbar region of paraspinal muscles. Patient had full ROM, with good strength in lower extremities and denies any radiation or changes in bowel/bladder habits. 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 Less likely to be OA given age and onset of problem, unlikely to be fracture as patient has no hx of trauma and no bony tenderness. No concern for cauda equina syndrome given ability to stand/walk and no change in bathroom habits.  -Lumbar X-ray tp r/o fracture/OA -IM Toradol 15 mg and decadron 10 mg to help with pain - Flexeril  10 mg qhs -Tylenol to help manage symptoms - Can consider Ibuprofen or Naproxen tomorrow, to help manage pain

## 2022-09-03 ENCOUNTER — Encounter: Payer: Self-pay | Admitting: Student

## 2022-09-04 ENCOUNTER — Emergency Department (HOSPITAL_COMMUNITY)
Admission: EM | Admit: 2022-09-04 | Discharge: 2022-09-04 | Disposition: A | Discharge status: Home / Self Care | Payer: Medicaid Other | Attending: Emergency Medicine | Admitting: Emergency Medicine

## 2022-09-04 ENCOUNTER — Encounter (HOSPITAL_COMMUNITY): Payer: Self-pay

## 2022-09-04 ENCOUNTER — Other Ambulatory Visit: Payer: Self-pay

## 2022-09-04 DIAGNOSIS — M545 Low back pain, unspecified: Secondary | ICD-10-CM

## 2022-09-04 DIAGNOSIS — Z91040 Latex allergy status: Secondary | ICD-10-CM | POA: Diagnosis not present

## 2022-09-04 LAB — POC URINE PREG, ED: Preg Test, Ur: NEGATIVE

## 2022-09-04 MED ORDER — LIDOCAINE 5 % EX PTCH
2.0000 | MEDICATED_PATCH | Freq: Once | CUTANEOUS | Status: DC
  Administered 2022-09-04: 2 via TRANSDERMAL
  Filled 2022-09-04: qty 2

## 2022-09-04 MED ORDER — LIDOCAINE 4 % EX PTCH: 1.0000 | MEDICATED_PATCH | CUTANEOUS | 0 refills | Status: DC

## 2022-09-04 MED ORDER — HYDROCODONE-ACETAMINOPHEN 5-325 MG PO TABS
2.0000 | ORAL_TABLET | Freq: Once | ORAL | Status: AC
  Administered 2022-09-04: 2 via ORAL
  Filled 2022-09-04: qty 2

## 2022-09-04 MED ORDER — NAPROXEN 500 MG PO TABS: 500.0000 mg | ORAL_TABLET | Freq: Two times a day (BID) | ORAL | 0 refills | Status: DC

## 2022-09-04 MED ORDER — METHOCARBAMOL 500 MG PO TABS
500.0000 mg | ORAL_TABLET | Freq: Every evening | ORAL | 0 refills | Status: AC
Start: 2022-09-04 — End: 2022-09-11

## 2022-09-04 MED ORDER — HYDROCODONE-ACETAMINOPHEN 5-325 MG PO TABS: 1.0000 | ORAL_TABLET | Freq: Three times a day (TID) | ORAL | 0 refills | Status: DC | PRN

## 2022-09-04 NOTE — ED Triage Notes (Signed)
Patient complains of ongoing lower back pain with radiation to legs x 4 days. 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 Started muscle relaxer with no improvement and negative xray.

## 2022-09-04 NOTE — Discharge Instructions (Addendum)
You were evaluated in the emergency department today for lower back pain.  Your back pain should be treated with medicines such as ibuprofen or aleve and this back pain should get better over the next 2 weeks.  However if you develop severe or worsening pain, low back pain with fever, numbness, weakness or inability to walk or urinate, you should return to the ER immediately.  Please follow up with your doctor this week for a recheck and for outpatient imaging scheduling.  Low back pain is discomfort in the lower back that may be due to injuries to muscles and ligaments around the spine.  Occasionally, it may be caused by a a problem to a part of the spine called a disc.  The pain may last several days or a week;  However, most patients get completely well in 4 weeks.  Self - care:  The application of heat can help soothe the pain.  Maintaining your daily activities, including walking, is encourged, as it will help you get better faster than just staying in bed.  Medications are also useful to help with pain control.  A commonly prescribed medications includes acetaminophen.  This medication is generally safe, though you should not take more than 8 of the extra strength ('500mg'$ ) pills a day.  Non steroidal anti inflammatory medications including Ibuprofen and naproxen;  These medications help both pain and swelling and are very useful in treating back pain.  They should be taken with food, as they can cause stomach upset, and more seriously, stomach bleeding.    Muscle relaxants:  These medications can help with muscle tightness that is a cause of lower back pain.  Most of these medications can cause drowsiness, and it is not safe to drive or use dangerous machinery while taking them.  You have also been provided a short course of pain medication.  This is an opioid pain medicine.  DO NOT drive or operate heavy machinery after taking this medication.  You may take 1 tablet before bed, or as needed every  6-8 hours for breakthrough pain only.    Again you will need to follow up with your primary healthcare provider in 1 to 2 days for reassessment and for outpatient imaging scheduling.  You may also consider following up with a specialist as needed, which may or may not include physical therapy, orthopedic surgery, or neurosurgery.  Be aware that if you develop new symptoms, such as a fever, leg weakness, difficulty with or loss of control of your urine or bowels, abdominal pain, or more severe pain, you will need to seek medical attention and  / or return to the Emergency department.

## 2022-09-04 NOTE — ED Provider Notes (Signed)
[facility] EMERGENCY DEPARTMENT Provider Note   CSN: [id number] Arrival date & time: 09/04/22  5784     History {Add pertinent medical, surgical, social history, OB history to HPI:1} Chief Complaint  Patient presents with   Back Pain    Virginia Cardenas is a 36 y.o. female presenting to the ED with worsening acute on chronic bilateral low back pain.  Pain usually radiates down the left side, however sometimes bounces back and forth between both sides.  Pain usually radiates down left leg and stops mid-thigh.  States today pain is presenting similarly.  Denies urinary or bowel incontinence, urinary symptoms or retention, fever, chills, midline back pain, IV drug use, or specific preceding injury.  Pain worsened by twisting movements, sitting, standing, positional changes, and coughing.  Denies numbness, tingling, or weakness of the extremities.  Initially reports symptoms starting about 4 days ago.  However after record review, patient was seen by PCP on 08/30/2022 for same.  In this note, symptoms started 08/27/2022.  She states at that visit, was given Toradol and Decadron injections, had lumbar x-rays done which were negative for fracture or OA, and was prescribed Flexeril.  She states the injections and Flexeril have not helped and the pain is worse than it ever has been.    Initially patient states she had not been seen within the last few months for similar symptoms.  However, again recent records indicate she was seen at [facility] on 07/28/22 and 05/21/22 for similar symptoms.  After inquiring about this, patient admits to recent visits for same.    The history is provided by the patient and medical records.      Home Medications Prior to Admission medications   Medication Sig Start Date End Date Taking? Authorizing Provider  HYDROcodone-acetaminophen (NORCO/VICODIN) 5-325 MG tablet Take 1 tablet by mouth every 8 (eight) hours as needed for severe pain. 09/04/22  Yes  Prince Rome, PA-C  lidocaine 4 % Place 1 patch onto the skin daily. 09/04/22  Yes Prince Rome, PA-C  naproxen (NAPROSYN) 500 MG tablet Take 1 tablet (500 mg total) by mouth 2 (two) times daily. 09/04/22  Yes Prince Rome, PA-C  clindamycin (CLEOCIN) 100 MG vaginal suppository Place 1 suppository (100 mg total) vaginally at bedtime. 08/07/22   Holley Bouche, MD  cyclobenzaprine (FLEXERIL) 10 MG tablet Take 1 tablet by mouth before bed as needed for muscle spasm. Warning: May cause drowsiness. 08/30/22   Holley Bouche, MD  permethrin (ELIMITE) 5 % cream Apply to affected area once, then once again 8 days later 05/05/22   Rising, Wells Guiles, PA-C      Allergies    Latex and Metronidazole    Review of Systems   Review of Systems  Musculoskeletal:  Positive for back pain.    Physical Exam Updated Vital Signs BP 128/89 (BP Location: Right Arm)   Pulse 100   Temp 98.4 F (36.9 C) (Oral)   Resp 16   SpO2 99%  Physical Exam Vitals and nursing note reviewed.  Constitutional:      General: She is not in acute distress.    Appearance: She is well-developed. She is not ill-appearing, toxic-appearing or diaphoretic.     Comments: Appears uncomfortable  HENT:     Head: Normocephalic and atraumatic.  Eyes:     Conjunctiva/sclera: Conjunctivae normal.  Neck:     Comments: No meningismus or torticollis Cardiovascular:     Rate and Rhythm: Normal rate and regular rhythm.  Heart sounds: No murmur heard. Pulmonary:     Effort: Pulmonary effort is normal. No respiratory distress.     Breath sounds: Normal breath sounds.  Abdominal:     Comments: Soft, nontender.  Musculoskeletal:        General: No swelling.     Cervical back: Neck supple.     Lumbar back: Positive left straight leg raise test.       Back:     Comments: Tenderness as indicated above, left more than right.  Left subjectively radiates down the left leg to the lateral mid thigh.  No midline  tenderness of spine.  No appreciated swelling, deformity, crepitus, bony tenderness, rash, ecchymosis, or erythema of the back.  Skin:    General: Skin is warm and dry.     Capillary Refill: Capillary refill takes less than 2 seconds.  Neurological:     Mental Status: She is alert and oriented to person, place, and time.     GCS: GCS eye subscore is 4. GCS verbal subscore is 5. GCS motor subscore is 6.     Cranial Nerves: No dysarthria or facial asymmetry.     Sensory: Sensation is intact.     Deep Tendon Reflexes:     Reflex Scores:      Brachioradialis reflexes are 2+ on the right side and 2+ on the left side.      Patellar reflexes are 2+ on the right side and 2+ on the left side.      Achilles reflexes are 2+ on the right side and 2+ on the left side.    Comments: Positive SLR on left.  Sensation, coordination, and overall strength appears grossly intact of lower extremities bilaterally.  No appreciated weakness of either lower extremity on exam.  Gait appears antalgic, however overall intact.  No saddle anesthesia.  No evidence of urinary or bowel incontinence.  Psychiatric:        Mood and Affect: Mood normal.     ED Results / Procedures / Treatments   Labs (all labs ordered are listed, but only abnormal results are displayed) Labs Reviewed  POC URINE PREG, ED    EKG None  Radiology No results found.  Procedures Procedures  {Document cardiac monitor, telemetry assessment procedure when appropriate:1}  Medications Ordered in ED Medications  lidocaine (LIDODERM) 5 % 2 patch (2 patches Transdermal Patch Applied 09/04/22 0946)  HYDROcodone-acetaminophen (NORCO/VICODIN) 5-325 MG per tablet 2 tablet (2 tablets Oral Given 09/04/22 0945)    ED Course/ Medical Decision Making/ A&P                           Medical Decision Making  36 y.o. female presents to the ED for concern of Back Pain   This involves an extensive number of treatment options, and is a complaint that  carries with it a high risk of complications and morbidity.  The emergent differential diagnosis prior to evaluation includes, but is not limited to: Lumbar strain, sciatica, cauda equina, spinal stenosis  This is not an exhaustive differential.   Past Medical History / Co-morbidities / Social History: Hx of low back pain, prior pituitary adenoma Social Determinants of Health include: None  Additional History:  Obtained by chart review.  Notably recent UC and PCP visits with family medicine, see for details.  Lab Tests: I ordered, and personally interpreted labs.  The pertinent results include:   Urine preg:   Imaging Studies: None  ED Course: Pt well-appearing on exam.  Nontoxic nonseptic appearing in NAD.   Patient with back pain.  Appears acute on chronic.  Per record review, has been seen for this several times over the past few months, and at least once in 2022 with same complaints.  Per further discussion with pt, she confirms same.  No neurological deficits and normal neuro exam as described above.  Without midline spinal tenderness.  Recent review of x-ray imaging negative for acute pathology.  Low concern for fracture or dislocation.  Patient can walk but states is painful.  No loss of bowel or bladder control, or saddle anesthesia.  Low concern for cauda equina, discitis, or infectious spinal process.  No fever, night sweats, weight loss, IVDU.  With history of prior pituitary adenoma, being managed/followed by PCP/ENT per record review.  Positive left SLR with pain on lower left.  Lower extremities appear neurovascularly intact.  ROM mildly limited due to elicited pain.  Pain managed in ED.  Pt reports minimal relief with muscle relaxants and anti-inflammatories.  Recently received decadron IM with mild relief.  PDMP reviewed.  Short course of pain medicine prescribed.  Heat therapy and conservative symptom mangagement discussed with patient.  Recommend close follow up with  PCP/Orthopedics to discuss outpatient imaging and possible specialist follow up.  Strict return precautions discussed.  Patient in NAD and in good condition at time of discharge.  Disposition: After consideration the patient's encounter today, I do not feel today's workup suggests an emergent condition requiring admission or immediate intervention beyond what has been performed at this time.  Safe for discharge; instructed to return immediately for worsening symptoms, change in symptoms or any other concerns.  I have reviewed the patients home medicines and have made adjustments as needed.  Discussed course of treatment with the patient, whom demonstrated understanding.  Patient in agreement and has no further questions.    I discussed this case with my attending physician Dr. Rogene Houston, who agreed with the proposed treatment course and cosigned this note.  Attending physician stated agreement with plan or made changes to plan which were implemented.     This chart was dictated using voice recognition software.  Despite best efforts to proofread, errors can occur which can change the documentation meaning.   {Document critical care time when appropriate:1} {Document review of labs and clinical decision tools ie heart score, Chads2Vasc2 etc:1}  {Document your independent review of radiology images, and any outside records:1} {Document your discussion with family members, caretakers, and with consultants:1} {Document social determinants of health affecting pt's care:1} {Document your decision making why or why not admission, treatments were needed:1} Final Clinical Impression(s) / ED Diagnoses Final diagnoses:  Bilateral low back pain, unspecified chronicity, unspecified whether sciatica present    Rx / DC Orders ED Discharge Orders          Ordered    HYDROcodone-acetaminophen (NORCO/VICODIN) 5-325 MG tablet  Every 8 hours PRN        09/04/22 1015    naproxen (NAPROSYN) 500 MG tablet  2  times daily        09/04/22 1015    lidocaine 4 %  Every 24 hours        09/04/22 1015

## 2022-09-14 ENCOUNTER — Ambulatory Visit (INDEPENDENT_AMBULATORY_CARE_PROVIDER_SITE_OTHER): Payer: Medicaid Other | Admitting: Student

## 2022-09-14 VITALS — BP 129/88 | HR 91 | Wt 264.5 lb

## 2022-09-14 DIAGNOSIS — M545 Low back pain, unspecified: Secondary | ICD-10-CM

## 2022-09-14 MED ORDER — METHOCARBAMOL 500 MG PO TABS: 500.0000 mg | ORAL_TABLET | Freq: Four times a day (QID) | ORAL | 0 refills | Status: DC | PRN

## 2022-09-14 MED ORDER — NAPROXEN 500 MG PO TABS: 500.0000 mg | ORAL_TABLET | Freq: Two times a day (BID) | ORAL | 0 refills | Status: AC

## 2022-09-14 NOTE — Assessment & Plan Note (Signed)
Seems paraspinal spasm, inflammation. Continue with PT and pain regimen below. Consider MRI if persists in 2 weeks. Lifting restriction at work given.  - Continue with PT rx - Naproxen twice daily ordered for 2 weeks (no kidney dysfunction or stomach ulcer) - Robaxin as needed; DO NOT TAKE other muscle relaxants with this  - No red flag symptoms to causes concern for neural impingement or cauda equina  - Return if symptoms persist

## 2022-09-14 NOTE — Progress Notes (Signed)
SUBJECTIVE:   CHIEF COMPLAINT / HPI:   Back Pain:  08/27/22 was onset per chart review. Patient presented to sports medicine for this after an Urgent Care visit where she received a steroid injection and muscle relaxant. At that visit with sports medicine, they obtained a lumbar XR and patient received another steroid injection in addition to Toradol injection and Flexeril. Lumbar XR was unremarkable at that time.   Pain persisted after the sports medicine visit and was given a short course of Norco/Vicodin in addition to instructions to follow up with PCP after ED visit at [facility] on 09/04/22.   Today, she notes that she has been to a few healthcare practices for the back pain. Her pain is not as bad as previous and notes that the pain is.   She has not tried physical therapy.   She reports of intermittent pain down the left leg. Patient reports that she was constipated but relates this to the medication, no other bowel or bladder incontinence. She can walk about 30-45 minutes prior to onset of the pain in the left lower back.   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  Constitutional:      Appearance: Normal appearance.  HENT:     Head: Normocephalic.     Nose: Nose normal.     Mouth/Throat:     Mouth: Mucous membranes are moist.  Eyes:     Conjunctiva/sclera: Conjunctivae normal.  Cardiovascular:     Rate and Rhythm: Normal rate and regular rhythm.  Pulmonary:     Effort: Pulmonary effort is normal.     Breath sounds: Normal breath sounds.  Musculoskeletal:     Cervical back: Normal range of motion.     Comments: Back: Normal skin, Spine with normal alignment and no deformity.  No tenderness to vertebral process  palpation.  Paraspinous muscles are tender in left lumbar region without spasm.   Range of motion is full at lumbar sacral regions but with notable pain at extremes of flexion. SLR + left, strength 5/5 bilateral LE   Skin:    Capillary Refill: Capillary refill takes less than 2 seconds.  Neurological:     General: No focal deficit present.     Mental Status: She is alert.  Psychiatric:        Mood and Affect: Mood normal.        Behavior: Behavior normal.      ASSESSMENT/PLAN:  Acute bilateral low back pain without sciatica Assessment & Plan: Seems paraspinal spasm, inflammation. Continue with PT and pain regimen below. Consider MRI if persists in 2 weeks. Lifting restriction at work given.  - Continue with PT rx - Naproxen twice daily ordered for 2 weeks (no kidney dysfunction or stomach ulcer) - Robaxin as needed; DO NOT TAKE other muscle relaxants with this  - No red flag symptoms to causes concern for neural impingement or cauda equina  - Return if symptoms persist    Orders: -     Methocarbamol; Take 1 tablet (500 mg total) by mouth every 6 (six) hours as needed for muscle spasms.  Dispense: 30 tablet; Refill: 0 -  Ambulatory referral to Physical Therapy -     Naproxen; Take 1 tablet (500 mg total) by mouth 2 (two) times daily with a meal for 14 days.  Dispense: 28 tablet; Refill: 0   Return in about 2 weeks (around 09/28/2022), or if symptoms worsen or fail to improve. Erskine Emery, MD 09/14/2022, 10:20 AM PGY-2, [place]

## 2022-09-14 NOTE — Patient Instructions (Addendum)
It was great to see you today! Thank you for choosing [facility] for your primary care. Virginia Cardenas was seen for follow up.  Today we addressed: Continuing with physical therapy, they will call you  Continue with the Naproxen twice daily for a total of 14 days, please watch for upset stomach  I have ordered Robaxin for breakthrough pain as well   If you haven't already, sign up for My Chart to have easy access to your labs results, and communication with your primary care physician.  I recommend that you always bring your medications to each appointment as this makes it easy to ensure you are on the correct medications and helps [place] not miss refills when you need them. Call the clinic at [phone] if your symptoms worsen or you have any concerns.  You should return to our clinic Return in about 2 weeks (around 09/28/2022), or if symptoms worsen or fail to improve. Please arrive 15 minutes before your appointment to ensure smooth check in process.  We appreciate your efforts in making this happen.  Thank you for allowing me to participate in your care, Erskine Emery, MD 09/14/2022, 10:06 AM PGY-2, [place]

## 2022-09-17 DIAGNOSIS — Z720 Tobacco use: Secondary | ICD-10-CM | POA: Diagnosis not present

## 2022-09-17 DIAGNOSIS — E221 Hyperprolactinemia: Secondary | ICD-10-CM | POA: Diagnosis not present

## 2022-09-17 DIAGNOSIS — N911 Secondary amenorrhea: Secondary | ICD-10-CM | POA: Diagnosis not present

## 2022-09-17 DIAGNOSIS — R635 Abnormal weight gain: Secondary | ICD-10-CM | POA: Diagnosis not present

## 2022-09-17 DIAGNOSIS — R946 Abnormal results of thyroid function studies: Secondary | ICD-10-CM | POA: Diagnosis not present

## 2022-09-17 DIAGNOSIS — E230 Hypopituitarism: Secondary | ICD-10-CM | POA: Diagnosis not present

## 2022-09-17 DIAGNOSIS — L83 Acanthosis nigricans: Secondary | ICD-10-CM | POA: Diagnosis not present

## 2022-09-17 DIAGNOSIS — R7303 Prediabetes: Secondary | ICD-10-CM | POA: Diagnosis not present

## 2022-09-17 DIAGNOSIS — D352 Benign neoplasm of pituitary gland: Secondary | ICD-10-CM | POA: Diagnosis not present

## 2022-09-21 DIAGNOSIS — D352 Benign neoplasm of pituitary gland: Secondary | ICD-10-CM | POA: Diagnosis not present

## 2022-09-23 ENCOUNTER — Other Ambulatory Visit: Payer: Self-pay | Admitting: Internal Medicine

## 2022-09-23 DIAGNOSIS — R946 Abnormal results of thyroid function studies: Secondary | ICD-10-CM

## 2022-10-08 ENCOUNTER — Ambulatory Visit
Admission: RE | Admit: 2022-10-08 | Discharge: 2022-10-08 | Disposition: A | Discharge status: Home / Self Care | Payer: Medicaid Other | Source: Ambulatory Visit | Attending: Internal Medicine | Admitting: Internal Medicine

## 2022-10-08 DIAGNOSIS — E049 Nontoxic goiter, unspecified: Secondary | ICD-10-CM | POA: Diagnosis not present

## 2022-10-08 DIAGNOSIS — R946 Abnormal results of thyroid function studies: Secondary | ICD-10-CM

## 2022-11-30 ENCOUNTER — Ambulatory Visit (INDEPENDENT_AMBULATORY_CARE_PROVIDER_SITE_OTHER): Payer: Medicaid Other | Admitting: Family Medicine

## 2022-11-30 VITALS — BP 110/62 | HR 82 | Wt 255.0 lb

## 2022-11-30 DIAGNOSIS — H543 Unqualified visual loss, both eyes: Secondary | ICD-10-CM

## 2022-11-30 DIAGNOSIS — E893 Postprocedural hypopituitarism: Secondary | ICD-10-CM | POA: Diagnosis not present

## 2022-11-30 DIAGNOSIS — R519 Headache, unspecified: Secondary | ICD-10-CM | POA: Diagnosis not present

## 2022-11-30 DIAGNOSIS — Z86018 Personal history of other benign neoplasm: Secondary | ICD-10-CM | POA: Diagnosis not present

## 2022-11-30 NOTE — Patient Instructions (Addendum)
It was nice seeing you today!  Will receive a call to have your MRI scheduled.  I referred you to an ophthalmologist.  Continue taking Tylenol and/or ibuprofen as needed for headaches in the meantime.  Stay well, Zola Button, MD [place] [phone]  --  Make sure to check out at the front desk before you leave today.  Please arrive at least 15 minutes prior to your scheduled appointments.  If you had blood work today, I will send you a MyChart message or a letter if results are normal. Otherwise, I will give you a call.  If you had a referral placed, they will call you to set up an appointment. Please give [place] a call if you don't hear back in the next 2 weeks.  If you need additional refills before your next appointment, please call your pharmacy first.

## 2022-11-30 NOTE — Progress Notes (Signed)
    SUBJECTIVE:   CHIEF COMPLAINT / HPI:  Chief Complaint  Patient presents with   Headache   Blurred Vision    Headache for a few days, central/bitemporal and intermittent. Started 3-4 days ago. Headache does go away for some time.  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    Appearance: She is obese.  HENT:     Head: Normocephalic and atraumatic.  Eyes:     General: No scleral icterus.    Extraocular Movements: Extraocular movements intact.     Pupils: Pupils are equal, round, and reactive to light.  Cardiovascular:     Rate and Rhythm: Normal rate and regular rhythm.     Heart sounds: Normal heart sounds.  Pulmonary:     Effort: Pulmonary effort is normal. No respiratory distress.     Breath sounds: Normal breath sounds.  Musculoskeletal:     Cervical back: Neck supple.  Skin:    General: Skin is warm and dry.  Neurological:     Mental Status: She is alert.     Cranial Nerves: No cranial nerve deficit.     Motor: No weakness.     Coordination: Coordination normal.        Vision Screening   Right eye Left eye Both eyes  Without correction     With correction '20/50 20/70 20/50 '$     {Show previous vital signs  (optional):23777}    ASSESSMENT/PLAN:   Headache Patient presenting a few days bitemporal headache and vision changes.  Decreased visual acuity noted on testing.  Otherwise neurologically intact.  Does not sound migrainous in nature.  Given prior history of pituitary adenoma s/p transsphenoidal pituitary resection, as well as visual deficits I think she needs further evaluation with MRI for possible recurrence of pituitary adenoma or other brain lesion. - MRI brain w/wo contrast STAT - referral to ophthalmology - continue OTC medications prn  Return if symptoms worsen or fail to improve.   Zola Button, MD [place]

## 2022-12-02 ENCOUNTER — Ambulatory Visit (HOSPITAL_COMMUNITY)
Admission: RE | Admit: 2022-12-02 | Discharge: 2022-12-02 | Disposition: A | Discharge status: Home / Self Care | Payer: Medicaid Other | Source: Ambulatory Visit | Attending: Family Medicine | Admitting: Family Medicine

## 2022-12-02 DIAGNOSIS — Z86018 Personal history of other benign neoplasm: Secondary | ICD-10-CM | POA: Insufficient documentation

## 2022-12-02 DIAGNOSIS — H543 Unqualified visual loss, both eyes: Secondary | ICD-10-CM | POA: Diagnosis not present

## 2022-12-02 DIAGNOSIS — R519 Headache, unspecified: Secondary | ICD-10-CM | POA: Diagnosis not present

## 2022-12-02 DIAGNOSIS — E893 Postprocedural hypopituitarism: Secondary | ICD-10-CM | POA: Insufficient documentation

## 2022-12-02 MED ORDER — GADOBUTROL 1 MMOL/ML IV SOLN
10.0000 mL | Freq: Once | INTRAVENOUS | Status: AC | PRN
  Administered 2022-12-02: 10 mL via INTRAVENOUS

## 2022-12-03 ENCOUNTER — Telehealth: Payer: Self-pay | Admitting: Family Medicine

## 2022-12-03 DIAGNOSIS — D497 Neoplasm of unspecified behavior of endocrine glands and other parts of nervous system: Secondary | ICD-10-CM

## 2022-12-03 NOTE — Telephone Encounter (Signed)
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  Spoke with neurosurgeon on-call Dr. Christella Noa who recommended neurosurgery outpatient follow-up and obtaining labs.  I spoke with the patient regarding the MRI findings and neurosurgery recommendations.  Gave contact information for neurosurgery office, she will schedule to make appointment.  She will plan to obtain labs at her office later this afternoon or Monday.  Future orders placed.

## 2022-12-08 DIAGNOSIS — D352 Benign neoplasm of pituitary gland: Secondary | ICD-10-CM | POA: Diagnosis not present

## 2022-12-21 DIAGNOSIS — D352 Benign neoplasm of pituitary gland: Secondary | ICD-10-CM | POA: Diagnosis not present

## 2022-12-21 DIAGNOSIS — J3489 Other specified disorders of nose and nasal sinuses: Secondary | ICD-10-CM | POA: Diagnosis not present

## 2022-12-21 DIAGNOSIS — D497 Neoplasm of unspecified behavior of endocrine glands and other parts of nervous system: Secondary | ICD-10-CM | POA: Diagnosis not present

## 2022-12-21 DIAGNOSIS — J343 Hypertrophy of nasal turbinates: Secondary | ICD-10-CM | POA: Diagnosis not present

## 2022-12-21 DIAGNOSIS — J342 Deviated nasal septum: Secondary | ICD-10-CM | POA: Diagnosis not present

## 2023-01-06 ENCOUNTER — Other Ambulatory Visit: Payer: Self-pay | Admitting: Neurosurgery

## 2023-02-08 ENCOUNTER — Other Ambulatory Visit: Payer: Self-pay | Admitting: Otolaryngology

## 2023-02-18 NOTE — Progress Notes (Signed)
Surgical Instructions    Your procedure is scheduled on Friday May 10th.  Report to [facility] Main Entrance "A" at 5:30 A.M., then check in with the Admitting office.  Call this number if you have problems the morning of surgery:  [phone]   If you have any questions prior to your surgery date call [phone]: Open Monday-Friday 8am-4pm If you experience any cold or flu symptoms such as cough, fever, chills, shortness of breath, etc. between now and your scheduled surgery, please notify us at the above number     Remember:  Do not eat or drink after midnight the night before your surgery     Take these medicines the morning of surgery with A SIP OF WATER:NONE     As of today, STOP taking any Aspirin (unless otherwise instructed by your surgeon) Aleve, Naproxen, Ibuprofen, Motrin, Advil, Goody's, BC's, all herbal medications, fish oil, and all vitamins.           Do not wear jewelry or makeup. Do not wear lotions, powders, perfumes or deodorant. Do not shave 48 hours prior to surgery.   Do not bring valuables to the hospital. Do not wear nail polish, gel polish, artificial nails, or any other type of covering on natural nails (fingers and toes) If you have artificial nails or gel coating that need to be removed by a nail salon, please have this removed prior to surgery. Artificial nails or gel coating may interfere with anesthesia's ability to adequately monitor your vital signs.  [facility] is not responsible for any belongings or valuables.    Do NOT Smoke (Tobacco/Vaping)  24 hours prior to your procedure  If you use a CPAP at night, you may bring your mask for your overnight stay.   Contacts, glasses, hearing aids, dentures or partials may not be worn into surgery, please bring cases for these belongings   For patients admitted to the hospital, discharge time will be determined by your treatment team.   Patients discharged the day of surgery will not be allowed to  drive home, and someone needs to stay with them for 24 hours.   SURGICAL WAITING ROOM VISITATION Patients having surgery or a procedure may have no more than 2 support people in the waiting area - these visitors may rotate.   Children under the age of 58 must have an adult with them who is not the patient. If the patient needs to stay at the hospital during part of their recovery, the visitor guidelines for inpatient rooms apply. Pre-op nurse will coordinate an appropriate time for 1 support person to accompany patient in pre-op.  This support person may not rotate.   Please refer to [email or web address] for the visitor guidelines for Inpatients (after your surgery is over and you are in a regular room).    Special instructions:    Oral Hygiene is also important to reduce your risk of infection.  Remember - BRUSH YOUR TEETH THE MORNING OF SURGERY WITH YOUR REGULAR TOOTHPASTE   [place]- Preparing For Surgery  Before surgery, you can play an important role. Because skin is not sterile, your skin needs to be as free of germs as possible. You can reduce the number of germs on your skin by washing with CHG (chlorahexidine gluconate) Soap before surgery.  CHG is an antiseptic cleaner which kills germs and bonds with the skin to continue killing germs even after washing.     Please do not use if you have an allergy  to CHG or antibacterial soaps. If your skin becomes reddened/irritated stop using the CHG.  Do not shave (including legs and underarms) for at least 48 hours prior to first CHG shower. It is OK to shave your face.  Please follow these instructions carefully.     Shower the NIGHT BEFORE SURGERY and the MORNING OF SURGERY with CHG Soap.   If you chose to wash your hair, wash your hair first as usual with your normal shampoo. After you shampoo, rinse your hair and body thoroughly to remove the shampoo.  Then [employer] and genitals  (private parts) with your normal soap and rinse thoroughly to remove soap.  After that Use CHG Soap as you would any other liquid soap. You can apply CHG directly to the skin and wash gently with a scrungie or a clean washcloth.   Apply the CHG Soap to your body ONLY FROM THE NECK DOWN.  Do not use on open wounds or open sores. Avoid contact with your eyes, ears, mouth and genitals (private parts). Wash Face and genitals (private parts)  with your normal soap.   Wash thoroughly, paying special attention to the area where your surgery will be performed.  Thoroughly rinse your body with warm water from the neck down.  DO NOT shower/wash with your normal soap after using and rinsing off the CHG Soap.  Pat yourself dry with a CLEAN TOWEL.  Wear CLEAN PAJAMAS to bed the night before surgery  Place CLEAN SHEETS on your bed the night before your surgery  DO NOT SLEEP WITH PETS.   Day of Surgery:  Take a shower with CHG soap. Wear Clean/Comfortable clothing the morning of surgery Do not apply any deodorants/lotions.   Remember to brush your teeth WITH YOUR REGULAR TOOTHPASTE.    If you received a COVID test during your pre-op visit, it is requested that you wear a mask when out in public, stay away from anyone that may not be feeling well, and notify your surgeon if you develop symptoms. If you have been in contact with anyone that has tested positive in the last 10 days, please notify your surgeon.    Please read over the following fact sheets that you were given.

## 2023-02-21 ENCOUNTER — Encounter (HOSPITAL_COMMUNITY): Payer: Self-pay

## 2023-02-21 ENCOUNTER — Encounter (HOSPITAL_COMMUNITY)
Admission: RE | Admit: 2023-02-21 | Discharge: 2023-02-21 | Disposition: A | Discharge status: Home / Self Care | Payer: Medicaid Other | Source: Ambulatory Visit | Attending: Neurosurgery | Admitting: Neurosurgery

## 2023-02-21 ENCOUNTER — Other Ambulatory Visit: Payer: Self-pay

## 2023-02-21 VITALS — BP 122/86 | HR 87 | Temp 98.2°F | Resp 18 | Ht 65.0 in | Wt 257.9 lb

## 2023-02-21 DIAGNOSIS — Z01812 Encounter for preprocedural laboratory examination: Secondary | ICD-10-CM | POA: Insufficient documentation

## 2023-02-21 DIAGNOSIS — Z01818 Encounter for other preprocedural examination: Secondary | ICD-10-CM

## 2023-02-21 HISTORY — DX: Headache, unspecified: R51.9

## 2023-02-21 LAB — CBC
HCT: 37 % (ref 36.0–46.0)
Hemoglobin: 12.4 g/dL (ref 12.0–15.0)
MCH: 29.8 pg (ref 26.0–34.0)
MCHC: 33.5 g/dL (ref 30.0–36.0)
MCV: 88.9 fL (ref 80.0–100.0)
Platelets: 346 10*3/uL (ref 150–400)
RBC: 4.16 MIL/uL (ref 3.87–5.11)
RDW: 15.3 % (ref 11.5–15.5)
WBC: 7.3 10*3/uL (ref 4.0–10.5)
nRBC: 0 % (ref 0.0–0.2)

## 2023-02-21 LAB — TYPE AND SCREEN
ABO/RH(D): B POS
Antibody Screen: NEGATIVE

## 2023-02-21 LAB — SURGICAL PCR SCREEN
MRSA, PCR: NEGATIVE
Staphylococcus aureus: POSITIVE — AB

## 2023-02-21 NOTE — Progress Notes (Signed)
PCP - Bess Kinds, MD Cardiologist - Denies  PPM/ICD - Denies  Chest x-ray - Denies EKG - Denies Stress Test - Denies ECHO - Denies Cardiac Cath - Denies  Sleep Study - Denies  DM: Denies  Blood Thinner Instructions: N/A Aspirin Instructions: N/A  ERAS Protcol - NPO PRE-SURGERY Ensure or G2- N/A  COVID TEST- N/A   Anesthesia review: No  Patient denies shortness of breath, fever, cough and chest pain at PAT appointment   All instructions explained to the patient, with a verbal understanding of the material. Patient agrees to go over the instructions while at home for a better understanding.The opportunity to ask questions was provided.

## 2023-02-24 NOTE — Anesthesia Preprocedure Evaluation (Addendum)
Anesthesia Evaluation  Patient identified by MRN, date of birth, ID band Patient awake    Reviewed: Allergy & Precautions, NPO status , Patient's Chart, lab work & pertinent test results  History of Anesthesia Complications Negative for: history of anesthetic complications  Airway Mallampati: III  TM Distance: >3 FB Neck ROM: Full   Comment: Previous grade II view with MAC 3, easy mask Dental  (+) Dental Advisory Given   Pulmonary neg shortness of breath, neg sleep apnea, neg COPD, neg recent URI, Current Smoker and Patient abstained from smoking.   Pulmonary exam normal breath sounds clear to auscultation       Cardiovascular negative cardio ROS  Rhythm:Regular Rate:Normal     Neuro/Psych  Headaches, neg Seizures Pituitary macroadenoma    GI/Hepatic negative GI ROS, Neg liver ROS,,,  Endo/Other  neg diabetes  Morbid obesity  Renal/GU negative Renal ROS     Musculoskeletal   Abdominal  (+) + obese  Peds  Hematology negative hematology ROS (+)   Anesthesia Other Findings   Reproductive/Obstetrics                             Anesthesia Physical Anesthesia Plan  ASA: 3  Anesthesia Plan: General   Post-op Pain Management: Tylenol PO (pre-op)*   Induction: Intravenous  PONV Risk Score and Plan: 2 and Ondansetron, Dexamethasone and Treatment may vary due to age or medical condition  Airway Management Planned: Oral ETT  Additional Equipment: Arterial line  Intra-op Plan:   Post-operative Plan: Extubation in OR  Informed Consent: I have reviewed the patients History and Physical, chart, labs and discussed the procedure including the risks, benefits and alternatives for the proposed anesthesia with the patient or authorized representative who has indicated his/her understanding and acceptance.     Dental advisory given  Plan Discussed with: CRNA and Anesthesiologist  Anesthesia  Plan Comments: (Risks of general anesthesia discussed including, but not limited to, sore throat, hoarse voice, chipped/damaged teeth, injury to vocal cords, nausea and vomiting, allergic reactions, lung infection, heart attack, stroke, and death. All questions answered. )       Anesthesia Quick Evaluation

## 2023-02-25 ENCOUNTER — Inpatient Hospital Stay (HOSPITAL_COMMUNITY): Payer: Medicaid Other | Admitting: Certified Registered"

## 2023-02-25 ENCOUNTER — Other Ambulatory Visit: Payer: Self-pay

## 2023-02-25 ENCOUNTER — Inpatient Hospital Stay (HOSPITAL_COMMUNITY)
Admission: RE | Admit: 2023-02-25 | Discharge: 2023-02-27 | DRG: 614 | Disposition: A | Discharge status: Home / Self Care | Payer: Medicaid Other | Attending: Neurosurgery | Admitting: Neurosurgery

## 2023-02-25 ENCOUNTER — Encounter (HOSPITAL_COMMUNITY): Payer: Self-pay | Admitting: Neurosurgery

## 2023-02-25 ENCOUNTER — Encounter (HOSPITAL_COMMUNITY)
Admission: RE | Disposition: A | Discharge status: Home / Self Care | Payer: Self-pay | Source: Home / Self Care | Attending: Neurosurgery

## 2023-02-25 DIAGNOSIS — D352 Benign neoplasm of pituitary gland: Secondary | ICD-10-CM | POA: Diagnosis not present

## 2023-02-25 DIAGNOSIS — F17200 Nicotine dependence, unspecified, uncomplicated: Secondary | ICD-10-CM | POA: Diagnosis not present

## 2023-02-25 DIAGNOSIS — Z6841 Body Mass Index (BMI) 40.0 and over, adult: Secondary | ICD-10-CM | POA: Diagnosis not present

## 2023-02-25 DIAGNOSIS — D497 Neoplasm of unspecified behavior of endocrine glands and other parts of nervous system: Secondary | ICD-10-CM | POA: Diagnosis not present

## 2023-02-25 DIAGNOSIS — G9741 Accidental puncture or laceration of dura during a procedure: Secondary | ICD-10-CM | POA: Diagnosis not present

## 2023-02-25 DIAGNOSIS — Z9889 Other specified postprocedural states: Secondary | ICD-10-CM | POA: Diagnosis not present

## 2023-02-25 DIAGNOSIS — F17210 Nicotine dependence, cigarettes, uncomplicated: Secondary | ICD-10-CM

## 2023-02-25 DIAGNOSIS — Z91040 Latex allergy status: Secondary | ICD-10-CM

## 2023-02-25 DIAGNOSIS — Y658 Other specified misadventures during surgical and medical care: Secondary | ICD-10-CM | POA: Diagnosis not present

## 2023-02-25 DIAGNOSIS — Z888 Allergy status to other drugs, medicaments and biological substances status: Secondary | ICD-10-CM | POA: Diagnosis not present

## 2023-02-25 DIAGNOSIS — Z86018 Personal history of other benign neoplasm: Secondary | ICD-10-CM | POA: Diagnosis present

## 2023-02-25 HISTORY — PX: TRANSPHENOIDAL APPROACH EXPOSURE: SHX6311

## 2023-02-25 HISTORY — PX: CRANIOTOMY: SHX93

## 2023-02-25 LAB — CBC
HCT: 37.9 % (ref 36.0–46.0)
Hemoglobin: 12.7 g/dL (ref 12.0–15.0)
MCH: 29.3 pg (ref 26.0–34.0)
MCHC: 33.5 g/dL (ref 30.0–36.0)
MCV: 87.3 fL (ref 80.0–100.0)
Platelets: 346 10*3/uL (ref 150–400)
RBC: 4.34 MIL/uL (ref 3.87–5.11)
RDW: 15.3 % (ref 11.5–15.5)
WBC: 7 10*3/uL (ref 4.0–10.5)
nRBC: 0 % (ref 0.0–0.2)

## 2023-02-25 LAB — POCT PREGNANCY, URINE: Preg Test, Ur: NEGATIVE

## 2023-02-25 LAB — AEROBIC/ANAEROBIC CULTURE W GRAM STAIN (SURGICAL/DEEP WOUND)

## 2023-02-25 LAB — CREATININE, SERUM
Creatinine, Ser: 0.85 mg/dL (ref 0.44–1.00)
GFR, Estimated: >60 mL/min (ref >60)

## 2023-02-25 SURGERY — CRANIOTOMY HYPOPHYSECTOMY TRANSNASAL APPROACH
Anesthesia: General

## 2023-02-25 MED ORDER — CEFAZOLIN SODIUM-DEXTROSE 2-4 GM/100ML-% IV SOLN
2.0000 g | Freq: Three times a day (TID) | INTRAVENOUS | Status: AC
  Administered 2023-02-25 (×2): 2 g via INTRAVENOUS
  Filled 2023-02-25 (×2): qty 100

## 2023-02-25 MED ORDER — THROMBIN 5000 UNITS EX SOLR
CUTANEOUS | Status: AC
  Filled 2023-02-25: qty 5000

## 2023-02-25 MED ORDER — FENTANYL CITRATE (PF) 250 MCG/5ML IJ SOLN
INTRAMUSCULAR | Status: AC
  Filled 2023-02-25: qty 5

## 2023-02-25 MED ORDER — HEMOSTATIC AGENTS (NO CHARGE) OPTIME
TOPICAL | Status: DC | PRN
  Administered 2023-02-25: 1 via TOPICAL

## 2023-02-25 MED ORDER — ACETAMINOPHEN 650 MG RE SUPP: 650.0000 mg | RECTAL | Status: DC | PRN

## 2023-02-25 MED ORDER — ONDANSETRON HCL 4 MG/2ML IJ SOLN
INTRAMUSCULAR | Status: DC | PRN
  Administered 2023-02-25: 4 mg via INTRAVENOUS

## 2023-02-25 MED ORDER — SALINE SPRAY 0.65 % NA SOLN
3.0000 | NASAL | Status: DC
  Administered 2023-02-25 – 2023-02-27 (×10): 3 via NASAL
  Filled 2023-02-25: qty 44

## 2023-02-25 MED ORDER — LIDOCAINE-EPINEPHRINE 1 %-1:100000 IJ SOLN
INTRAMUSCULAR | Status: DC | PRN
  Administered 2023-02-25: 9 mL

## 2023-02-25 MED ORDER — HEPARIN SODIUM (PORCINE) 5000 UNIT/ML IJ SOLN
5000.0000 [IU] | Freq: Three times a day (TID) | INTRAMUSCULAR | Status: DC
  Administered 2023-02-27 (×2): 5000 [IU] via SUBCUTANEOUS
  Filled 2023-02-25 (×2): qty 1

## 2023-02-25 MED ORDER — PHENYLEPHRINE 80 MCG/ML (10ML) SYRINGE FOR IV PUSH (FOR BLOOD PRESSURE SUPPORT)
PREFILLED_SYRINGE | INTRAVENOUS | Status: AC
  Filled 2023-02-25: qty 10

## 2023-02-25 MED ORDER — OXYCODONE HCL 5 MG/5ML PO SOLN: 5.0000 mg | Freq: Once | ORAL | Status: DC | PRN

## 2023-02-25 MED ORDER — ROCURONIUM BROMIDE 10 MG/ML (PF) SYRINGE
PREFILLED_SYRINGE | INTRAVENOUS | Status: AC
  Filled 2023-02-25: qty 10

## 2023-02-25 MED ORDER — HYDROCORTISONE SOD SUC (PF) 100 MG IJ SOLR
50.0000 mg | Freq: Once | INTRAMUSCULAR | Status: AC
  Administered 2023-02-25: 50 mg via INTRAVENOUS
  Filled 2023-02-25: qty 2

## 2023-02-25 MED ORDER — LIDOCAINE 2% (20 MG/ML) 5 ML SYRINGE
INTRAMUSCULAR | Status: DC | PRN
  Administered 2023-02-25: 100 mg via INTRAVENOUS

## 2023-02-25 MED ORDER — LACTATED RINGERS IV SOLN: INTRAVENOUS | Status: DC

## 2023-02-25 MED ORDER — OXYCODONE HCL 5 MG PO TABS
5.0000 mg | ORAL_TABLET | Freq: Once | ORAL | Status: AC
  Administered 2023-02-25: 5 mg via ORAL

## 2023-02-25 MED ORDER — ONDANSETRON HCL 4 MG PO TABS: 4.0000 mg | ORAL_TABLET | ORAL | Status: DC | PRN

## 2023-02-25 MED ORDER — FENTANYL CITRATE (PF) 100 MCG/2ML IJ SOLN: 25.0000 ug | INTRAMUSCULAR | Status: DC | PRN

## 2023-02-25 MED ORDER — ACETAMINOPHEN 325 MG PO TABS
650.0000 mg | ORAL_TABLET | ORAL | Status: DC | PRN
  Administered 2023-02-26: 650 mg via ORAL
  Filled 2023-02-25: qty 2

## 2023-02-25 MED ORDER — HYDROCORTISONE SOD SUC (PF) 100 MG IJ SOLR
INTRAMUSCULAR | Status: DC | PRN
  Administered 2023-02-25: 100 mg via INTRAVENOUS

## 2023-02-25 MED ORDER — LACTATED RINGERS IV SOLN: INTRAVENOUS | Status: DC | PRN

## 2023-02-25 MED ORDER — MORPHINE SULFATE (PF) 2 MG/ML IV SOLN
1.0000 mg | INTRAVENOUS | Status: DC | PRN
  Administered 2023-02-25 (×2): 2 mg via INTRAVENOUS
  Administered 2023-02-26: 1 mg via INTRAVENOUS
  Filled 2023-02-25 (×3): qty 1

## 2023-02-25 MED ORDER — OXYCODONE HCL 5 MG PO TABS
ORAL_TABLET | ORAL | Status: AC
  Filled 2023-02-25: qty 1

## 2023-02-25 MED ORDER — CHLORHEXIDINE GLUCONATE CLOTH 2 % EX PADS
6.0000 | MEDICATED_PAD | Freq: Every day | CUTANEOUS | Status: DC
  Administered 2023-02-25 – 2023-02-27 (×3): 6 via TOPICAL

## 2023-02-25 MED ORDER — THROMBIN 5000 UNITS EX SOLR
OROMUCOSAL | Status: DC | PRN
  Administered 2023-02-25: 5 mL via TOPICAL

## 2023-02-25 MED ORDER — PHENYLEPHRINE HCL-NACL 20-0.9 MG/250ML-% IV SOLN
INTRAVENOUS | Status: DC | PRN
  Administered 2023-02-25: 20 ug/min via INTRAVENOUS

## 2023-02-25 MED ORDER — ADHERUS DURAL SEALANT
PACK | TOPICAL | Status: DC | PRN
  Administered 2023-02-25: 1 via TOPICAL

## 2023-02-25 MED ORDER — LIDOCAINE-EPINEPHRINE 1 %-1:100000 IJ SOLN
INTRAMUSCULAR | Status: AC
  Filled 2023-02-25: qty 1

## 2023-02-25 MED ORDER — SODIUM CHLORIDE 0.9 % IR SOLN
Status: DC | PRN
  Administered 2023-02-25 (×2): 1000 mL

## 2023-02-25 MED ORDER — LABETALOL HCL 5 MG/ML IV SOLN: 10.0000 mg | INTRAVENOUS | Status: DC | PRN

## 2023-02-25 MED ORDER — PROPOFOL 10 MG/ML IV BOLUS
INTRAVENOUS | Status: AC
  Filled 2023-02-25: qty 20

## 2023-02-25 MED ORDER — SODIUM CHLORIDE 0.9 % IV SOLN
INTRAVENOUS | Status: DC
  Administered 2023-02-26: 75 mL/h via INTRAVENOUS

## 2023-02-25 MED ORDER — ORAL CARE MOUTH RINSE: 15.0000 mL | OROMUCOSAL | Status: DC | PRN

## 2023-02-25 MED ORDER — BUPIVACAINE HCL (PF) 0.5 % IJ SOLN
INTRAMUSCULAR | Status: AC
  Filled 2023-02-25: qty 30

## 2023-02-25 MED ORDER — DEXAMETHASONE SODIUM PHOSPHATE 10 MG/ML IJ SOLN
INTRAMUSCULAR | Status: AC
  Filled 2023-02-25: qty 1

## 2023-02-25 MED ORDER — PANTOPRAZOLE SODIUM 40 MG IV SOLR
40.0000 mg | Freq: Every day | INTRAVENOUS | Status: DC
  Administered 2023-02-25 – 2023-02-26 (×2): 40 mg via INTRAVENOUS
  Filled 2023-02-25 (×2): qty 10

## 2023-02-25 MED ORDER — HYDROCODONE-ACETAMINOPHEN 5-325 MG PO TABS
1.0000 | ORAL_TABLET | ORAL | Status: DC | PRN
  Administered 2023-02-25 – 2023-02-27 (×10): 1 via ORAL
  Filled 2023-02-25 (×10): qty 1

## 2023-02-25 MED ORDER — CEFAZOLIN SODIUM-DEXTROSE 2-4 GM/100ML-% IV SOLN: 2.0000 g | INTRAVENOUS | Status: DC

## 2023-02-25 MED ORDER — 0.9 % SODIUM CHLORIDE (POUR BTL) OPTIME
TOPICAL | Status: DC | PRN
  Administered 2023-02-25: 1000 mL

## 2023-02-25 MED ORDER — PHENYLEPHRINE 80 MCG/ML (10ML) SYRINGE FOR IV PUSH (FOR BLOOD PRESSURE SUPPORT)
PREFILLED_SYRINGE | INTRAVENOUS | Status: DC | PRN
  Administered 2023-02-25 (×4): 80 ug via INTRAVENOUS
  Administered 2023-02-25: 160 ug via INTRAVENOUS
  Administered 2023-02-25: 80 ug via INTRAVENOUS
  Administered 2023-02-25: 160 ug via INTRAVENOUS
  Administered 2023-02-25: 80 ug via INTRAVENOUS

## 2023-02-25 MED ORDER — ORAL CARE MOUTH RINSE: 15.0000 mL | Freq: Once | OROMUCOSAL | Status: AC

## 2023-02-25 MED ORDER — ACETAMINOPHEN 500 MG PO TABS
1000.0000 mg | ORAL_TABLET | Freq: Once | ORAL | Status: AC
  Administered 2023-02-25: 1000 mg via ORAL
  Filled 2023-02-25: qty 2

## 2023-02-25 MED ORDER — AMISULPRIDE (ANTIEMETIC) 5 MG/2ML IV SOLN: 10.0000 mg | Freq: Once | INTRAVENOUS | Status: DC | PRN

## 2023-02-25 MED ORDER — CHLORHEXIDINE GLUCONATE CLOTH 2 % EX PADS: 6.0000 | MEDICATED_PAD | Freq: Once | CUTANEOUS | Status: DC

## 2023-02-25 MED ORDER — EPINEPHRINE HCL (NASAL) 0.1 % NA SOLN
NASAL | Status: AC
  Filled 2023-02-25: qty 90

## 2023-02-25 MED ORDER — LABETALOL HCL 5 MG/ML IV SOLN
INTRAVENOUS | Status: AC
  Administered 2023-02-25: 10 mg via INTRAVENOUS
  Filled 2023-02-25: qty 4

## 2023-02-25 MED ORDER — CEFAZOLIN SODIUM-DEXTROSE 2-4 GM/100ML-% IV SOLN
2.0000 g | INTRAVENOUS | Status: AC
  Administered 2023-02-25: 2 g via INTRAVENOUS
  Filled 2023-02-25: qty 100

## 2023-02-25 MED ORDER — PROPOFOL 10 MG/ML IV BOLUS
INTRAVENOUS | Status: DC | PRN
  Administered 2023-02-25: 150 mg via INTRAVENOUS
  Administered 2023-02-25: 50 mg via INTRAVENOUS
  Administered 2023-02-25: 120 mg via INTRAVENOUS

## 2023-02-25 MED ORDER — FENTANYL CITRATE (PF) 100 MCG/2ML IJ SOLN
INTRAMUSCULAR | Status: AC
  Administered 2023-02-25: 25 ug
  Filled 2023-02-25: qty 2

## 2023-02-25 MED ORDER — PHENYLEPHRINE HCL (PRESSORS) 10 MG/ML IV SOLN
INTRAVENOUS | Status: AC
  Filled 2023-02-25: qty 1

## 2023-02-25 MED ORDER — SODIUM CHLORIDE 0.9 % IV SOLN: INTRAVENOUS | Status: DC | PRN

## 2023-02-25 MED ORDER — ROCURONIUM BROMIDE 10 MG/ML (PF) SYRINGE
PREFILLED_SYRINGE | INTRAVENOUS | Status: DC | PRN
  Administered 2023-02-25: 60 mg via INTRAVENOUS
  Administered 2023-02-25 (×2): 20 mg via INTRAVENOUS

## 2023-02-25 MED ORDER — MIDAZOLAM HCL 2 MG/2ML IJ SOLN
INTRAMUSCULAR | Status: AC
  Filled 2023-02-25: qty 2

## 2023-02-25 MED ORDER — LIDOCAINE 2% (20 MG/ML) 5 ML SYRINGE
INTRAMUSCULAR | Status: AC
  Filled 2023-02-25: qty 5

## 2023-02-25 MED ORDER — EPINEPHRINE HCL (NASAL) 0.1 % NA SOLN
NASAL | Status: DC | PRN
  Administered 2023-02-25: 20 mL via NASAL

## 2023-02-25 MED ORDER — SALINE SPRAY 0.65 % NA SOLN
3.0000 | NASAL | Status: DC
  Filled 2023-02-25: qty 44

## 2023-02-25 MED ORDER — CHLORHEXIDINE GLUCONATE 0.12 % MT SOLN
15.0000 mL | Freq: Once | OROMUCOSAL | Status: AC
  Administered 2023-02-25: 15 mL via OROMUCOSAL
  Filled 2023-02-25: qty 15

## 2023-02-25 MED ORDER — ONDANSETRON HCL 4 MG/2ML IJ SOLN: 4.0000 mg | INTRAMUSCULAR | Status: DC | PRN

## 2023-02-25 MED ORDER — PROMETHAZINE HCL 25 MG PO TABS: 12.5000 mg | ORAL_TABLET | ORAL | Status: DC | PRN

## 2023-02-25 MED ORDER — SUGAMMADEX SODIUM 200 MG/2ML IV SOLN
INTRAVENOUS | Status: DC | PRN
  Administered 2023-02-25: 50 mg via INTRAVENOUS
  Administered 2023-02-25: 200 mg via INTRAVENOUS

## 2023-02-25 MED ORDER — ONDANSETRON HCL 4 MG/2ML IJ SOLN
INTRAMUSCULAR | Status: AC
  Filled 2023-02-25: qty 2

## 2023-02-25 MED ORDER — FENTANYL CITRATE (PF) 100 MCG/2ML IJ SOLN
INTRAMUSCULAR | Status: DC | PRN
  Administered 2023-02-25: 25 ug via INTRAVENOUS
  Administered 2023-02-25: 50 ug via INTRAVENOUS
  Administered 2023-02-25: 75 ug via INTRAVENOUS
  Administered 2023-02-25: 50 ug via INTRAVENOUS

## 2023-02-25 MED ORDER — OXYCODONE HCL 5 MG PO TABS: 5.0000 mg | ORAL_TABLET | Freq: Once | ORAL | Status: DC | PRN

## 2023-02-25 SURGICAL SUPPLY — 117 items
APL SKNCLS STERI-STRIP NONHPOA (GAUZE/BANDAGES/DRESSINGS) ×1
ATTRACTOMAT 16X20 MAGNETIC DRP (DRAPES) ×2 IMPLANT
BAG COUNTER SPONGE SURGICOUNT (BAG) ×4 IMPLANT
BAG SPNG CNTER NS LX DISP (BAG) ×2
BENZOIN TINCTURE PRP APPL 2/3 (GAUZE/BANDAGES/DRESSINGS) ×2 IMPLANT
BLADE RAD40 ROTATE 4M 4 5PK (BLADE) IMPLANT
BLADE ROTATE TRICUT 4X13 M4 (BLADE) ×2 IMPLANT
BLADE SURG 10 STRL SS (BLADE) ×2 IMPLANT
BLADE SURG 11 STRL SS (BLADE) ×4 IMPLANT
BLADE SURG 15 STRL LF DISP TIS (BLADE) ×4 IMPLANT
BLADE SURG 15 STRL SS (BLADE) ×2
BUR DIAMOND 13X5 70D (BURR) IMPLANT
BUR DIAMOND CURV 15X5 15D (BURR) IMPLANT
BUR TAPER CHOANAL ATRESIA 30K (BURR) ×2 IMPLANT
CABLE BIPOLOR RESECTION CORD (MISCELLANEOUS) ×2 IMPLANT
CANISTER SUCT 3000ML PPV (MISCELLANEOUS) ×6 IMPLANT
CATH FOLEY LATEX FREE 16FR (CATHETERS) ×1
CATH FOLEY LF 16FR (CATHETERS) IMPLANT
COAGULATOR SUCT SWTCH 10FR 6 (ELECTROSURGICAL) IMPLANT
COVER BACK TABLE 60X90IN (DRAPES) IMPLANT
COVER MAYO STAND STRL (DRAPES) ×2 IMPLANT
COVERAGE SUPPORT O-ARM STEALTH (MISCELLANEOUS) ×1 IMPLANT
DEFOGGER MIRROR 1QT (MISCELLANEOUS) ×2 IMPLANT
DRAPE HALF SHEET 40X57 (DRAPES) ×4 IMPLANT
DRAPE INCISE IOBAN 66X45 STRL (DRAPES) ×2 IMPLANT
DRAPE MICROSCOPE SLANT 54X150 (MISCELLANEOUS) IMPLANT
DRAPE SURG 17X23 STRL (DRAPES) ×6 IMPLANT
DRESSING NASAL POPE 10X1.5X2.5 (GAUZE/BANDAGES/DRESSINGS) IMPLANT
DRSG NASAL POPE 10X1.5X2.5 (GAUZE/BANDAGES/DRESSINGS)
DURAPREP 26ML APPLICATOR (WOUND CARE) ×2 IMPLANT
ELECT COATED BLADE 2.86 ST (ELECTRODE) IMPLANT
ELECT NDL TIP 2.8 STRL (NEEDLE) ×2 IMPLANT
ELECT NEEDLE TIP 2.8 STRL (NEEDLE) ×1 IMPLANT
ELECT REM PT RETURN 9FT ADLT (ELECTROSURGICAL) ×2
ELECTRODE NDL INSULATED 6.5 (ELECTROSURGICAL) IMPLANT
ELECTRODE REM PT RTRN 9FT ADLT (ELECTROSURGICAL) ×4 IMPLANT
FEE COVERAGE SUPPORT O-ARM (MISCELLANEOUS) IMPLANT
GAUZE PACKING FOLDED 2  STR (GAUZE/BANDAGES/DRESSINGS) ×1
GAUZE PACKING FOLDED 2 STR (GAUZE/BANDAGES/DRESSINGS) ×2 IMPLANT
GAUZE SPONGE 2X2 8PLY STRL LF (GAUZE/BANDAGES/DRESSINGS) ×2 IMPLANT
GAUZE SPONGE 4X4 12PLY STRL (GAUZE/BANDAGES/DRESSINGS) ×2 IMPLANT
GLOVE BIO SURGEON STRL SZ 6.5 (GLOVE) ×2 IMPLANT
GLOVE BIO SURGEON STRL SZ7.5 (GLOVE) IMPLANT
GLOVE BIOGEL PI IND STRL 7.5 (GLOVE) ×4 IMPLANT
GLOVE ECLIPSE 7.0 STRL STRAW (GLOVE) ×2 IMPLANT
GLOVE EXAM NITRILE XL STR (GLOVE) IMPLANT
GOWN STRL REUS W/ TWL LRG LVL3 (GOWN DISPOSABLE) ×4 IMPLANT
GOWN STRL REUS W/ TWL XL LVL3 (GOWN DISPOSABLE) IMPLANT
GOWN STRL REUS W/TWL 2XL LVL3 (GOWN DISPOSABLE) ×2 IMPLANT
GOWN STRL REUS W/TWL LRG LVL3 (GOWN DISPOSABLE) ×2
GOWN STRL REUS W/TWL XL LVL3 (GOWN DISPOSABLE)
GRAFT DURAGEN MATRIX 1WX1L (Tissue) IMPLANT
HEMOSTAT ARISTA ABSORB 3G PWDR (HEMOSTASIS) IMPLANT
HEMOSTAT POWDER KIT SURGIFOAM (HEMOSTASIS) ×2 IMPLANT
HEMOSTAT SURGICEL 2X14 (HEMOSTASIS) IMPLANT
IV NS 1000ML (IV SOLUTION) ×2
IV NS 1000ML BAXH (IV SOLUTION) ×4 IMPLANT
KIT BASIN OR (CUSTOM PROCEDURE TRAY) ×4 IMPLANT
KIT DRAIN CSF ACCUDRAIN (MISCELLANEOUS) IMPLANT
KIT TURNOVER KIT B (KITS) ×4 IMPLANT
KNIFE ARACHNOID DISP AM-21-S (BLADE) IMPLANT
NDL HYPO 25GX1X1/2 BEV (NEEDLE) ×4 IMPLANT
NDL HYPO 25X1 1.5 SAFETY (NEEDLE) ×2 IMPLANT
NDL SPNL 22GX3.5 QUINCKE BK (NEEDLE) ×2 IMPLANT
NDL SPNL 25GX3.5 QUINCKE BL (NEEDLE) ×2 IMPLANT
NEEDLE HYPO 25GX1X1/2 BEV (NEEDLE) ×1 IMPLANT
NEEDLE HYPO 25X1 1.5 SAFETY (NEEDLE) ×1 IMPLANT
NEEDLE SPNL 22GX3.5 QUINCKE BK (NEEDLE) ×1 IMPLANT
NEEDLE SPNL 25GX3.5 QUINCKE BL (NEEDLE) ×1 IMPLANT
NS IRRIG 1000ML POUR BTL (IV SOLUTION) ×4 IMPLANT
PAD ARMBOARD 7.5X6 YLW CONV (MISCELLANEOUS) ×6 IMPLANT
PATTIES SURGICAL .25X.25 (GAUZE/BANDAGES/DRESSINGS) IMPLANT
PATTIES SURGICAL .5 X.5 (GAUZE/BANDAGES/DRESSINGS) IMPLANT
PATTIES SURGICAL .5 X3 (DISPOSABLE) ×4 IMPLANT
PENCIL BUTTON HOLSTER BLD 10FT (ELECTRODE) ×2 IMPLANT
SEALANT ADHERUS EXTEND TIP (MISCELLANEOUS) IMPLANT
SHEATH ENDOSCRUB 0 DEG (SHEATH) ×2 IMPLANT
SHEATH ENDOSCRUB 30 DEG (SHEATH) IMPLANT
SHEATH ENDOSCRUB 45 DEG (SHEATH) IMPLANT
SOL ANTI FOG 6CC (MISCELLANEOUS) IMPLANT
SOL ELECTROSURG ANTI STICK (MISCELLANEOUS) ×1
SOLUTION ELECTROSURG ANTI STCK (MISCELLANEOUS) ×2 IMPLANT
SPECIMEN JAR SMALL (MISCELLANEOUS) IMPLANT
SPLINT NASAL DOYLE BI-VL (GAUZE/BANDAGES/DRESSINGS) IMPLANT
SPLINT NASAL POSISEP X .6X2 (GAUZE/BANDAGES/DRESSINGS) IMPLANT
SPLINT NASAL POSISEP X2 .8X2.3 (GAUZE/BANDAGES/DRESSINGS) IMPLANT
SPONGE SURGIFOAM ABS GEL 12-7 (HEMOSTASIS) IMPLANT
SPONGE T-LAP 4X18 ~~LOC~~+RFID (SPONGE) ×2 IMPLANT
STAPLER SKIN PROX WIDE 3.9 (STAPLE) ×2 IMPLANT
STRIP CLOSURE SKIN 1/2X4 (GAUZE/BANDAGES/DRESSINGS) ×2 IMPLANT
SUCTION FRAZIER HANDLE 10FR (MISCELLANEOUS) ×1
SUCTION FRAZIER TIP 8 FR DISP (SUCTIONS) ×1
SUCTION TUBE FRAZIER 10FR DISP (MISCELLANEOUS) ×2 IMPLANT
SUCTION TUBE FRAZIER 8FR DISP (SUCTIONS) IMPLANT
SUT BONE WAX W31G (SUTURE) ×2 IMPLANT
SUT ETHILON 3 0 FSL (SUTURE) IMPLANT
SUT ETHILON 3 0 PS 1 (SUTURE) IMPLANT
SUT ETHILON 6 0 P 1 (SUTURE) IMPLANT
SUT PDS AB 4-0 RB1 27 (SUTURE) IMPLANT
SUT PDS PLUS AB 5-0 RB-1 (SUTURE) IMPLANT
SUT PLAIN 4 0 ~~LOC~~ 1 (SUTURE) IMPLANT
SUT VIC AB 4-0 P-3 18X BRD (SUTURE) IMPLANT
SUT VIC AB 4-0 P3 18 (SUTURE)
SYR CONTROL 10ML LL (SYRINGE) ×2 IMPLANT
SYR TB 1ML LUER SLIP (SYRINGE) ×4 IMPLANT
TOWEL GREEN STERILE (TOWEL DISPOSABLE) ×2 IMPLANT
TOWEL GREEN STERILE FF (TOWEL DISPOSABLE) ×4 IMPLANT
TRACKER ENT INSTRUMENT (MISCELLANEOUS) ×4 IMPLANT
TRACKER ENT PATIENT (MISCELLANEOUS) ×2 IMPLANT
TRAP SPECIMEN MUCUS 40CC (MISCELLANEOUS) IMPLANT
TRAY ENT MC OR (CUSTOM PROCEDURE TRAY) ×4 IMPLANT
TRAY FOLEY MTR SLVR 16FR STAT (SET/KITS/TRAYS/PACK) ×2 IMPLANT
TUBE CONNECTING 12X1/4 (SUCTIONS) ×2 IMPLANT
TUBING EXTENTION W/L.L. (IV SETS) IMPLANT
TUBING FEATHERFLOW (TUBING) IMPLANT
TUBING STRAIGHTSHOT EPS 5PK (TUBING) ×2 IMPLANT
WATER STERILE IRR 1000ML POUR (IV SOLUTION) ×2 IMPLANT

## 2023-02-25 NOTE — Anesthesia Procedure Notes (Signed)
Procedure Name: Intubation Date/Time: 02/25/2023 7:58 AM  Performed by: De Nurse, CRNAPre-anesthesia Checklist: Patient identified, Emergency Drugs available, Suction available and Patient being monitored Patient Re-evaluated:Patient Re-evaluated prior to induction Oxygen Delivery Method: Circle System Utilized Preoxygenation: Pre-oxygenation with 100% oxygen Induction Type: IV induction Ventilation: Mask ventilation without difficulty Laryngoscope Size: Glidescope and 3 Grade View: Grade I Tube type: Oral Number of attempts: 2 Airway Equipment and Method: Stylet and Oral airway Placement Confirmation: ETT inserted through vocal cords under direct vision, positive ETCO2 and breath sounds checked- equal and bilateral Secured at: 22 cm Tube secured with: Tape Dental Injury: Teeth and Oropharynx as per pre-operative assessment  Difficulty Due To: Difficulty was unanticipated and Difficult Airway- due to immobile epiglottis Future Recommendations: Recommend- induction with short-acting agent, and alternative techniques readily available Comments: First attempt unable to lift epiglottis, MDA attempted with same difficulty, switched to Glide Scope, grade one view some difficulty in lifting epiglottis and small oral opening.

## 2023-02-25 NOTE — Anesthesia Postprocedure Evaluation (Signed)
Anesthesia Post Note  Patient: QUATAVIA ALOIA  Procedure(s) Performed: ENDOSCOPIC TRANSPHENOIDAL RESECTION OF PITUITARY TUMOR TRANSPHENOIDAL APPROACH EXPOSURE     Patient location during evaluation: PACU Anesthesia Type: General Level of consciousness: awake Pain management: pain level controlled Vital Signs Assessment: post-procedure vital signs reviewed and stable Respiratory status: spontaneous breathing, nonlabored ventilation and respiratory function stable Cardiovascular status: blood pressure returned to baseline and stable Postop Assessment: no apparent nausea or vomiting Anesthetic complications: yes   Encounter Notable Events  Notable Event Outcome Phase Comment  Difficult to intubate - unexpected  Intraprocedure Filed from anesthesia note documentation.    Last Vitals:  Vitals:   02/25/23 1300 02/25/23 1400  BP: (!) 144/99 (!) 147/108  Pulse: 88 94  Resp: (!) 5 16  Temp:    SpO2: 94% 94%    Last Pain:  Vitals:   02/25/23 1403  TempSrc:   PainSc: Asleep                 Linton Rump

## 2023-02-25 NOTE — H&P (Signed)
Chief Complaint   Pituitary tumor  History of Present Illness  Virginia Cardenas is a 37 year old woman seen in follow-up. Recently, she underwent transsphenoidal resection of a pituitary tumor within two years ago. She did well after that surgery, initially presenting with some visual changes and headache, with improvement in symptoms postoperatively. Unfortunately, she has essentially been lost to followup over the last two years. She reports new onset of similar frontal headaches and some peripheral vision changes especially in the left eye. She therefore has recently undergone follow-up MRI of brain with pituitary protocol and comes in today to review the results. Of note, cycling visual changes and headaches, she does not report changes in her hearing, facial sensation, or any numbness tingling or weakness of the extremities.   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   Pituitary mass (HCC) 11/28/2019   Pituitary tumor 01/11/2020    Past Surgical History   Past Surgical History:  Procedure Laterality Date   CESAREAN SECTION     CRANIOTOMY N/A 01/11/2020   Procedure: ENDOSCOPIC TRANSNASAL TRANSPEHENOIDAL RESECTION OF PITUITARY TUMOR;  Surgeon: Lisbeth Renshaw, MD;  Location: [facility] OR;  Service: Neurosurgery;  Laterality: N/A;  ENDOSCOPIC TRANSNASAL TRANSPEHENOIDAL RESECTION OF PITUITARY TUMOR   SINUS ENDO WITH FUSION N/A 01/11/2020   Procedure: SINUS ENDO WITH FUSION;  Surgeon: Osborn Coho, MD;  Location: [facility] OR;  Service: ENT;  Laterality: N/A;  SINUS ENDO WITH FUSION    Social History   Social History   Tobacco Use   Smoking status: Every Day    Packs/day: 0.50    Years: 0.50    Additional pack years: 0.00    Total pack years: 0.25    Types: Cigarettes    Last attempt to quit: 12/2019    Years since quitting: 3.1    Passive exposure: Current   Smokeless tobacco: Never  Vaping Use   Vaping Use:  Some days  Substance Use Topics   Alcohol use: Yes    Comment: occ   Drug use: No    Medications   Prior to Admission medications   Medication Sig Start Date End Date Taking? Authorizing Provider  clindamycin (CLEOCIN) 100 MG vaginal suppository Place 1 suppository (100 mg total) vaginally at bedtime. Patient not taking: Reported on 02/17/2023 08/07/22   Bess Kinds, MD  lidocaine 4 % Place 1 patch onto the skin daily. Patient not taking: Reported on 02/17/2023 09/04/22   Cecil Cobbs, PA-C  methocarbamol (ROBAXIN) 500 MG tablet Take 1 tablet (500 mg total) by mouth every 6 (six) hours as needed for muscle spasms. Patient not taking: Reported on 02/17/2023 09/14/22   Alfredo Martinez, MD  permethrin (ELIMITE) 5 % cream Apply to affected area once, then once again 8 days later Patient not taking: Reported on 02/17/2023 05/05/22   Rising, Lurena Joiner, PA-C    Allergies   Allergies  Allergen Reactions   Latex Rash   Metronidazole Rash    See photo of rash in office visit note from 06/01/16    Review of Systems  ROS  Neurologic Exam  Awake, alert, oriented Memory and concentration grossly intact Speech fluent, appropriate CN grossly intact Motor exam: Upper Extremities Deltoid Bicep Tricep Grip  Right 5/5 5/5 5/5 5/5  Left 5/5 5/5 5/5 5/5   Lower Extremities IP Quad PF DF EHL  Right 5/5 5/5 5/5 5/5 5/5  Left 5/5 5/5 5/5 5/5 5/5  Sensation grossly intact to LT  Imaging  MRI demonstrates recurrence of sellar/suprasellar tumor with 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  Impression  - 37 y.o. female 14yrs s/p transsphenoidal resection of pituitary tumor, lost to follow-up then presenting with HA and large recurrence of tumor  Plan  - Will proceed with repeat endoscopic transsphenoidal resection of tumor  I have reviewed the indications for the procedure as well as the details of the procedure and the expected postoperative  course and recovery at length with the patient and family in the office. We have also reviewed in detail the risks, benefits, and alternatives to the procedure. All questions were answered and Dealie L Holtman provided informed consent to proceed.  Lisbeth Renshaw, MD [facility]

## 2023-02-25 NOTE — Op Note (Signed)
OPERATIVE NOTE  Virginia Cardenas Date/Time of Admission: 02/25/2023  5:05 AM  CSN: [id number];MRN:[id number] Attending Provider: Lisbeth Renshaw, MD Room/Bed: MCPO/NONE DOB: 06-Jan-1986 Age: 37 y.o.   Pre-Op Diagnosis: PITUITARY MACROADENOMA WITH EXTRASELLAR EXTENSION  Post-Op Diagnosis: PITUITARY MACROADENOMA WITH EXTRASELLAR EXTENSION  Procedure: Procedure(s): ENDOSCOPIC TRANSPHENOIDAL RESECTION OF PITUITARY TUMOR REPAIR OF IATROGENIC CSF LEAK  Anesthesia: General  Surgeon(s): Pryce Folts A Azalyn Sliwa, DO Lisbeth Renshaw, MD  Staff: Circulator: Cox, Anson Fret, RN Scrub Person: Key, Alessandra Bevels, RN; Carmela Rima Vendor Representative : Fransico Setters Circulator Assistant: Ivin Booty, RN  Implants: Implant Name Type Inv. Item Serial No. Manufacturer Lot No. LRB No. Used Action  GRAFT DURAGEN MATRIX [id number] - [id number] Tissue GRAFT DURAGEN MATRIX [id number]  [employer] [id number] N/A 1 Implanted    Specimens: ID Type Source Tests Collected by Time Destination  1 : Pituitary tumor Tissue Soft Tissue, Other SURGICAL PATHOLOGY Lisbeth Renshaw, MD 02/25/2023 [phone]   A : posterior septectomy defect Tissue Soft Tissue, Other AEROBIC/ANAEROBIC CULTURE W GRAM STAIN (SURGICAL/DEEP WOUND) Vesper Trant A, DO 02/25/2023 0847     Complications: CSF leak  EBL: 75 ML  Condition: stable  Operative Findings:  Large pituitary macroadenoma with suprasellar extension. Left septal deviation with bony spurring, sinonasal mucosa with moderate inflammation and edema, as well as large purulent scab in the posterior nasal cavity which was removed and cultured.  Description of Operation: Once operative consent was obtained and the site and surgery were confirmed with the patient and the operating room team, the patient was brought back to the operating room and general endotracheal anesthesia was obtained. The patient was then turned over to the ENT service, at which time the  image-guided system was attached and noted to be in good calibration. Lidocaine 1% with 1:100,000 epinephrine was injected into the nasal septum bilaterally, inferior turbinates bilaterally, the middle turbinates bilaterally, and the axilla between the medial turbinate and the lateral nasal wall. Adrenaline soaked pledgets were placed into the nasal cavity, and the patient was prepped and draped in sterile fashion. Attention was first turned to the right nasal vestibule.  The inferior and middle turbinate were gently lateralized using a Cottle elevator until the superior turbinate was identified.  This was also lateralized until the previous defect was identified.   A posterior septectomy was then performed using a combination of a Cottle elevator, Tru-Cut forceps and Kerrisons. The pedicle and overlying mucosa of the nasoseptal flap was gently dissected from the bone and preserved. Attention was then turned to the left nasal cavity, and the left middle turbinate was then identified and gently lateralized until sphenoid defect was noted. Redundant mucosa was removed using the microdebrider and a Kerrison until the mucosa overlying the tumor was noted.  The remaining bone of the sphenoid face was removed using Kerrison forceps.  A diamond bur drill was also used to complete the bony resection and to remove the intrasinus septum until the tumor was completely visualized. Dr. Conchita Paris then scrubbed in and completely resected the tumor, please see his operative note for details regarding the surgical resection.  A CSF leak was noted during tumor resection along the right lateral margin of the mucosa. This was noted to stop completely by the end of the case with application of a pledget. A segment of duragen was used to cover the surgical defect, and Adheris was used to completely cover the duragen.  Arista and Posisep nasal packing was placed in bilateral nasal cavities in the sphenoid defect.  An orogastric tube was  placed and the stomach cavity was suctioned to reduce postoperative nausea. The patient was turned over to anesthesia service and was extubated in the operating room and transferred to the PACU in stable condition.   Laren Boom, DO [facility]  02/25/2023

## 2023-02-25 NOTE — Discharge Instructions (Signed)
[facility] SINUS SURGERY (FESS) Post Operative Instructions  Office: [phone]  The Surgery Itself Endoscopic sinus surgery (with or without septoplasty and turbinate reduction) involves general anesthesia. Patients may be sedated for several hours after surgery and may remain sleepy for the better part of the day. Nausea and vomiting are occasionally seen, and usually resolve by the evening of surgery - even without additional medications.   After Surgery  Facial pressure and fullness similar to a sinus infection/headache is normal after surgery. Breathing through your nose is also difficult due to swelling. A humidifier or vaporizer can be used in the bedroom to prevent throat pain with mouth breathing.   Bloody nasal drainage is normal after this surgery for 5-7 days, usually decreasing in volume with each day that passes. Drainage will flow from the front of the nose and down the back of the throat. Make sure you spit out blood drainage that drips down the back of your throat to prevent nausea/vomiting. You will have a nasal drip pad/sling with gauze to catch drainage from the front of your nose. The dressing may need to be changed frequently during the first 24 hours following surgery. In case of profuse nasal bleeding, you may apply ice to the bridge of the nose and pinch the nose just above the tip and hold for 10 minutes; if bleeding continues, contact the doctors office.   Frequent hot showers or saline nasal rinses (NeilMed) will help break up congestion and clear any clot or mucus that builds up within the nose after surgery. This can be started the day after surgery.   It is more comfortable to sleep with extra pillows or in a recliner for the first few days after surgery until the drainage begins to resolve.    Do not blow your nose until your surgeon clears you to do so   Avoid lifting > 10 lbs. and no vigorous exercise for 2 weeks after surgery.   Avoid airplane travel  for 2 weeks following sinus surgery; the cabin pressure changes can cause pain and swelling within the nose/sinuses.   Sense of smell and taste are often diminished for several weeks after surgery. There may be some tenderness or numbness in your upper front teeth, which is normal after surgery. You may express old clot, discolored mucus or very large nasal crusts from your nose for up to 3-4 weeks after surgery; depending on how frequently and how effectively you irrigate your nose with the saltwater spray.   You may have absorbable sutures inside of your nose after surgery that will slowly dissolve in 2-3 weeks. Be careful when clearing crusts from the nose since they may be attached to these sutures.  Medications  Pain medication can be used for pain as prescribed. Pain and pressure in the nose is expected after surgery. As the surgical site heals, pain will resolve over the course of a week. Pain medications can cause nausea, which can be prevented if you take them with food or milk.   You may be given an antibiotic for one week after surgery to prevent infection. Take this medication with food to prevent nausea or vomiting.   You can use 2 nasal sprays after surgery: Afrin can be used up to 2 times a day for up to 5 days after surgery (best before bed) to reduce bloody drainage from the nose for the first few days after surgery. Saline/salt water spray should be used at least 4-6 times per day, starting  the day after surgery to prevent crusting inside of the nose.   Take all of your routine medications as prescribed, unless told otherwise by your surgeon. Any medications that thin the blood should be avoided. This includes aspirin. Avoid aspirin-like products for the first 72 hours after surgery (Advil, Motrin, Excedrin, Alieve, Celebrex, Naprosyn), but you may use them as needed for pain after 72 hours.

## 2023-02-25 NOTE — Transfer of Care (Signed)
Immediate Anesthesia Transfer of Care Note  Patient: Virginia Cardenas  Procedure(s) Performed: ENDOSCOPIC TRANSPHENOIDAL RESECTION OF PITUITARY TUMOR TRANSPHENOIDAL APPROACH EXPOSURE  Patient Location: PACU  Anesthesia Type:General  Level of Consciousness: awake and oriented  Airway & Oxygen Therapy: Patient Spontanous Breathing  Post-op Assessment: Report given to RN and Patient moving all extremities X 4  Post vital signs: Reviewed and stable  Last Vitals:  Vitals Value Taken Time  BP 116/73 02/25/23 1039  Temp    Pulse 97 02/25/23 1042  Resp 18 02/25/23 1042  SpO2 94 % 02/25/23 1042  Vitals shown include unvalidated device data.  Last Pain:  Vitals:   02/25/23 0613  TempSrc:   PainSc: 1          Complications:  Encounter Notable Events  Notable Event Outcome Phase Comment  Difficult to intubate - unexpected  Intraprocedure Filed from anesthesia note documentation.

## 2023-02-25 NOTE — Anesthesia Procedure Notes (Signed)
Arterial Line Insertion Start/End5/07/2023 8:02 AM, 02/25/2023 8:05 AM Performed by: Linton Rump, MD, De Nurse, CRNA, anesthesiologist  Patient location: Pre-op. Preanesthetic checklist: patient identified, IV checked, site marked, risks and benefits discussed, surgical consent, monitors and equipment checked, pre-op evaluation, timeout performed and anesthesia consent Lidocaine 1% used for infiltration Right, radial was placed Catheter size: 20 G Hand hygiene performed  and maximum sterile barriers used   Attempts: 1 Procedure performed using ultrasound guided (Image not saved.) technique. Following insertion, dressing applied and Biopatch. Post procedure assessment: normal and unchanged  Patient tolerated the procedure well with no immediate complications. Additional procedure comments: Attempted arterial line in preop unsuccessfully. Patient not tolerating well while awake. A-line placed on first attempt in OR once patient under GA.[name]

## 2023-02-25 NOTE — Op Note (Signed)
NEUROSURGERY OPERATIVE NOTE   PREOP DIAGNOSIS:  Recurrent pituitary tumor  POSTOP DIAGNOSIS: Same  PROCEDURE: Endoscopic transnasal transsphenoidal resection of pituitary tumor  SURGEON: Dr. Lisbeth Renshaw, MD  CO-SURGEON: Dr. Cheron Schaumann, DO  ANESTHESIA: General Endotracheal  EBL: Minimal  SPECIMENS: Pituitary Tumor for permanent pathology  DRAINS: None  COMPLICATIONS: None immediate  CONDITION: Hemodynamically stable to PACU  HISTORY: Virginia Cardenas is a 37 y.o. female with a history of previously diagnosed pituitary tumor who underwent endoscopic transnasal transsphenoidal resection approximately 3 years ago.  Unfortunately, patient was lost to follow-up however presented back to the office with recurrence of headaches.  MRI did reveal large recurrent tumor with significant suprasellar extension and compression of the optic apparatus.  Repeat surgical resection was therefore recommended.  The risks, benefits, and alternatives to surgery were all reviewed in detail with the patient.  After all her questions were answered informed consent was obtained and witnessed.  PROCEDURE IN DETAIL: The patient was brought to the operating room. After induction of general anesthesia, the patient was positioned on the operative table in the supine position. All pressure points were meticulously padded.  Skin of the mid face was then prepped and draped in the usual sterile fashion.  After timeout was conducted, approach to the sella was conducted by Dr. Marene Lenz, details of which are dictated in a separate report.  Once the anterior aspect of the sella was visualized, the mucosa overlying the previous defect was identified and carefully elevated.  We identified the bone edges from the previous approach.  Kerrison punches were used to slightly widen the exposure both laterally and inferiorly.  At this point, the 11 blade scalpel was used to open the anterior aspect of the sella in  cruciate fashion.  The dura was then dissected away from the underlying tumor which was noted to be somewhat purple, very soft in consistency and easily suckable.  Multiple large pieces of tumor were easily removed with ring curettes and pituitary rongeur's and sent for permanent pathology.  Initially, ring curettes were used to remove tumor from the inferior aspect of the sella along the floor of the hypophysis.  I was then able to identify the dorsum sella.  We then began working on the right side of the sella.  As tumor was removed more superiorly on the right, we did note a very small opening in the arachnoid, with initial egress of CSF.  This was covered with a small cottonoid, as the remainder of the resection continued.  After removing tumor on the right side, angled curettes were used to remove tumor more superiorly.  Once this was done, it did appear that we were looking at the diaphragm sella, without any residual tumor remaining superiorly.  Attention was then finally turned to the left side of the sella.  I did use Kerrison punches to remove more of the left lateral aspect of the bony opening.  Angled curettes were then used to remove more tumor along the left side of the sella adjacent to the cavernous sinus.  Once this was done, and no further tumor was identified and the ring curettes, it did also appear that I was able to palpate the edge of the cavernous sinus.  At this point having completed the resection, the cottonoid was removed.  No further CSF egress was noted.  Hemostasis was easily achieved with a combination of morselized Gelfoam with thrombin.  The sella was then covered with a small piece of DuraGen and a  layer of polyethylene glycol sealant.  No CSF egress again was noted.  The transnasal exposure was then closed by Dr. Marene Lenz, details again dictated separately.  At the end of the case all sponge, needle, instrument, and cottonoid counts were correct.  Patient was then extubated  and taken to the postanesthesia care unit in stable hemodynamic condition.  Lisbeth Renshaw, MD [facility]

## 2023-02-26 ENCOUNTER — Encounter (HOSPITAL_COMMUNITY): Payer: Self-pay | Admitting: Neurosurgery

## 2023-02-26 LAB — BASIC METABOLIC PANEL
Anion gap: 10 (ref 5–15)
BUN: 6 mg/dL (ref 6–20)
CO2: 25 mmol/L (ref 22–32)
Calcium: 8.5 mg/dL — ABNORMAL LOW (ref 8.9–10.3)
Chloride: 103 mmol/L (ref 98–111)
Creatinine, Ser: 0.7 mg/dL (ref 0.44–1.00)
GFR, Estimated: >60 mL/min (ref >60)
Glucose, Bld: 111 mg/dL — ABNORMAL HIGH (ref 70–99)
Potassium: 4.9 mmol/L (ref 3.5–5.1)
Sodium: 138 mmol/L (ref 135–145)

## 2023-02-26 LAB — CORTISOL-AM, BLOOD: Cortisol - AM: 4.1 ug/dL — ABNORMAL LOW (ref 6.7–22.6)

## 2023-02-26 NOTE — Progress Notes (Signed)
  NEUROSURGERY PROGRESS NOTE   Pt seen and examined. No issues overnight. Pt reports nasal congestion, no drainage. Minimal HA. No visual changes.  EXAM: Temp:  [97.2 F (36.2 C)-98.2 F (36.8 C)] 98.2 F (36.8 C) (05/11 0800) Pulse Rate:  [69-99] 82 (05/11 0700) Resp:  [0-16] 15 (05/11 0700) BP: (116-162)/(53-115) 138/86 (05/11 0700) SpO2:  [91 %-100 %] 97 % (05/11 0700) Arterial Line BP: (148-179)/(78-106) 166/90 (05/10 1500) Intake/Output      05/10 0701 05/11 0700 05/11 0701 05/12 0700   P.O. 400    I.V. (mL/kg) 2537.7 (21)    IV Piggyback 200    Total Intake(mL/kg) 3137.7 (25.9)    Urine (mL/kg/hr) 2975 (1)    Blood 30    Total Output 3005    Net +132.7          Awake, alert, oriented Speech fluent CN grossly intact MAE well  LABS: Lab Results  Component Value Date   CREATININE 0.70 02/26/2023   BUN 6 02/26/2023   NA 138 02/26/2023   K 4.9 02/26/2023   CL 103 02/26/2023   CO2 25 02/26/2023   Lab Results  Component Value Date   WBC 7.0 02/25/2023   HGB 12.7 02/25/2023   HCT 37.9 02/25/2023   MCV 87.3 02/25/2023   PLT 346 02/25/2023    IMPRESSION: - 37 y.o. female POD#1 repeat resection of pituitary tumor, doing well. No evidence of CSF rhinorrhea.  PLAN: - can d/c Foley but cont to monitor UO - f/u AM cortisol - Mobilize today - If UO stable and cortisol normal, can consider d/c home tomorrow.   Lisbeth Renshaw, MD [facility]

## 2023-02-26 NOTE — Plan of Care (Signed)
Patient progressing toward goals. Pain control main focus overnight. Neuro status intact and unchanged. Monitor for excessive U/O

## 2023-02-27 LAB — T4, FREE: Free T4: 0.56 ng/dL — ABNORMAL LOW (ref 0.61–1.12)

## 2023-02-27 LAB — AEROBIC/ANAEROBIC CULTURE W GRAM STAIN (SURGICAL/DEEP WOUND)

## 2023-02-27 LAB — CORTISOL-AM, BLOOD: Cortisol - AM: 3.2 ug/dL — ABNORMAL LOW (ref 6.7–22.6)

## 2023-02-27 LAB — TSH: TSH: 2.007 u[IU]/mL (ref 0.350–4.500)

## 2023-02-27 MED ORDER — HYDROCORTISONE 5 MG PO TABS: 5.0000 mg | ORAL_TABLET | Freq: Every evening | ORAL | 1 refills | Status: DC

## 2023-02-27 MED ORDER — SALINE SPRAY 0.65 % NA SOLN: 3.0000 | NASAL | 0 refills | Status: DC

## 2023-02-27 MED ORDER — HYDROCORTISONE SOD SUC (PF) 100 MG IJ SOLR
50.0000 mg | Freq: Once | INTRAMUSCULAR | Status: AC
  Administered 2023-02-27: 50 mg via INTRAVENOUS
  Filled 2023-02-27: qty 2

## 2023-02-27 MED ORDER — HYDROCORTISONE 10 MG PO TABS: 10.0000 mg | ORAL_TABLET | Freq: Every morning | ORAL | 1 refills | Status: DC

## 2023-02-27 MED ORDER — HYDROCORTISONE 5 MG PO TABS
5.0000 mg | ORAL_TABLET | Freq: Every evening | ORAL | Status: DC
  Filled 2023-02-27: qty 1

## 2023-02-27 MED ORDER — HYDROCODONE-ACETAMINOPHEN 5-325 MG PO TABS: 1.0000 | ORAL_TABLET | Freq: Four times a day (QID) | ORAL | 0 refills | Status: AC | PRN

## 2023-02-27 MED ORDER — HYDROCORTISONE 10 MG PO TABS: 10.0000 mg | ORAL_TABLET | Freq: Every morning | ORAL | Status: DC

## 2023-02-27 NOTE — Progress Notes (Signed)
  NEUROSURGERY PROGRESS NOTE   Pt seen and examined. No issues overnight. Pt cont to report nasal congestion, no drainage. Minimal HA. No visual changes.  EXAM: Temp:  [98 F (36.7 C)-98.6 F (37 C)] 98.3 F (36.8 C) (05/12 0400) Pulse Rate:  [61-79] 63 (05/12 0700) Resp:  [10-28] 17 (05/12 0700) BP: (107-158)/(71-102) 143/99 (05/12 0700) SpO2:  [93 %-99 %] 97 % (05/12 0700) Intake/Output      05/11 0701 05/12 0700 05/12 0701 05/13 0700   P.O.     I.V. (mL/kg) 1719.3 (14.2)    IV Piggyback     Total Intake(mL/kg) 1719.3 (14.2)    Urine (mL/kg/hr) 1075 (0.4)    Blood     Total Output 1075    Net +644.3         Urine Occurrence 3 x     Awake, alert, oriented Speech fluent CN grossly intact MAE well  LABS: Lab Results  Component Value Date   CREATININE 0.70 02/26/2023   BUN 6 02/26/2023   NA 138 02/26/2023   K 4.9 02/26/2023   CL 103 02/26/2023   CO2 25 02/26/2023   Lab Results  Component Value Date   WBC 7.0 02/25/2023   HGB 12.7 02/25/2023   HCT 37.9 02/25/2023   MCV 87.3 02/25/2023   PLT 346 02/25/2023    IMPRESSION: - 37 y.o. female POD#2 repeat resection of pituitary tumor, doing well. No evidence of CSF rhinorrhea. Yesterday am cortisol low, question maybe suppressed from stress dose steroids on day of surgery.  PLAN: - Will recheck am cortisol today - Cont to mobilize - If cortisol level ok, likely d/c home today   Lisbeth Renshaw, MD [facility]

## 2023-02-27 NOTE — Discharge Summary (Signed)
Physician Discharge Summary  Patient ID: Virginia Cardenas MRN: [id number] DOB/AGE: 20-Dec-1985 37 y.o.  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 female Admitted after elective endoscopic transfer noidal resection of pituitary tumor. She was at neurologic baseline post-operatively. She did not complain of any nasal discharge. No evidence of diabetes insipidus. Her and cortisol remained relatively low on post-operative day one and two and she was started on exogenous cortisol. TSH and free T4 were also drawn prior to her discharge for possible outpatient thyroid replacement as well.  Treatments: Surgery - transsphenoidal resection of pituitary tumor  Discharge Exam: Blood pressure (!) 139/92, pulse 76, temperature 98.3 F (36.8 C), temperature source Oral, resp. rate 20, height 5\' 5"  (1.651 m), weight 121.1 kg, last menstrual period 07/30/2022, SpO2 98 %. Awake, alert, oriented Speech fluent, appropriate CN grossly intact 5/5 BUE/BLE  Disposition: Discharge disposition: 01-Home or Self Care       Discharge Instructions     Call MD for:  redness, tenderness, or signs of infection (pain, swelling, redness, odor or green/yellow discharge around incision site)   Complete by: As directed    Call MD for:  temperature >100.4   Complete by: As directed    Diet - low sodium heart healthy   Complete by: As directed    Discharge instructions   Complete by: As directed    Walk at home as much as possible, at least 4 times / day   Increase activity slowly   Complete by: As directed    Lifting restrictions   Complete by: As directed    No lifting > 10 lbs   May shower / Bathe   Complete by: As directed    48 hours after surgery   May walk up steps   Complete  by: As directed    No dressing needed   Complete by: As directed    Other Restrictions   Complete by: As directed    No bending/twisting at waist      Allergies as of 02/27/2023       Reactions   Latex Rash   Metronidazole Rash   See photo of rash in office visit note from 06/01/16        Medication List     STOP taking these medications    clindamycin 100 MG vaginal suppository Commonly known as: CLEOCIN   lidocaine 4 %   methocarbamol 500 MG tablet Commonly known as: ROBAXIN   permethrin 5 % cream Commonly known as: ELIMITE       TAKE these medications    HYDROcodone-acetaminophen 5-325 MG tablet Commonly known as: NORCO/VICODIN Take 1 tablet by mouth every 6 (six) hours as needed for up to 5 days for moderate pain.   hydrocortisone 5 MG tablet Commonly known as: CORTEF Take 1 tablet (5 mg total) by mouth every evening.   hydrocortisone 10 MG tablet Commonly known as: CORTEF Take 1 tablet (10 mg total) by mouth every morning.   sodium chloride 0.65 % Soln nasal spray Commonly known as: OCEAN Place 3 sprays into both nostrils every 4 (four) hours.               Discharge Care Instructions  (From admission, onward)  Start     Ordered   02/27/23 0000  No dressing needed        02/27/23 1325            Follow-up Information     Skotnicki, Meghan A, DO Follow up on 03/11/2023.   Specialty: Otolaryngology Why: Follow up as scheduled on 05/24 and 06/10 Contact information: [address] [place] [address] [phone]         Lisbeth Renshaw, MD Follow up in 2 week(s).   Specialty: Neurosurgery Contact information: [address] [place] [address] [phone]                 Signed: Jackelyn Hoehn 02/27/2023, 1:26 PM

## 2023-02-27 NOTE — Progress Notes (Signed)
RNCM contacted Jasmine at [employer] for RW.  DME to be delivered to patient's room prior to discharge.

## 2023-02-28 ENCOUNTER — Encounter (HOSPITAL_COMMUNITY): Payer: Self-pay | Admitting: Neurosurgery

## 2023-02-28 LAB — AEROBIC/ANAEROBIC CULTURE W GRAM STAIN (SURGICAL/DEEP WOUND)

## 2023-03-01 LAB — SURGICAL PATHOLOGY

## 2023-03-02 LAB — AEROBIC/ANAEROBIC CULTURE W GRAM STAIN (SURGICAL/DEEP WOUND)

## 2023-03-07 DIAGNOSIS — J343 Hypertrophy of nasal turbinates: Secondary | ICD-10-CM | POA: Diagnosis not present

## 2023-03-07 DIAGNOSIS — D497 Neoplasm of unspecified behavior of endocrine glands and other parts of nervous system: Secondary | ICD-10-CM | POA: Diagnosis not present

## 2023-03-07 DIAGNOSIS — J342 Deviated nasal septum: Secondary | ICD-10-CM | POA: Diagnosis not present

## 2023-03-22 DIAGNOSIS — E221 Hyperprolactinemia: Secondary | ICD-10-CM | POA: Diagnosis not present

## 2023-03-22 DIAGNOSIS — E049 Nontoxic goiter, unspecified: Secondary | ICD-10-CM | POA: Diagnosis not present

## 2023-03-22 DIAGNOSIS — E230 Hypopituitarism: Secondary | ICD-10-CM | POA: Diagnosis not present

## 2023-03-22 DIAGNOSIS — L83 Acanthosis nigricans: Secondary | ICD-10-CM | POA: Diagnosis not present

## 2023-03-22 DIAGNOSIS — R7309 Other abnormal glucose: Secondary | ICD-10-CM | POA: Diagnosis not present

## 2023-03-22 DIAGNOSIS — D352 Benign neoplasm of pituitary gland: Secondary | ICD-10-CM | POA: Diagnosis not present

## 2023-03-22 DIAGNOSIS — N911 Secondary amenorrhea: Secondary | ICD-10-CM | POA: Diagnosis not present

## 2023-03-25 DIAGNOSIS — R7309 Other abnormal glucose: Secondary | ICD-10-CM | POA: Diagnosis not present

## 2023-03-25 DIAGNOSIS — D352 Benign neoplasm of pituitary gland: Secondary | ICD-10-CM | POA: Diagnosis not present

## 2023-03-25 DIAGNOSIS — E049 Nontoxic goiter, unspecified: Secondary | ICD-10-CM | POA: Diagnosis not present

## 2023-03-25 DIAGNOSIS — E221 Hyperprolactinemia: Secondary | ICD-10-CM | POA: Diagnosis not present

## 2023-03-28 DIAGNOSIS — Z9889 Other specified postprocedural states: Secondary | ICD-10-CM | POA: Diagnosis not present

## 2023-03-28 DIAGNOSIS — D497 Neoplasm of unspecified behavior of endocrine glands and other parts of nervous system: Secondary | ICD-10-CM | POA: Diagnosis not present

## 2023-03-28 DIAGNOSIS — J3489 Other specified disorders of nose and nasal sinuses: Secondary | ICD-10-CM | POA: Diagnosis not present

## 2023-03-28 DIAGNOSIS — J342 Deviated nasal septum: Secondary | ICD-10-CM | POA: Diagnosis not present

## 2023-04-29 DIAGNOSIS — D352 Benign neoplasm of pituitary gland: Secondary | ICD-10-CM | POA: Diagnosis not present

## 2023-04-29 DIAGNOSIS — J343 Hypertrophy of nasal turbinates: Secondary | ICD-10-CM | POA: Diagnosis not present

## 2023-04-29 DIAGNOSIS — L83 Acanthosis nigricans: Secondary | ICD-10-CM | POA: Diagnosis not present

## 2023-04-29 DIAGNOSIS — E230 Hypopituitarism: Secondary | ICD-10-CM | POA: Diagnosis not present

## 2023-04-29 DIAGNOSIS — J342 Deviated nasal septum: Secondary | ICD-10-CM | POA: Diagnosis not present

## 2023-04-29 DIAGNOSIS — D497 Neoplasm of unspecified behavior of endocrine glands and other parts of nervous system: Secondary | ICD-10-CM | POA: Diagnosis not present

## 2023-04-29 DIAGNOSIS — N946 Dysmenorrhea, unspecified: Secondary | ICD-10-CM | POA: Diagnosis not present

## 2023-04-29 DIAGNOSIS — E049 Nontoxic goiter, unspecified: Secondary | ICD-10-CM | POA: Diagnosis not present

## 2023-04-29 DIAGNOSIS — E039 Hypothyroidism, unspecified: Secondary | ICD-10-CM | POA: Diagnosis not present

## 2023-05-23 ENCOUNTER — Ambulatory Visit (INDEPENDENT_AMBULATORY_CARE_PROVIDER_SITE_OTHER): Payer: Medicaid Other | Admitting: Family Medicine

## 2023-05-23 ENCOUNTER — Encounter: Payer: Self-pay | Admitting: Family Medicine

## 2023-05-23 VITALS — BP 117/87 | HR 77 | Ht 68.0 in | Wt 255.2 lb

## 2023-05-23 DIAGNOSIS — E049 Nontoxic goiter, unspecified: Secondary | ICD-10-CM | POA: Diagnosis not present

## 2023-05-23 DIAGNOSIS — E669 Obesity, unspecified: Secondary | ICD-10-CM | POA: Diagnosis not present

## 2023-05-23 NOTE — Progress Notes (Signed)
    SUBJECTIVE:   CHIEF COMPLAINT / HPI:   Weight loss concerns Patient has had difficulty losing weight despite healthy lifestyle changes like increased water intake, walking a mile per day, and eating a healthy diet overall. Her diet consists of vegetables and fruit, low sugar, low saturated fats. She is interested in trying medications for weight loss.   Prediabetes Patient recalls getting a HgbA1c measured in the past that was in the prediabetic range. She would like to recheck this today.   PERTINENT  PMH / PSH:  Pituitary tumor s/p transsphenoidal pituitary resection x2 (11/2019, 02/2023); enlarged thyroid   OBJECTIVE:   BP 117/87   Pulse 77   Ht 5\' 8"  (1.727 m)   Wt 255 lb 3.2 oz (115.8 kg)   SpO2 100%   BMI 38.80 kg/m   General: Well-appearing, no acute distress Neck: Enlarged thyroid palpable. Increased firmness around thyroid bilaterally. No lymphadenopathy.  CV: Normal S1/S2. No extra heart sounds. Warm and well-perfused. Pulm: CTAB. No increased WOB. Abd: Soft, nontender, nondistended Skin: Warm, dry Psych: Appropriate and pleasant   ASSESSMENT/PLAN:   Weight loss concerns: Patient has been experiencing difficulty and frustration with weight loss despite healthy lifestyle behaviors. She is interested in medications for weight loss.  - Discussed medication options for weight loss - Will check HgbA1c today before considering the start of medication - Patient to return for follow up appt to discuss medication use - Encouraged continued healthy lifestyle behaviors   Prediabetes: Patient recalls a history of elevated HgbA1c in the prediabetic range. This value does not appear in the patient record, will measure today.  - check HgbA1c today  Enlarged thyroid: Patient has been taking levothyroxine and is not having difficulty or pain with swallowing. Most recent thyroid US (09/2022) showed mild enlargement without discrete nodules. Most recent TSH 2.007 (02/2023).  -  repeat thyroid US scheduled (05/26/23) for recheck - continue home meds   Health maintenance - Lipid panel today    Ivery Quale, MD [facility]

## 2023-05-23 NOTE — Patient Instructions (Signed)
Thank you for coming to clinic today - it was great to see you!  Today we discussed your thyroid health and weight loss concerns:  - For your thyroid health, we have scheduled an ultrasound of your thyroid to check in on it. Otherwise, please continue taking your levothyroxine medication as you have been.  - For weight loss concerns, we discussed medication options for weight loss. Before starting anything, we would like to check some labs today, including your HgbA1c and cholesterol. Please follow up in one week to further discuss weight loss medication. Until then, please keep up the great work with walking and healthy eating!  Please call to schedule an appointment in 1 week. Thanks so much and see you then!

## 2023-05-24 ENCOUNTER — Telehealth: Payer: Self-pay | Admitting: Family Medicine

## 2023-05-24 NOTE — Telephone Encounter (Signed)
Called and spoke with patient. Discussed recent lab results, including elevated HgA1c and cholesterol values. Discussed that there is no indication to begin medications at the time, encourage continued healthy lifestyle behaviors like exercise and healthy diet.

## 2023-05-26 ENCOUNTER — Ambulatory Visit (HOSPITAL_COMMUNITY): Payer: Medicaid Other

## 2023-06-01 ENCOUNTER — Other Ambulatory Visit: Payer: Self-pay

## 2023-06-01 ENCOUNTER — Encounter: Payer: Self-pay | Admitting: Family Medicine

## 2023-06-01 ENCOUNTER — Ambulatory Visit (INDEPENDENT_AMBULATORY_CARE_PROVIDER_SITE_OTHER): Payer: Medicaid Other | Admitting: Family Medicine

## 2023-06-01 DIAGNOSIS — R7303 Prediabetes: Secondary | ICD-10-CM | POA: Diagnosis not present

## 2023-06-01 MED ORDER — SEMAGLUTIDE-WEIGHT MANAGEMENT 0.25 MG/0.5ML ~~LOC~~ SOAJ: 0.2500 mg | SUBCUTANEOUS | 0 refills | Status: AC

## 2023-06-01 NOTE — Assessment & Plan Note (Signed)
Patient's HgbA1c in prediabetic range (6.2).  - Counseled on continued exercise, healthy diet, and smoking cessation to prevent development of diabetes - Recheck in 3-6 months

## 2023-06-01 NOTE — Progress Notes (Signed)
    SUBJECTIVE:   CHIEF COMPLAINT / HPI:   Weight loss  Patient here today to discuss weight loss medication. Over the past 3-4 months, patient has been increasing exercise (walking, active employment) and making healthy diet changes (increased fruit/vegetable intake, reduced sodium) without successful weight loss. She has no known personal or family history of thyroid cancers. No personal history of pancreatitis or gastroparesis.   Prediabetes Patient's most recent HgA1c (6.2) from 05/23/23. Patient has continued to practice healthy lifestyle behaviors including exercise and healthy diet. She also stopped smoking about 3-4 months ago and this has continued to go well.   PERTINENT  PMH / PSH: Prediabetes, pituitary macroadenoma s/p resection  OBJECTIVE:   BP 108/78   Pulse 83   Ht 5\' 8"  (1.727 m)   Wt 256 lb 6.4 oz (116.3 kg)   SpO2 100%   BMI 38.99 kg/m    General: Well-appearing. Resting comfortably in room. CV: Normal S1/S2. No extra heart sounds. Warm and well-perfused. Pulm: Breathing comfortably on room air. CTAB. No increased WOB. Abd: Soft, non-tender, non-distended. Skin:  Warm, dry. Psych: Pleasant and appropriate.    ASSESSMENT/PLAN:   Morbid obesity [facility]) Patient has been practicing healthy lifestyle changes for >90 days without successful weight loss.  - Begin semaglutide [facility]) starting dose 0.25mg  weekly injections x4 weeks - Counseled on continuing healthy habits in conjunction with medication - Patient to be seen in clinic in 4 weeks before advancing to increased dosage  Prediabetes Patient's HgbA1c in prediabetic range (6.2).  - Counseled on continued exercise, healthy diet, and smoking cessation to prevent development of diabetes - Recheck in 3-6 months   Follow up in clinic in 4 weeks before advancing [facility] dosage.  Ivery Quale, MD [facility]

## 2023-06-01 NOTE — Assessment & Plan Note (Addendum)
Patient has been practicing healthy lifestyle changes for >90 days without successful weight loss.  - Begin semaglutide [facility]) starting dose 0.25mg  weekly injections x4 weeks - Counseled on continuing healthy habits in conjunction with medication - Patient to be seen in clinic in 4 weeks before advancing to increased dosage

## 2023-06-01 NOTE — Assessment & Plan Note (Deleted)
Patient has been practicing healthy lifestyle changes for >90 days without successful weight loss.  - Begin semaglutide [facility]) starting dose x4 weeks - Counseled on continuing healthy habits in conjunction with medication - Patient to be seen in clinic in 4 weeks before progressing to increased dosage

## 2023-06-01 NOTE — Patient Instructions (Signed)
Thank you for visiting clinic today - it is always great to see you!  Today we discussed weight loss medication. We have started [facility], which is a weekly injectable to help with weight loss. This medication works best with healthy lifestyle behaviors like exercise and healthy diet, so keep up the great work with that! These healthy habits will also help to prevent developing diabetes.  Please schedule a follow up appointment in 4 weeks so that we can check in on how you're doing. Let us know of any questions or concerns you may have - we are here for you.   Get some good rest! Ivery Quale, MD

## 2023-06-07 ENCOUNTER — Telehealth: Payer: Self-pay

## 2023-06-07 NOTE — Telephone Encounter (Signed)
Pt calling asking about PA for [facility]. Do we have an answer from Medicaid yet? Please call pt with update. Sunday Spillers, CMA

## 2023-06-08 ENCOUNTER — Other Ambulatory Visit (HOSPITAL_COMMUNITY): Payer: Self-pay

## 2023-06-08 ENCOUNTER — Telehealth: Payer: Self-pay

## 2023-06-08 NOTE — Telephone Encounter (Signed)
Pharmacy Patient Advocate Encounter   Received notification from CoverMyMeds that prior authorization for [facility] is required/requested.   Insurance verification completed.   The patient is insured through  [facility] MEDICAID  .   Per test claim: PA required; PA submitted to [facility] via CoverMyMeds Key/confirmation #/[id number]. Status is pending

## 2023-06-10 NOTE — Telephone Encounter (Signed)
Pharmacy Patient Advocate Encounter  Received notification from [facility] that Prior Authorization for [facility] has been APPROVED from 06/08/23 to 12/05/23

## 2023-06-13 NOTE — Telephone Encounter (Signed)
Pharmacy has been updated.   However, they do not have stock of medication.   They will notify her when shipment comes in.

## 2023-06-13 NOTE — Telephone Encounter (Signed)
Pharmacy Patient Advocate Encounter  Received notification from [facility] that Prior Authorization for [facility] has been APPROVED from 06/08/23 to 12/05/23

## 2023-06-22 ENCOUNTER — Encounter (HOSPITAL_COMMUNITY): Payer: Self-pay

## 2023-06-22 ENCOUNTER — Ambulatory Visit (HOSPITAL_COMMUNITY)
Admission: EM | Admit: 2023-06-22 | Discharge: 2023-06-22 | Disposition: A | Discharge status: Home / Self Care | Payer: Medicaid Other | Attending: Internal Medicine | Admitting: Internal Medicine

## 2023-06-22 DIAGNOSIS — R051 Acute cough: Secondary | ICD-10-CM | POA: Diagnosis not present

## 2023-06-22 DIAGNOSIS — J029 Acute pharyngitis, unspecified: Secondary | ICD-10-CM | POA: Insufficient documentation

## 2023-06-22 LAB — POCT RAPID STREP A (OFFICE): Rapid Strep A Screen: NEGATIVE

## 2023-06-22 MED ORDER — PROMETHAZINE-DM 6.25-15 MG/5ML PO SYRP: 5.0000 mL | ORAL_SOLUTION | Freq: Four times a day (QID) | ORAL | 0 refills | Status: DC | PRN

## 2023-06-22 MED ORDER — AMOXICILLIN 500 MG PO CAPS: 1000.0000 mg | ORAL_CAPSULE | Freq: Every day | ORAL | 0 refills | Status: AC

## 2023-06-22 NOTE — ED Triage Notes (Signed)
Pt c/o sore throat, cough, and chest congestion since last night. Taking cough syrup and cough drops with some relief.

## 2023-06-22 NOTE — ED Provider Notes (Signed)
[facility]    CSN: [id number] Arrival date & time: 06/22/23  1142      History   Chief Complaint Chief Complaint  Patient presents with   Sore Throat    HPI Virginia Cardenas is a 37 y.o. female.   Patient presents to clinic for complaints of sore throat, nonproductive cough, and chest congestion that started last night.  She has tried taking cough syrup and cough drops with minimal relief.  Her son is sick with a sore throat, and he frequently drinks after her.  She denies any fevers, any chest pain, wheezing, shortness of breath, nausea or vomiting.  The history is provided by the patient and medical records.  Sore Throat Pertinent negatives include no chest pain, no abdominal pain and no shortness of breath.    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   Deviated septum 01/07/2020   Pituitary tumor 01/07/2020   Cervical cancer screening 03/12/2018    Past Surgical History:  Procedure Laterality Date   CESAREAN SECTION     CRANIOTOMY N/A 01/11/2020   Procedure: ENDOSCOPIC TRANSNASAL TRANSPEHENOIDAL RESECTION OF PITUITARY TUMOR;  Surgeon: Lisbeth Renshaw, MD;  Location: [facility];  Service: Neurosurgery;  Laterality: N/A;  ENDOSCOPIC TRANSNASAL TRANSPEHENOIDAL RESECTION OF PITUITARY TUMOR   CRANIOTOMY N/A 02/25/2023   Procedure: ENDOSCOPIC TRANSPHENOIDAL RESECTION OF PITUITARY TUMOR;  Surgeon: Lisbeth Renshaw, MD;  Location: [facility];  Service: Neurosurgery;  Laterality: N/A;   SINUS ENDO WITH  FUSION N/A 01/11/2020   Procedure: SINUS ENDO WITH FUSION;  Surgeon: Osborn Coho, MD;  Location: [facility] OR;  Service: ENT;  Laterality: N/A;  SINUS ENDO WITH FUSION   TRANSPHENOIDAL APPROACH EXPOSURE N/A 02/25/2023   Procedure: TRANSPHENOIDAL APPROACH EXPOSURE;  Surgeon: Laren Boom, DO;  Location: [facility];  Service: ENT;  Laterality: N/A;    OB History   No obstetric history on file.      Home Medications    Prior to Admission medications   Medication Sig Start Date End Date Taking? Authorizing Provider  amoxicillin (AMOXIL) 500 MG capsule Take 2 capsules (1,000 mg total) by mouth daily for 10 days. 06/22/23 07/02/23 Yes Rinaldo Ratel, [place], FNP  promethazine-dextromethorphan (PROMETHAZINE-DM) 6.25-15 MG/5ML syrup Take 5 mLs by mouth 4 (four) times daily as needed for cough. 06/22/23  Yes Rinaldo Ratel, [place], FNP  hydrocortisone (CORTEF) 10 MG tablet Take 1 tablet (10 mg total) by mouth every morning. Patient not taking: Reported on 05/23/2023 02/27/23   Lisbeth Renshaw, MD  hydrocortisone (CORTEF) 5 MG tablet Take 1 tablet (5 mg total) by mouth every evening. Patient not taking: Reported on 05/23/2023 02/27/23   Lisbeth Renshaw, MD  Levonorgestrel-Eth Estradiol [facility]) 120-30 MCG/24HR PTWK Place 1 patch onto the skin once a week.    [provider]  levothyroxine (SYNTHROID) 25 MCG tablet Take 25 mcg by mouth daily before breakfast.    [provider]  Semaglutide-Weight Management 0.25 MG/0.5ML SOAJ Inject 0.25 mg into the skin once a week for 28 days. 06/01/23 06/29/23  Ivery Quale,  MD  sodium chloride (OCEAN) 0.65 % SOLN nasal spray Place 3 sprays into both nostrils every 4 (four) hours. Patient not taking: Reported on 05/23/2023 02/27/23   Lisbeth Renshaw, MD    Family History Family History  Problem Relation Age of Onset   Cancer Neg Hx    Diabetes Neg Hx    Heart failure Neg Hx    Hyperlipidemia Neg Hx     Social History Social History   Tobacco Use    Smoking status: Every Day    Current packs/day: 0.00    Average packs/day: 0.5 packs/day for 0.5 years (0.2 ttl pk-yrs)    Types: Cigarettes    Start date: 06/2019    Last attempt to quit: 12/2019    Years since quitting: 3.5    Passive exposure: Current   Smokeless tobacco: Never  Vaping Use   Vaping status: Some Days  Substance Use Topics   Alcohol use: Yes    Comment: occ   Drug use: No     Allergies   Latex and Metronidazole   Review of Systems Review of Systems  Constitutional:  Negative for chills.  HENT:  Positive for sore throat. Negative for congestion.   Respiratory:  Positive for cough. Negative for shortness of breath and wheezing.   Cardiovascular:  Negative for chest pain.  Gastrointestinal:  Negative for abdominal pain, diarrhea, nausea and vomiting.     Physical Exam Triage Vital Signs ED Triage Vitals [06/22/23 1317]  Encounter Vitals Group     BP 117/78     Systolic BP Percentile      Diastolic BP Percentile      Pulse Rate 79     Resp 18     Temp 98.2 F (36.8 C)     Temp Source Oral     SpO2 98 %     Weight      Height      Head Circumference      Peak Flow      Pain Score 8     Pain Loc      Pain Education      Exclude from Growth Chart    No data found.  Updated Vital Signs BP 117/78 (BP Location: Right Arm)   Pulse 79   Temp 98.2 F (36.8 C) (Oral)   Resp 18   LMP 06/05/2023   SpO2 98%   Visual Acuity Right Eye Distance:   Left Eye Distance:   Bilateral Distance:    Right Eye Near:   Left Eye Near:    Bilateral Near:     Physical Exam Vitals and nursing note reviewed.  Constitutional:      Appearance: She is well-developed.  HENT:     Head: Normocephalic and atraumatic.     Nose: No congestion or rhinorrhea.     Mouth/Throat:     Mouth: Mucous membranes are moist.     Pharynx: Uvula midline. Posterior oropharyngeal erythema present.     Tonsils: No tonsillar exudate or tonsillar abscesses. 1+ on the right.  1+ on the left.  Cardiovascular:     Rate and Rhythm: Normal rate and regular rhythm.     Heart sounds: Normal heart sounds. No murmur heard. Pulmonary:     Effort: Pulmonary effort is normal. No respiratory distress.     Breath sounds: Normal breath sounds.  Musculoskeletal:     Cervical back: Normal range of motion.  Skin:    General: Skin is warm and dry.  Neurological:     General: No focal deficit present.     Mental Status: She is alert and oriented to person, place, and time.  Psychiatric:        Mood and Affect: Mood normal.        Behavior: Behavior normal.      UC Treatments / Results  Labs (all labs ordered are listed, but only abnormal results are displayed) Labs Reviewed  CULTURE, GROUP A STREP [facility])  POCT RAPID STREP A (OFFICE)    EKG   Radiology No results found.  Procedures Procedures (including critical care time)  Medications Ordered in UC Medications - No data to display  Initial Impression / Assessment and Plan / UC Course  I have reviewed the triage vital signs and the nursing notes.  Pertinent labs & imaging results that were available during my care of the patient were reviewed by me and considered in my medical decision making (see chart for details).  Vitals and triage reviewed, patient is hemodynamically stable.  Lungs are vesicular, heart with regular rate and rhythm.  Posterior pharynx with erythema, no exudate or tonsillar hypertrophy noted.  Rapid strep is negative, will send for culture.  Son with presumed strep throat, will cover with mom with amoxicillin.  Offered COVID food 19 testing for potential viral illness, patient declined.  Symptomatic management discussed.  Plan of care, follow-up care and return precautions given, no questions at this time.    Final Clinical Impressions(s) / UC Diagnoses   Final diagnoses:  Pharyngitis, unspecified etiology  Acute cough     Discharge Instructions      I am treating you with  antibiotics for presumed strep, take all antibiotics as prescribed and until finished, take them with food to help prevent gastrointestinal upset.  You can also use the cough syrup as needed, do not drink or drive on this as it may cause drowsiness.  For any fever, body aches or pains you can alternate between 800 mg of ibuprofen and 500 mg of Tylenol.  Ensure you are staying well-hydrated with at least 64 ounces of fluids daily.  Return to clinic for any new or urgent symptoms.      ED Prescriptions     Medication Sig Dispense Auth. Provider   amoxicillin (AMOXIL) 500 MG capsule Take 2 capsules (1,000 mg total) by mouth daily for 10 days. 20 capsule Rinaldo Ratel, [place], [place]   promethazine-dextromethorphan (PROMETHAZINE-DM) 6.25-15 MG/5ML syrup Take 5 mLs by mouth 4 (four) times daily as needed for cough. 118 mL Bren Borys, [place], [place]      PDMP not reviewed this encounter.   Keagon Glascoe, [place] N, [place] 06/22/23 1344

## 2023-06-22 NOTE — Discharge Instructions (Addendum)
I am treating you with antibiotics for presumed strep, take all antibiotics as prescribed and until finished, take them with food to help prevent gastrointestinal upset.  You can also use the cough syrup as needed, do not drink or drive on this as it may cause drowsiness.  For any fever, body aches or pains you can alternate between 800 mg of ibuprofen and 500 mg of Tylenol.  Ensure you are staying well-hydrated with at least 64 ounces of fluids daily.  Return to clinic for any new or urgent symptoms.

## 2023-06-25 LAB — CULTURE, GROUP A STREP (THRC)

## 2023-06-30 ENCOUNTER — Encounter: Payer: Self-pay | Admitting: Student

## 2023-06-30 ENCOUNTER — Ambulatory Visit (INDEPENDENT_AMBULATORY_CARE_PROVIDER_SITE_OTHER): Payer: Medicaid Other | Admitting: Student

## 2023-06-30 VITALS — BP 110/64 | HR 98 | Wt 252.0 lb

## 2023-06-30 DIAGNOSIS — J309 Allergic rhinitis, unspecified: Secondary | ICD-10-CM | POA: Diagnosis not present

## 2023-06-30 DIAGNOSIS — E669 Obesity, unspecified: Secondary | ICD-10-CM | POA: Diagnosis not present

## 2023-06-30 DIAGNOSIS — D497 Neoplasm of unspecified behavior of endocrine glands and other parts of nervous system: Secondary | ICD-10-CM | POA: Diagnosis not present

## 2023-06-30 DIAGNOSIS — J342 Deviated nasal septum: Secondary | ICD-10-CM | POA: Diagnosis not present

## 2023-06-30 DIAGNOSIS — J343 Hypertrophy of nasal turbinates: Secondary | ICD-10-CM | POA: Diagnosis not present

## 2023-06-30 MED ORDER — SEMAGLUTIDE(0.25 OR 0.5MG/DOS) 2 MG/3ML ~~LOC~~ SOPN: 0.5000 mg | PEN_INJECTOR | SUBCUTANEOUS | 2 refills | Status: DC

## 2023-06-30 NOTE — Patient Instructions (Signed)
It was great to see you! Thank you for allowing me to participate in your care!  I recommend that you always bring your medications to each appointment as this makes it easy to ensure we are on the correct medications and helps [place] not miss when refills are needed.  Our plans for today:  - Weight Management  We are increasing your wegovy to 0.5 mg every week.   If you have GI symptoms with this dose, go back down to 0.25 the next week. We will follow up every month, and can go up in dose, as long as you are tolerating it.   Be sure to eat 3 regular meals a day, with protein and vegetables, can also have some healthy snacks during the day.  Start walking 30 min a day, (walk for 15 min, turn around and walk back)   Take care and seek immediate care sooner if you develop any concerns.   Dr. Bess Kinds, MD [facility]

## 2023-06-30 NOTE — Assessment & Plan Note (Signed)
Patient comes in for follow-up of her weight management.  Patient started on Wegovy at last visit.  Patient reports no symptoms and tolerating medicine well.  Patient would like to increase in dose.  Patient reports decreased appetite, and sometimes forgetting meals.  Discussed nutrition, and healthy meals.  Also encourage patient to start walking regularly. - Increase Wegovy to 0.5 mg q. weekly - Follow-up 1 month - Encouraged diet and exercise

## 2023-06-30 NOTE — Progress Notes (Signed)
  SUBJECTIVE:   CHIEF COMPLAINT / HPI:   F/u obesity -Seen 8/14 for obesity w/ good exercise and diet, started on wegovy 0.25 mg q wk  Today: Notes that eating is going okay, is tolerating the wegovy well. Had a little nausea at first, but has since subsided. Is noting decreased appetite and eating less. Is wanting to go up in dose.   PERTINENT  PMH / PSH:    OBJECTIVE:  BP 110/64   Pulse 98   Wt 252 lb (114.3 kg)   LMP 06/02/2023   SpO2 97%   BMI 38.32 kg/m  Physical Exam Constitutional:      Appearance: Normal appearance.  Abdominal:     General: There is no distension.     Palpations: Abdomen is soft. There is no mass.     Tenderness: There is no abdominal tenderness. There is no guarding or rebound.     Hernia: No hernia is present.  Neurological:     Mental Status: She is alert.      ASSESSMENT/PLAN:  Obesity (BMI 35.0-39.9 without comorbidity) Assessment & Plan: Patient comes in for follow-up of her weight management.  Patient started on Wegovy at last visit.  Patient reports no symptoms and tolerating medicine well.  Patient would like to increase in dose.  Patient reports decreased appetite, and sometimes forgetting meals.  Discussed nutrition, and healthy meals.  Also encourage patient to start walking regularly. - Increase Wegovy to 0.5 mg q. weekly - Follow-up 1 month - Encouraged diet and exercise   Other orders -     Semaglutide(0.25 or 0.5MG /DOS); Inject 0.5 mg into the skin once a week.  Dispense: 3 mL; Refill: 2   No follow-ups on file. Bess Kinds, MD 06/30/2023, 2:18 PM PGY-3, [facility]

## 2023-07-04 ENCOUNTER — Telehealth: Payer: Self-pay

## 2023-07-04 NOTE — Telephone Encounter (Signed)
Rec'd PA request for patients ozempic.   Unable to do PA request without dx of diabetes.   Pt previously approved for [facility].

## 2023-07-06 ENCOUNTER — Other Ambulatory Visit: Payer: Self-pay | Admitting: Student

## 2023-07-06 MED ORDER — WEGOVY 0.5 MG/0.5ML ~~LOC~~ SOAJ: 0.5000 mg | SUBCUTANEOUS | 2 refills | Status: DC

## 2023-07-06 NOTE — Telephone Encounter (Signed)
Virginia Cardenas!  I may have an issue getting this filled. When I saw the patient, the Reginal Lutes was not in the meds list, so I attempted to add semaglutide to their meds and it came back like this. I'm attempting to refill the Rx with a "wegovy" rx specifically. Hopefully this goes through.   Please let me know if there is anything else I need to do!  Thank you,  -BS

## 2023-07-11 ENCOUNTER — Telehealth: Payer: Self-pay

## 2023-07-11 NOTE — Telephone Encounter (Signed)
Medicaid Managed Care   Unsuccessful Outreach Note  07/11/2023 Name: Virginia Cardenas MRN: [id number] DOB: 08-19-1986  Referred by: Bess Kinds, MD Reason for referral : No chief complaint on file.   An unsuccessful telephone outreach was attempted today. The patient was referred to the case management team for assistance with care management and care coordination.   Follow Up Plan: If patient returns call to provider office, please advise to call Embedded Care Management Care Guide Nicholes Rough* at [phone]*  Nicholes Rough, CMA Care Guide [employer]

## 2023-08-26 ENCOUNTER — Encounter: Payer: Self-pay | Admitting: Student

## 2023-08-26 ENCOUNTER — Ambulatory Visit (INDEPENDENT_AMBULATORY_CARE_PROVIDER_SITE_OTHER): Payer: Medicaid Other | Admitting: Student

## 2023-08-26 VITALS — BP 99/47 | HR 81 | Ht 65.0 in | Wt 246.8 lb

## 2023-08-26 DIAGNOSIS — E669 Obesity, unspecified: Secondary | ICD-10-CM | POA: Diagnosis not present

## 2023-08-26 MED ORDER — WEGOVY 1 MG/0.5ML ~~LOC~~ SOAJ: 1.0000 mg | SUBCUTANEOUS | 1 refills | Status: DC

## 2023-08-26 NOTE — Progress Notes (Signed)
  SUBJECTIVE:   CHIEF COMPLAINT / HPI:   Weight loss -Seen 9/12 for f/u on Wegovy, incr to 0.5 mg q wk  Today: Is tolerating medication well.  No GI side effects.  Has lost nearly 20 pounds.  He is hoping to get to the gym more frequently to workout and shape up her body.  Is happy with results and would like to increase dose.  PERTINENT  PMH / PSH:   OBJECTIVE:  There were no vitals taken for this visit. Physical Exam Constitutional:      General: She is not in acute distress.    Appearance: Normal appearance. She is not ill-appearing.  Pulmonary:     Effort: Pulmonary effort is normal.  Neurological:     Mental Status: She is alert.     Gait: Gait is intact.  Psychiatric:        Mood and Affect: Mood normal.        Behavior: Behavior normal.      ASSESSMENT/PLAN:   Assessment & Plan Obesity (BMI 35.0-39.9 without comorbidity) Patient comes in for follow-up.  Patient appreciates she has been taking [place] weekly.  Patient denies any GI symptoms.  Patient appreciates she is lost nearly 20 pounds, and is feeling good.  Patient wants to increase dose.  Plan to increase dose to 1 mg weekly.  Plan for follow-up in 2 months, patient to message provider in 1 month about increasing dose. - Wegovy 1 mg weekly - Patient to consider advancing to 1.7 mg weekly, after month, if tolerating well No follow-ups on file. Bess Kinds, MD 08/26/2023, 12:32 PM PGY-3, [facility]

## 2023-08-26 NOTE — Assessment & Plan Note (Addendum)
Patient comes in for follow-up.  Patient appreciates she has been taking [place] weekly.  Patient denies any GI symptoms.  Patient appreciates she is lost nearly 20 pounds, and is feeling good.  Patient wants to increase dose.  Plan to increase dose to 1 mg weekly.  Plan for follow-up in 2 months, patient to message provider in 1 month about increasing dose. - Wegovy 1 mg weekly - Patient to consider advancing to 1.7 mg weekly, after month, if tolerating well

## 2023-08-26 NOTE — Patient Instructions (Signed)
It was great to see you! Thank you for allowing me to participate in your care!  Our plans for today:  - Increase dose to 1.0 mg weekly  After 4 weeks can increase dose to 1.7 mg weekly, if tolerating well  Take care and seek immediate care sooner if you develop any concerns.   Dr. Bess Kinds, MD [facility]

## 2023-09-14 ENCOUNTER — Ambulatory Visit: Payer: Medicaid Other | Admitting: Endocrinology

## 2023-10-27 ENCOUNTER — Other Ambulatory Visit: Payer: Self-pay | Admitting: Student

## 2023-11-04 ENCOUNTER — Ambulatory Visit (INDEPENDENT_AMBULATORY_CARE_PROVIDER_SITE_OTHER): Payer: Medicaid Other | Admitting: Student

## 2023-11-04 ENCOUNTER — Encounter: Payer: Self-pay | Admitting: Student

## 2023-11-04 ENCOUNTER — Other Ambulatory Visit (HOSPITAL_COMMUNITY)
Admission: RE | Admit: 2023-11-04 | Discharge: 2023-11-04 | Disposition: A | Discharge status: Home / Self Care | Payer: Medicaid Other | Source: Ambulatory Visit | Attending: Family Medicine | Admitting: Family Medicine

## 2023-11-04 VITALS — BP 99/76 | HR 84 | Ht 65.0 in | Wt 247.0 lb

## 2023-11-04 DIAGNOSIS — Z0000 Encounter for general adult medical examination without abnormal findings: Secondary | ICD-10-CM | POA: Diagnosis not present

## 2023-11-04 DIAGNOSIS — Z113 Encounter for screening for infections with a predominantly sexual mode of transmission: Secondary | ICD-10-CM | POA: Diagnosis not present

## 2023-11-04 DIAGNOSIS — Z13220 Encounter for screening for lipoid disorders: Secondary | ICD-10-CM | POA: Diagnosis not present

## 2023-11-04 DIAGNOSIS — E669 Obesity, unspecified: Secondary | ICD-10-CM

## 2023-11-04 NOTE — Patient Instructions (Addendum)
It was great to see you! Thank you for allowing me to participate in your care!  I recommend that you always bring your medications to each appointment as this makes it easy to ensure we are on the correct medications and helps [place] not miss when refills are needed.  Our plans for today:  - Wegovy  Can increase to 1.7 mg every week  Follow up 1 month  - Sexually Transmitted Infection screen  Testing you for sexually transmitted infections. I will call you if the results are positive.   - Check Up  Checking your cholesterol levels today  You are due for a pneumonia and Covid vaccine  We are checking some labs today, I will call you if they are abnormal will send you a MyChart message or a letter if they are normal.  If you do not hear about your labs in the next 2 weeks please let us know.  Take care and seek immediate care sooner if you develop any concerns.   Dr. Bess Kinds, MD [facility]

## 2023-11-04 NOTE — Assessment & Plan Note (Addendum)
Patient comes in for annual physical.  Patient has no complaints, but patient request STI testing, for routine purposes (vaginal, and oral).  Patient denies any symptoms, uses protection, is sexually active with 1 partner, and is on the patch for birth control.  Will also test patient's cholesterol.  Patient previously had A1c checked last August, and was 6.2.  Patient also up-to-date on cervical cancer screenings.  Will recommend patient follow-up as needed. - Follow-up 1 year, sooner if needed

## 2023-11-04 NOTE — Assessment & Plan Note (Signed)
Wanting to increase dose of Wegovy, tolerating it well. Will increase w/ f/u in 1 month -increase to 1.7 mg q wk, x 4 weeks. -F/u 1 month

## 2023-11-04 NOTE — Progress Notes (Cosign Needed Addendum)
    SUBJECTIVE:   Chief compliant/HPI: annual examination  Virginia Cardenas is a 38 y.o. who presents today for an annual exam.    Updated history tabs and problem list .   OBJECTIVE:   BP 99/76   Pulse 84   Ht 5\' 5"  (1.651 m)   Wt 247 lb (112 kg)   LMP 10/24/2023   SpO2 99%   BMI 41.10 kg/m   Physical Exam Exam conducted with a chaperone present.  Constitutional:      Appearance: Normal appearance.  Cardiovascular:     Rate and Rhythm: Normal rate and regular rhythm.     Pulses: Normal pulses.     Heart sounds: Normal heart sounds. No murmur heard.    No friction rub. No gallop.  Pulmonary:     Effort: Pulmonary effort is normal. No respiratory distress.     Breath sounds: Normal breath sounds. No stridor. No wheezing, rhonchi or rales.  Abdominal:     General: Bowel sounds are normal. There is no distension.     Palpations: Abdomen is soft. There is no mass.     Tenderness: There is no abdominal tenderness. There is no guarding or rebound.     Hernia: No hernia is present.  Genitourinary:    Vagina: Normal. No signs of injury and foreign body. No vaginal discharge, erythema, tenderness, bleeding or lesions.  Neurological:     Mental Status: She is alert.      ASSESSMENT/PLAN:   Obesity (BMI 35.0-39.9 without comorbidity) Wanting to increase dose of Wegovy, tolerating it well. Will increase w/ f/u in 1 month -increase to 1.7 mg q wk, x 4 weeks. -F/u 1 month  Annual physical exam Patient comes in for annual physical.  Patient has no complaints, but patient request STI testing, for routine purposes (vaginal, and oral).  Patient denies any symptoms, uses protection, is sexually active with 1 partner, and is on the patch for birth control.  Will also test patient's cholesterol.  Patient previously had A1c checked last August, and was 6.2.  Patient also up-to-date on cervical cancer screenings.  Will recommend patient follow-up as needed. - Follow-up 1 year, sooner  if needed    Annual Examination  See AVS for age appropriate recommendations.   PHQ score not completed but in good mood, no concerns for depression. Blood pressure reviewed and at goal.  Asked about intimate partner violence and patient reports no concerns.  The patient currently uses patch for contraception. Folate recommended as appropriate, minimum of 400 mcg per day.   Considered the following items based upon USPSTF recommendations: HIV testing: discussed and ordered  Hepatitis C: discussed Neg 03/06/21 Hepatitis B: discussed Neg 03/06/21 Syphilis if at high risk: discussed and ordered GC/CT at high risk, ordered.  Lipid panel (nonfasting or fasting) discussed based upon AHA recommendations and ordered.  Consider repeat every 4-6 years.  Reviewed risk factors for latent tuberculosis and not indicated  Discussed family history, BRCA testing not indicated. Tool used to risk stratify was Pedigree Assessment tool   Cervical cancer screening: prior Pap reviewed, repeat due in 3 years Immunizations declines covid and pneumonia vacciine.    Follow up in 1   year or sooner if indicated.    Bess Kinds, MD [facility]

## 2023-11-05 LAB — RPR W/REFLEX TO TREPSURE

## 2023-11-07 LAB — LIPID PANEL
Chol/HDL Ratio: 4.8 {ratio} — ABNORMAL HIGH (ref 0.0–4.4)
Cholesterol, Total: 177 mg/dL (ref 100–199)
HDL: 37 mg/dL — ABNORMAL LOW (ref >39)
LDL Chol Calc (NIH): 122 mg/dL — ABNORMAL HIGH (ref 0–99)
Triglycerides: 97 mg/dL (ref 0–149)
VLDL Cholesterol Cal: 18 mg/dL (ref 5–40)

## 2023-11-07 LAB — TREPONEMAL ANTIBODIES, TPPA: Treponemal Antibodies, TPPA: NONREACTIVE

## 2023-11-07 LAB — HIV ANTIBODY (ROUTINE TESTING W REFLEX): HIV Screen 4th Generation wRfx: NONREACTIVE

## 2023-11-07 LAB — RPR W/REFLEX TO TREPSURE

## 2023-11-08 LAB — CERVICOVAGINAL ANCILLARY ONLY
Bacterial Vaginitis (gardnerella): POSITIVE — AB
Candida Glabrata: NEGATIVE
Candida Vaginitis: NEGATIVE
Chlamydia: NEGATIVE
Chlamydia: NEGATIVE
Comment: NEGATIVE
Comment: NEGATIVE
Comment: NEGATIVE
Comment: NEGATIVE
Comment: NEGATIVE
Comment: NORMAL
Comment: NEGATIVE
Comment: NORMAL
Neisseria Gonorrhea: NEGATIVE
Neisseria Gonorrhea: NEGATIVE
Trichomonas: NEGATIVE

## 2023-11-09 ENCOUNTER — Telehealth: Payer: Self-pay | Admitting: Student

## 2023-11-09 MED ORDER — CLINDAMYCIN HCL 300 MG PO CAPS: 300.0000 mg | ORAL_CAPSULE | Freq: Two times a day (BID) | ORAL | 0 refills | Status: AC

## 2023-11-09 NOTE — Telephone Encounter (Signed)
Patient called and results discussed. As well as causes of BV. Will recommend patient also adjust diet for cholesterol level, but no statin needed at this time.   Rx for BV sent, metronidazole allergy noted thus clindamycin BID x 7 days

## 2023-11-14 ENCOUNTER — Encounter (INDEPENDENT_AMBULATORY_CARE_PROVIDER_SITE_OTHER): Payer: Medicaid Other | Admitting: Family Medicine

## 2023-11-23 ENCOUNTER — Encounter (INDEPENDENT_AMBULATORY_CARE_PROVIDER_SITE_OTHER): Payer: Medicaid Other | Admitting: Family Medicine

## 2023-11-23 ENCOUNTER — Ambulatory Visit (INDEPENDENT_AMBULATORY_CARE_PROVIDER_SITE_OTHER): Payer: Medicaid Other | Admitting: Internal Medicine

## 2023-11-23 ENCOUNTER — Encounter (INDEPENDENT_AMBULATORY_CARE_PROVIDER_SITE_OTHER): Payer: Self-pay | Admitting: Internal Medicine

## 2023-11-23 VITALS — BP 115/81 | HR 78 | Temp 98.4°F | Ht 66.0 in | Wt 240.0 lb

## 2023-11-23 DIAGNOSIS — E893 Postprocedural hypopituitarism: Secondary | ICD-10-CM | POA: Diagnosis not present

## 2023-11-23 DIAGNOSIS — R7303 Prediabetes: Secondary | ICD-10-CM

## 2023-11-23 DIAGNOSIS — E7800 Pure hypercholesterolemia, unspecified: Secondary | ICD-10-CM

## 2023-11-23 DIAGNOSIS — Z6839 Body mass index (BMI) 39.0-39.9, adult: Secondary | ICD-10-CM | POA: Diagnosis not present

## 2023-11-23 DIAGNOSIS — E66812 Obesity, class 2: Secondary | ICD-10-CM

## 2023-11-23 NOTE — Progress Notes (Signed)
 Office: [phone]  /  Fax: [phone]   Initial Visit  Virginia Cardenas was seen in clinic today to evaluate for obesity. She is interested in losing weight to improve overall health and reduce the risk of weight related complications. She presents today to review program treatment options, initial physical assessment, and evaluation.     She was referred by: Friend or Family  When asked what else they would like to accomplish? She states: Adopt healthier eating patterns, Improve energy levels and physical activity, Improve existing medical conditions, Improve quality of life, Improve appearance, and Improve self-confidence  Weight history: started after having her two children  When asked how has your weight affected you? She states: Has affected self-esteem, Contributed to medical problems, Having fatigue, Having poor endurance, and Has affected mood   Some associated conditions: Hyperlipidemia, Prediabetes, GERD, and Other: Suspected sleep apnea  Contributing factors: Family history of obesity, Disruption of circadian rhythm / sleep disordered breathing, Consumption of processed foods, Moderate to high levels of stress, Eating patterns, Mental health problems, and Strong orexigenic signaling and inadequate inhibitory control . Skips meals.   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  Peak Weight:260 , Body Fat%:45, Visceral Fat Rating:11, Weight trend over the last 12 months: Decreasing  General:  Alert, oriented and cooperative. Patient is in no acute distress.  Respiratory: Normal respiratory effort, no problems with respiration noted   Gait: able to ambulate independently  Mental Status: Normal mood and affect. Normal behavior. Normal judgment and thought content.   DIAGNOSTIC DATA REVIEWED:  BMET    Component Value Date/Time   NA 138 02/26/2023 0637   NA 142 03/09/2018 1604   K 4.9 02/26/2023 0637   CL 103 02/26/2023 0637   CO2 25 02/26/2023 0637   GLUCOSE 111 (H) 02/26/2023 0637   BUN 6 02/26/2023 0637   BUN 7 03/09/2018 1604   CREATININE 0.70 02/26/2023 0637   CALCIUM 8.5 (L) 02/26/2023 0637   GFRNONAA >60 02/26/2023 0637   GFRAA >60 01/15/2020 0216   Lab Results  Component Value Date   HGBA1C 6.2 (H) 05/23/2023   No results found for: INSULIN  CBC    Component Value Date/Time   WBC 7.0 02/25/2023 1227   RBC 4.34 02/25/2023 1227   HGB 12.7 02/25/2023 1227   HCT 37.9 02/25/2023 1227   PLT 346 02/25/2023 1227   MCV 87.3 02/25/2023 1227   MCH 29.3 02/25/2023 1227   MCHC 33.5 02/25/2023 1227   RDW 15.3 02/25/2023 1227   Iron/TIBC/Ferritin/ %Sat No results found for: IRON, TIBC, FERRITIN, IRONPCTSAT Lipid Panel     Component Value Date/Time   CHOL 177 11/04/2023 1644   TRIG 97 11/04/2023 1644   HDL 37 (L) 11/04/2023  1644   CHOLHDL 4.8 (H) 11/04/2023 1644   LDLCALC 122 (H) 11/04/2023 1644   Hepatic Function Panel     Component Value Date/Time   PROT 7.6 03/09/2018 1604   ALBUMIN  3.7 01/15/2020 0216   ALBUMIN  4.5 03/09/2018 1604   AST 25 03/09/2018 1604   ALT 14 03/09/2018 1604   ALKPHOS 68 03/09/2018 1604   BILITOT 0.3 03/09/2018 1604   BILIDIR <0.1 06/18/2008 0009   IBILI NOT CALCULATED 06/18/2008 0009      Component Value Date/Time   TSH 2.007 02/27/2023 1432    TSH 1.060 02/15/2022 0942     Assessment and Plan:    Class 2 severe obesity with serious comorbidity and body mass index (BMI) of 39.0 to 39.9 in adult, unspecified obesity type [facility]) Assessment & Plan: Venia has a history of macroadenoma s/p resection.  She is followed by endocrinology and states having stable hormones.  She is no longer on hydrocortisone .  She is currently on Wegovy  1 mg once a week and medication has been adjusted by her primary care team.  Unfortunately she is developed a restrictive eating pattern secondary to appetite suppression.  She has been skipping meals not getting enough protein this is the reason why her weight loss has plateaued this will likely pick up with nutritional and behavioral changes.  Specifics to follow at intake appointment.  We reviewed anthropometrics, biometrics, associated medical conditions and contributing factors with patient. she would benefit from a medically tailored reduced calorie nutrional plan based on her REE (resting energy expenditure), which will be determined by indirect calorimetry.  We will also assess for cardiometabolic risk and nutritional derangements via fasting labs at intake appointment.     Prediabetes Assessment & Plan: Most recent A1c is  Lab Results  Component Value Date   HGBA1C 6.2 (H) 05/23/2023    Patient aware of disease state and risk of progression. This may contribute to abnormal cravings, fatigue and diabetic complications without having diabetes.   She may benefit from a diet moderate in protein and low glycemic load.  We will check fasting serologies and implement nutritional plan at her intake appointment.  She is current only on Wegovy  for pharmacoprophylaxis and will continue medication.    Status post transsphenoidal pituitary resection [facility]) Assessment & Plan: Patient is followed by endocrinology.  She is no longer on hormonal replacement therapy.  Continue monitoring may affect weight loss  depending on hormones affected.  According to patient her hormones are normal.   Pure hypercholesterolemia Assessment & Plan: LDL is not at goal. Elevated LDL may be secondary to nutrition, genetics and spillover effect from excess adiposity. Recommended LDL goal is <70 to reduce the risk of fatty streaks and the progression to obstructive ASCVD in the future.     Lab Results  Component Value Date   CHOL 177 11/04/2023   HDL 37 (L) 11/04/2023   LDLCALC 122 (H) 11/04/2023   TRIG 97 11/04/2023   CHOLHDL 4.8 (H) 11/04/2023    Continue weight loss therapy, losing 10% or more of body weight may improve condition. Also advised to reduce saturated fats in diet to less than 10% of daily calories.              Obesity Treatment / Action Plan:  Patient will work on garnering support from family and friends to begin weight loss journey. Will work on eliminating or reducing the presence of highly palatable, calorie dense foods in the home. Will complete provided nutritional  and psychosocial assessment questionnaire before the next appointment. Will be scheduled for indirect calorimetry to determine resting energy expenditure in a fasting state.  This will allow us  to create a reduced calorie, high-protein meal plan to promote loss of fat mass while preserving muscle mass. Counseled on the health benefits of losing 5%-15% of total body weight. Was counseled on nutritional approaches to weight loss and benefits of reducing processed foods and consuming plant-based foods and high quality protein as part of nutritional weight management. Was counseled on pharmacotherapy and role as an adjunct in weight management.   Obesity Education Performed Today:  She was weighed on the bioimpedance scale and results were discussed and documented in the synopsis.  We discussed obesity as a disease and the importance of a more detailed evaluation of all the factors contributing to the disease.  We  discussed the importance of long term lifestyle changes which include nutrition, exercise and behavioral modifications as well as the importance of customizing this to her specific health and social needs.  We discussed the benefits of reaching a healthier weight to alleviate the symptoms of existing conditions and reduce the risks of the biomechanical, metabolic and psychological effects of obesity.  Neville L Moultrie appears to be in the action stage of change and states they are ready to start intensive lifestyle modifications and behavioral modifications.  I have spent 40 minutes in the care of the patient today including: preparing to see patient (e.g. review and interpretation of tests, old notes ), obtaining and/or reviewing separately obtained history, performing a medically appropriate examination or evaluation, counseling and educating the patient, documenting clinical information in the electronic or other health care record, and independently interpreting results and communicating results to the patient, family, or caregiver     Lucas Parker, MD

## 2023-11-23 NOTE — Assessment & Plan Note (Signed)
 Most recent A1c is  Lab Results  Component Value Date   HGBA1C 6.2 (H) 05/23/2023    Patient aware of disease state and risk of progression. This may contribute to abnormal cravings, fatigue and diabetic complications without having diabetes.   She may benefit from a diet moderate in protein and low glycemic load.  We will check fasting serologies and implement nutritional plan at her intake appointment.  She is current only on Wegovy  for pharmacoprophylaxis and will continue medication.

## 2023-11-23 NOTE — Assessment & Plan Note (Signed)
 LDL is not at goal. Elevated LDL may be secondary to nutrition, genetics and spillover effect from excess adiposity. Recommended LDL goal is <70 to reduce the risk of fatty streaks and the progression to obstructive ASCVD in the future.     Lab Results  Component Value Date   CHOL 177 11/04/2023   HDL 37 (L) 11/04/2023   LDLCALC 122 (H) 11/04/2023   TRIG 97 11/04/2023   CHOLHDL 4.8 (H) 11/04/2023    Continue weight loss therapy, losing 10% or more of body weight may improve condition. Also advised to reduce saturated fats in diet to less than 10% of daily calories.

## 2023-11-23 NOTE — Assessment & Plan Note (Signed)
 Patient is followed by endocrinology.  She is no longer on hormonal replacement therapy.  Continue monitoring may affect weight loss depending on hormones affected.  According to patient her hormones are normal.

## 2023-11-23 NOTE — Assessment & Plan Note (Signed)
 Virginia Cardenas has a history of macroadenoma s/p resection.  She is followed by endocrinology and states having stable hormones.  She is no longer on hydrocortisone .  She is currently on Wegovy  1 mg once a week and medication has been adjusted by her primary care team.  Unfortunately she is developed a restrictive eating pattern secondary to appetite suppression.  She has been skipping meals not getting enough protein this is the reason why her weight loss has plateaued this will likely pick up with nutritional and behavioral changes.  Specifics to follow at intake appointment.  We reviewed anthropometrics, biometrics, associated medical conditions and contributing factors with patient. she would benefit from a medically tailored reduced calorie nutrional plan based on her REE (resting energy expenditure), which will be determined by indirect calorimetry.  We will also assess for cardiometabolic risk and nutritional derangements via fasting labs at intake appointment.

## 2023-11-27 ENCOUNTER — Other Ambulatory Visit: Payer: Self-pay | Admitting: Student

## 2023-12-01 ENCOUNTER — Telehealth: Payer: Self-pay

## 2023-12-01 NOTE — Telephone Encounter (Signed)
Patient calls nurse line in regards to [facility] prescription.   She reports she discussed with PCP the desire to increase to 1.7mg  weekly.   However, the pen that was sent in was for the 1mg . The 1mg  pen will not increase to 1.7mg  dose.  Please send in [facility] 1.7mg .   Will forward to PCP.

## 2023-12-02 ENCOUNTER — Other Ambulatory Visit: Payer: Self-pay | Admitting: Student

## 2023-12-02 MED ORDER — WEGOVY 1.7 MG/0.75ML ~~LOC~~ SOAJ: 1.7000 mg | SUBCUTANEOUS | 2 refills | Status: DC

## 2023-12-02 NOTE — Telephone Encounter (Signed)
Rx sent in

## 2023-12-02 NOTE — Progress Notes (Signed)
Change in therapy to higher dose wegovy, 1.7 mg q week

## 2023-12-09 ENCOUNTER — Other Ambulatory Visit (HOSPITAL_COMMUNITY): Payer: Self-pay | Admitting: Neurosurgery

## 2023-12-09 DIAGNOSIS — D352 Benign neoplasm of pituitary gland: Secondary | ICD-10-CM

## 2023-12-21 ENCOUNTER — Encounter (HOSPITAL_COMMUNITY): Payer: Self-pay

## 2023-12-21 ENCOUNTER — Ambulatory Visit (HOSPITAL_COMMUNITY): Payer: Medicaid Other

## 2023-12-26 ENCOUNTER — Ambulatory Visit (HOSPITAL_COMMUNITY)
Admission: RE | Admit: 2023-12-26 | Discharge: 2023-12-26 | Disposition: A | Discharge status: Home / Self Care | Source: Ambulatory Visit | Attending: Neurosurgery | Admitting: Neurosurgery

## 2023-12-26 DIAGNOSIS — R220 Localized swelling, mass and lump, head: Secondary | ICD-10-CM | POA: Diagnosis not present

## 2023-12-26 DIAGNOSIS — E236 Other disorders of pituitary gland: Secondary | ICD-10-CM | POA: Diagnosis not present

## 2023-12-26 DIAGNOSIS — D352 Benign neoplasm of pituitary gland: Secondary | ICD-10-CM | POA: Insufficient documentation

## 2023-12-26 MED ORDER — GADOBUTROL 1 MMOL/ML IV SOLN
7.5000 mL | Freq: Once | INTRAVENOUS | Status: AC | PRN
  Administered 2023-12-26: 7.5 mL via INTRAVENOUS

## 2023-12-30 ENCOUNTER — Telehealth: Payer: Self-pay

## 2023-12-30 NOTE — Telephone Encounter (Signed)
 Patient calls nurse line in regards to [facility]   Dose change to 1.7mg  last month, she reports this needs a prior authorization.   Advised will forward to pharmacy team.

## 2024-01-02 ENCOUNTER — Telehealth: Payer: Self-pay

## 2024-01-02 NOTE — Telephone Encounter (Signed)
 See other telephone note.  PA started.

## 2024-01-02 NOTE — Telephone Encounter (Signed)
 Pharmacy Patient Advocate Encounter   Received notification from CoverMyMeds that prior authorization for [facility] 1.7MG  is required/requested.   Insurance verification completed.   The patient is insured through [facility] .   PA required; PA submitted to above mentioned insurance via CoverMyMeds Key/confirmation #/EOC [id number]. Status is pending

## 2024-01-03 NOTE — Telephone Encounter (Signed)
 Pharmacy Patient Advocate Encounter  Received notification from [facility] that Prior Authorization for [facility] 1.7MG  has been APPROVED from 01/03/24 to 01/02/25   PA #/Case ID/Reference #: [id number]

## 2024-01-06 ENCOUNTER — Encounter (HOSPITAL_COMMUNITY): Payer: Self-pay | Admitting: Emergency Medicine

## 2024-01-06 ENCOUNTER — Ambulatory Visit (HOSPITAL_COMMUNITY): Admission: EM | Admit: 2024-01-06 | Discharge: 2024-01-06 | Disposition: A | Discharge status: Home / Self Care

## 2024-01-06 DIAGNOSIS — U071 COVID-19: Secondary | ICD-10-CM | POA: Diagnosis not present

## 2024-01-06 NOTE — ED Triage Notes (Signed)
 Pt reports 2-3 days ago feeling fatigued. Yesterday at work took covid test and was positive. Reports nothing taste right when eats.

## 2024-01-06 NOTE — Discharge Instructions (Addendum)
 Test taken yesterday at work was positive for COVID

## 2024-01-06 NOTE — ED Provider Notes (Signed)
 [facility]    CSN: [id number] Arrival date & time: 01/06/24  1200      History   Chief Complaint Chief Complaint  Patient presents with   Covid Positive    HPI Virginia Cardenas is a 38 y.o. female.  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   Pituitary mass (HCC) 11/28/2019   Pituitary tumor 01/11/2020    Patient Active Problem List   Diagnosis Date Noted   Pure hypercholesterolemia 11/23/2023   Annual physical exam 11/04/2023   Obesity (BMI 35.0-39.9 without comorbidity) 06/30/2023   Morbid obesity (HCC) 06/01/2023   Prediabetes 06/01/2023   Enlarged thyroid 05/23/2023   Pituitary macroadenoma (HCC) 02/25/2023   Acute bilateral low back pain without sciatica 09/01/2022   Status post transsphenoidal pituitary resection (HCC) 01/17/2020   History of pituitary adenoma 01/07/2020   Deviated septum 01/07/2020   Cervical cancer screening 03/12/2018   Class 2 severe obesity with serious comorbidity and body mass index (BMI) of 39.0 to 39.9 in adult (HCC) 03/12/2018    Past Surgical History:  Procedure Laterality Date   CESAREAN SECTION     CRANIOTOMY N/A 01/11/2020   Procedure: ENDOSCOPIC TRANSNASAL TRANSPEHENOIDAL RESECTION OF PITUITARY TUMOR;  Surgeon: Lisbeth Renshaw, MD;  Location: [facility] OR;  Service: Neurosurgery;  Laterality: N/A;  ENDOSCOPIC TRANSNASAL TRANSPEHENOIDAL RESECTION OF PITUITARY TUMOR   CRANIOTOMY N/A 02/25/2023   Procedure: ENDOSCOPIC TRANSPHENOIDAL RESECTION OF PITUITARY TUMOR;  Surgeon: Lisbeth Renshaw, MD;  Location: [facility] OR;  Service: Neurosurgery;  Laterality: N/A;   SINUS ENDO WITH FUSION N/A 01/11/2020   Procedure: SINUS ENDO WITH FUSION;  Surgeon: Osborn Coho, MD;  Location: [facility] OR;  Service:  ENT;  Laterality: N/A;  SINUS ENDO WITH FUSION   TRANSPHENOIDAL APPROACH EXPOSURE N/A 02/25/2023   Procedure: TRANSPHENOIDAL APPROACH EXPOSURE;  Surgeon: Laren Boom, DO;  Location: [facility] OR;  Service: ENT;  Laterality: N/A;    OB History   No obstetric history on file.      Home Medications    Prior to Admission medications   Medication Sig Start Date End Date Taking? Authorizing Provider  Levonorgestrel-Eth Estradiol (TWIRLA) 120-30 MCG/24HR PTWK Place 1 patch onto the skin once a week.    [provider]  Semaglutide-Weight Management (WEGOVY) 1.7 MG/0.75ML SOAJ Inject 1.7 mg into the skin once a week. 12/02/23   Bess Kinds, MD    Family History Family History  Problem Relation Age of Onset   Cancer Neg Hx    Diabetes Neg Hx    Heart failure Neg Hx    Hyperlipidemia Neg Hx     Social History Social History   Tobacco Use   Smoking status: Every Day    Current packs/day: 0.00    Average packs/day: 0.5 packs/day for 0.5 years (0.2 ttl pk-yrs)    Types: Cigarettes    Start date: 06/2019    Last attempt to quit: 12/2019    Years since quitting: 4.0    Passive exposure: Current   Smokeless tobacco: Never  Vaping Use   Vaping status: Some Days  Substance Use Topics   Alcohol use: Yes    Comment: occ   Drug use: No     Allergies   Latex and Metronidazole  Review of Systems Review of Systems Per HPI  Physical Exam Triage Vital Signs ED Triage Vitals [01/06/24 1243]  Encounter Vitals Group     BP      Systolic BP Percentile      Diastolic BP Percentile      Pulse      Resp      Temp      Temp src      SpO2      Weight      Height      Head Circumference      Peak Flow      Pain Score 6     Pain Loc      Pain Education      Exclude from Growth Chart    No data found.  Updated Vital Signs BP 108/84 (BP Location: Left Arm)   Pulse 76   Temp 97.9 F (36.6 C) (Oral)   Resp 17   LMP 01/03/2024   SpO2 98%    Physical  Exam Vitals and nursing note reviewed.  Constitutional:      Appearance: She is not ill-appearing.  HENT:     Nose: No congestion or rhinorrhea.     Mouth/Throat:     Mouth: Mucous membranes are moist.     Pharynx: Oropharynx is clear. No posterior oropharyngeal erythema.  Eyes:     Conjunctiva/sclera: Conjunctivae normal.  Cardiovascular:     Rate and Rhythm: Normal rate and regular rhythm.     Pulses: Normal pulses.     Heart sounds: Normal heart sounds.  Pulmonary:     Effort: Pulmonary effort is normal.     Breath sounds: Normal breath sounds.  Musculoskeletal:     Cervical back: Normal range of motion. No rigidity or tenderness.  Lymphadenopathy:     Cervical: No cervical adenopathy.  Skin:    General: Skin is warm and dry.  Neurological:     Mental Status: She is alert and oriented to person, place, and time.     UC Treatments / Results  Labs (all labs ordered are listed, but only abnormal results are displayed) Labs Reviewed - No data to display  EKG  Radiology No results found.  Procedures Procedures  Medications Ordered in UC Medications - No data to display  Initial Impression / Assessment and Plan / UC Course  I have reviewed the triage vital signs and the nursing notes.  Pertinent labs & imaging results that were available during my care of the patient were reviewed by me and considered in my medical decision making (see chart for details).  No covid test is required today for confirmation, as her test was performed at work Discussed can only provide note for 3 days, as patient initially requested 14 days. Discussed would need to have employer complete short term disability or FMLA forms if they required longer. Patient also inquired about antibiotics; discussed this does not treat virus. Symptomatic care discussed.  Final Clinical Impressions(s) / UC Diagnoses   Final diagnoses:  COVID     Discharge Instructions      Test taken yesterday at  work was positive for COVID     ED Prescriptions   None    PDMP not reviewed this encounter.   Marlow Baars, [place] 01/06/24 1349

## 2024-01-10 ENCOUNTER — Ambulatory Visit (HOSPITAL_COMMUNITY): Admission: EM | Admit: 2024-01-10 | Discharge: 2024-01-10 | Disposition: A | Discharge status: Home / Self Care

## 2024-01-10 ENCOUNTER — Encounter (HOSPITAL_COMMUNITY): Payer: Self-pay

## 2024-01-10 ENCOUNTER — Ambulatory Visit (INDEPENDENT_AMBULATORY_CARE_PROVIDER_SITE_OTHER)

## 2024-01-10 DIAGNOSIS — R0989 Other specified symptoms and signs involving the circulatory and respiratory systems: Secondary | ICD-10-CM | POA: Diagnosis not present

## 2024-01-10 DIAGNOSIS — U071 COVID-19: Secondary | ICD-10-CM | POA: Diagnosis not present

## 2024-01-10 DIAGNOSIS — Z8616 Personal history of COVID-19: Secondary | ICD-10-CM | POA: Diagnosis not present

## 2024-01-10 DIAGNOSIS — R918 Other nonspecific abnormal finding of lung field: Secondary | ICD-10-CM | POA: Diagnosis not present

## 2024-01-10 MED ORDER — IBUPROFEN 800 MG PO TABS
800.0000 mg | ORAL_TABLET | Freq: Three times a day (TID) | ORAL | 0 refills | Status: DC
Start: 2024-01-10 — End: 2024-02-09

## 2024-01-10 MED ORDER — AZELASTINE HCL 0.1 % NA SOLN
1.0000 | Freq: Two times a day (BID) | NASAL | 1 refills | Status: DC
Start: 2024-01-10 — End: 2024-02-09

## 2024-01-10 MED ORDER — BENZONATATE 200 MG PO CAPS
200.0000 mg | ORAL_CAPSULE | Freq: Three times a day (TID) | ORAL | 0 refills | Status: DC | PRN
Start: 2024-01-10 — End: 2024-02-09

## 2024-01-10 NOTE — Discharge Instructions (Addendum)
 1. COVID-19 (Primary) - DG Chest 2 View x-ray performed in UC shows no acute cardiopulmonary processes, no sign of consolidation or pneumonia. - azelastine (ASTELIN) 0.1 % nasal spray; Place 1 spray into both nostrils 2 (two) times daily. Use in each nostril as directed  Dispense: 30 mL; Refill: 1 - benzonatate (TESSALON) 200 MG capsule; Take 1 capsule (200 mg total) by mouth 3 (three) times daily as needed for cough.  Dispense: 20 capsule; Refill: 0 - ibuprofen (ADVIL) 800 MG tablet; Take 1 tablet (800 mg total) by mouth 3 (three) times daily.  Dispense: 21 tablet; Refill: 0 -Take medications as directed continue to monitor symptoms for worsening severity if you experience any escalation of current symptoms or development of new symptoms follow-up for further evaluation and management.

## 2024-01-10 NOTE — ED Provider Notes (Signed)
 [facility]   Note:  This document was prepared using Dragon voice recognition software and may include unintentional dictation errors.  MRN: [id number] DOB: 04-27-1986  Subjective:   Virginia Cardenas is a 38 y.o. female presenting for continued body aches, cough, congestion, difficulty breathing, chills/fever x 4 days.  Patient reports that she was diagnosed on 01/06/2024 with COVID 19.  Patient reports she is not taking any over-the-counter medication or previously prescribed medication to treat symptoms.  Patient states that yesterday she stayed in bed all day.  Today she is feeling extremely fatigued and sore all over.  No current facility-administered medications for this encounter.  Current Outpatient Medications:    azelastine (ASTELIN) 0.1 % nasal spray, Place 1 spray into both nostrils 2 (two) times daily. Use in each nostril as directed, Disp: 30 mL, Rfl: 1   benzonatate (TESSALON) 200 MG capsule, Take 1 capsule (200 mg total) by mouth 3 (three) times daily as needed for cough., Disp: 20 capsule, Rfl: 0   ibuprofen (ADVIL) 800 MG tablet, Take 1 tablet (800 mg total) by mouth 3 (three) times daily., Disp: 21 tablet, Rfl: 0   Levonorgestrel-Eth Estradiol (TWIRLA) 120-30 MCG/24HR PTWK, Place 1 patch onto the skin once a week., Disp: , Rfl:    Semaglutide-Weight Management (WEGOVY) 1.7 MG/0.75ML SOAJ, Inject 1.7 mg into the skin once a week., Disp: 3 mL, Rfl: 2   Allergies  Allergen Reactions   Latex Rash   Metronidazole Rash    See photo of rash in office visit note from 06/01/16    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   Pituitary mass (HCC) 11/28/2019   Pituitary tumor 01/11/2020     Past Surgical History:  Procedure Laterality Date   CESAREAN SECTION     CRANIOTOMY N/A 01/11/2020   Procedure: ENDOSCOPIC TRANSNASAL TRANSPEHENOIDAL RESECTION OF PITUITARY TUMOR;  Surgeon: Lisbeth Renshaw, MD;  Location:  [facility] OR;  Service: Neurosurgery;  Laterality: N/A;  ENDOSCOPIC TRANSNASAL TRANSPEHENOIDAL RESECTION OF PITUITARY TUMOR   CRANIOTOMY N/A 02/25/2023   Procedure: ENDOSCOPIC TRANSPHENOIDAL RESECTION OF PITUITARY TUMOR;  Surgeon: Lisbeth Renshaw, MD;  Location: [facility] OR;  Service: Neurosurgery;  Laterality: N/A;   SINUS ENDO WITH FUSION N/A 01/11/2020   Procedure: SINUS ENDO WITH FUSION;  Surgeon: Osborn Coho, MD;  Location: [facility];  Service: ENT;  Laterality: N/A;  SINUS ENDO WITH FUSION   TRANSPHENOIDAL APPROACH EXPOSURE N/A 02/25/2023   Procedure: TRANSPHENOIDAL APPROACH EXPOSURE;  Surgeon: Laren Boom, DO;  Location: [facility] OR;  Service: ENT;  Laterality: N/A;    Family History  Problem Relation Age of Onset   Cancer Neg Hx    Diabetes Neg Hx    Heart failure Neg Hx    Hyperlipidemia Neg Hx     Social History   Tobacco Use   Smoking status: Former    Current packs/day: 0.00    Average packs/day: 0.5 packs/day for 0.5 years (0.2 ttl pk-yrs)    Types: Cigarettes    Start date: 06/2019    Quit date: 12/2019    Years since quitting: 4.0    Passive exposure: Current   Smokeless tobacco: Never  Vaping Use   Vaping status: Never Used  Substance Use Topics   Alcohol use: Yes    Comment: occ   Drug use: No    ROS Refer to HPI for ROS details.  Objective:   Vitals: BP 117/77 (BP Location: Left Arm)  Pulse 83   Temp 98.1 F (36.7 C) (Oral)   Resp 16   Ht 5\' 5"  (1.651 m)   Wt 235 lb (106.6 kg)   LMP 01/03/2024 (Exact Date)   SpO2 96%   BMI 39.11 kg/m   Physical Exam Vitals and nursing note reviewed.  Constitutional:      General: She is not in acute distress.    Appearance: She is well-developed. She is ill-appearing. She is not toxic-appearing.  HENT:     Head: Normocephalic.     Nose: Congestion and rhinorrhea present.     Mouth/Throat:     Mouth: Mucous membranes are moist.  Eyes:     Extraocular Movements: Extraocular movements intact.      Conjunctiva/sclera: Conjunctivae normal.  Cardiovascular:     Rate and Rhythm: Normal rate and regular rhythm.     Heart sounds: No murmur heard. Pulmonary:     Effort: Pulmonary effort is normal. No respiratory distress.     Breath sounds: Normal breath sounds. No stridor. No wheezing, rhonchi or rales.  Abdominal:     Palpations: Abdomen is soft.     Tenderness: There is no abdominal tenderness. There is no right CVA tenderness, left CVA tenderness, guarding or rebound.  Skin:    General: Skin is warm and dry.     Capillary Refill: Capillary refill takes less than 2 seconds.  Neurological:     General: No focal deficit present.     Mental Status: She is alert and oriented to person, place, and time.  Psychiatric:        Mood and Affect: Mood normal.        Behavior: Behavior normal.     Procedures  No results found for this or any previous visit (from the past 24 hours).  Assessment and Plan :   PDMP not reviewed this encounter.  1. COVID-19   2. Chest congestion    1. COVID-19 (Primary) - DG Chest 2 View x-ray performed in UC shows no acute cardiopulmonary processes, no sign of consolidation or pneumonia. - azelastine (ASTELIN) 0.1 % nasal spray; Place 1 spray into both nostrils 2 (two) times daily. Use in each nostril as directed  Dispense: 30 mL; Refill: 1 - benzonatate (TESSALON) 200 MG capsule; Take 1 capsule (200 mg total) by mouth 3 (three) times daily as needed for cough.  Dispense: 20 capsule; Refill: 0 - ibuprofen (ADVIL) 800 MG tablet; Take 1 tablet (800 mg total) by mouth 3 (three) times daily.  Dispense: 21 tablet; Refill: 0 -Take medications as directed continue to monitor symptoms for worsening severity if you experience any escalation of current symptoms or development of new symptoms follow-up for further evaluation and management.  Lucky Cowboy   [place], [place], [place] 01/10/24 [phone]

## 2024-01-10 NOTE — ED Triage Notes (Signed)
 Patient is complaining about body soreness, nasal congestion and discomfort when taking breaths. Patient diagnosis with covid 3.21.25 and symptoms has gotten worse.

## 2024-01-12 ENCOUNTER — Telehealth (HOSPITAL_COMMUNITY): Payer: Self-pay

## 2024-01-12 MED ORDER — LEVOFLOXACIN 500 MG PO TABS: 500.0000 mg | ORAL_TABLET | Freq: Every day | ORAL | 0 refills | Status: AC

## 2024-01-12 NOTE — Telephone Encounter (Signed)
 Per S. Rodriguez-Southworth, PA-C, "Please inform her that her chest xray shows changes that could be viral pneumonia from Covid. She may also have an underlying bacteria pneumonia, the radiologist could not confirm this, so please call her in Levaquin 500 mg every day x 5 days, # 10. Explain to her, if it is just the Viral pneumonia, the Levaquin will not help, but will cover a possible bacterial pneumonia. If she wants to know 100% if it is bacterial, then can FU with her PCP today and they can order a CBC with diff. Please ask her to follow up with her PCP next week."  RN contacted PA to clarify if PA meant 250mg  BID x5 today to equal a total of 500mg  daily/#10, OR 500mg  daily. Per PA, "500 daily."  Reviewed with patient, verified pharmacy, prescription sent.

## 2024-01-17 ENCOUNTER — Other Ambulatory Visit: Payer: Self-pay | Admitting: Radiation Therapy

## 2024-01-18 ENCOUNTER — Ambulatory Visit: Admitting: Student

## 2024-01-18 VITALS — BP 116/88 | HR 76 | Temp 98.0°F | Ht 65.0 in | Wt 252.4 lb

## 2024-01-18 DIAGNOSIS — R059 Cough, unspecified: Secondary | ICD-10-CM

## 2024-01-18 DIAGNOSIS — U071 COVID-19: Secondary | ICD-10-CM | POA: Diagnosis not present

## 2024-01-18 MED ORDER — FLUTICASONE PROPIONATE 50 MCG/ACT NA SUSP
1.0000 | Freq: Every day | NASAL | 12 refills | Status: DC
Start: 2024-01-18 — End: 2024-02-09

## 2024-01-18 NOTE — Patient Instructions (Signed)
 It was great to see you! Thank you for allowing me to participate in your care!  I recommend that you always bring your medications to each appointment as this makes it easy to ensure you are on the correct medications and helps [place] not miss when refills are needed.  Our plans for today:  - return if symptoms don't improve over the next week or worsen - See hand out on upper respiratory infections   Take care and seek immediate care sooner if you develop any concerns.   Dr. Erick Alley, DO [facility]

## 2024-01-18 NOTE — Progress Notes (Signed)
    SUBJECTIVE:   CHIEF COMPLAINT / HPI:   Miss Peyser, a patient with a recent diagnosis of COVID-19, presents with persistent symptoms since the 20th. The patient tested positive for COVID-19 on the 21st and sought care at an urgent care facility on the 25th due to ongoing symptoms. Initial diagnosis at the urgent care was viral pneumonia, but the patient was later prescribed antibiotics for a potential bacterial pneumonia which she completed.  States she notes no improvement in her symptoms since completing antibiotics.  The patient reports coughing, producing green sputum, which has remained consistent since the onset of symptoms.  Admits to mild shortness of breath with coughing only.  The patient has been managing the cough with Jerilynn Som, which have been effective.  The patient also reports fatigue and intermittent body aches. The patient describes the fatigue as a general lack of energy and motivation to engage in activities. The body aches are described as sporadic, with some days being better than others.  The patient reports the highest recorded temperature being 100 degrees. The patient has been managing symptoms with home remedies, Tylenol, and ibuprofen.  The patient also reports changes in bowel movements, with increased frequency and occasional episodes of soft stool. The patient has been able to eat and drink and denies vomiting.  PERTINENT  PMH / PSH: Obesity, prediabetes  OBJECTIVE:   BP 116/88   Pulse 76   Temp 98 F (36.7 C)   Ht 5\' 5"  (1.651 m)   Wt 252 lb 6.4 oz (114.5 kg)   LMP 01/03/2024 (Exact Date)   SpO2 100%   BMI 42.00 kg/m    General: NAD, pleasant, able to participate in exam HEENT: White sclera, clear conjunctiva, MMM, no erythema or exudate of oropharynx Cardiac: RRR, no murmurs. Respiratory: CTAB, normal effort, No wheezes, rales or rhonchi Abdomen: Bowel sounds present, mild diffuse tenderness to palpation without guarding, nondistended,  soft Skin: warm and dry Neuro: alert, no obvious focal deficits Psych: Normal affect and mood  ASSESSMENT/PLAN:   COVID-19 Previously tested positive for COVID-19 with symptoms consistent with viral pneumonia. Persistent cough, green sputum, fatigue, body aches noted.  She is s/p treatment with Levaquin though I have low suspicion she ever had a bacterial pneumonia. Lungs clear on examination.  Though she is going on 2 weeks of symptoms, she is overall well-appearing.  I suspect this infection was hard on her and she is requiring more time to heal and has a postviral cough.  She also has history of seasonal allergies and can consider that this is playing a role in her symptoms. - Continue acetaminophen and ibuprofen for fever and pain. - Prescribe Flonase for nasal congestion and postnasal drip to use in addition to azelastine nasal spray - Advise guaifenesin for mucus clearance, ensure adequate hydration. - Monitor symptoms, return if fever exceeds 100.33F or worsening dyspnea or pain - Provide work note to remain out of work until recovery, planned return on Monday.     Dr. Erick Alley, DO  [facility]

## 2024-01-18 NOTE — Assessment & Plan Note (Signed)
 Previously tested positive for COVID-19 with symptoms consistent with viral pneumonia. Persistent cough, green sputum, fatigue, body aches noted.  She is s/p treatment with Levaquin though I have low suspicion she ever had a bacterial pneumonia. Lungs clear on examination.  Though she is going on 2 weeks of symptoms, she is overall well-appearing.  I suspect this infection was hard on her and she is requiring more time to heal and has a postviral cough.  She also has history of seasonal allergies and can consider that this is playing a role in her symptoms. - Continue acetaminophen and ibuprofen for fever and pain. - Prescribe Flonase for nasal congestion and postnasal drip to use in addition to azelastine nasal spray - Advise guaifenesin for mucus clearance, ensure adequate hydration. - Monitor symptoms, return if fever exceeds 100.64F or worsening dyspnea or pain - Provide work note to remain out of work until recovery, planned return on Monday.

## 2024-01-23 ENCOUNTER — Inpatient Hospital Stay: Attending: Neurosurgery

## 2024-01-31 IMAGING — MR MR HEAD WO/W CM
7 of 13 series · 21 of 48 positions shown · IV contrast (gadavist)
Comparison: 12/20/2019

CLINICAL DATA: Vision impairment, dizziness

EXAM:
MRI HEAD WITHOUT AND WITH CONTRAST
TECHNIQUE: Multiplanar, multiecho pulse sequences of the brain and surrounding
structures were obtained without and with intravenous contrast.
CONTRAST:  10mL GADAVIST GADOBUTROL 1 MMOL/ML IV SOLN

[Series 2: DWI · axial · 3.0mm · 0.94mm/px · z∈[-71,+76]mm · 6 of 100 slices shown (1 of 2)]
[im 1/100]
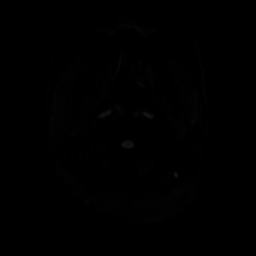
[im 20/100]
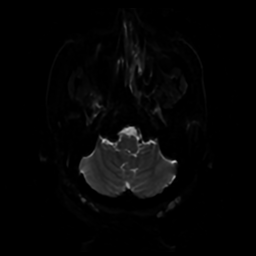
[im 40/100]
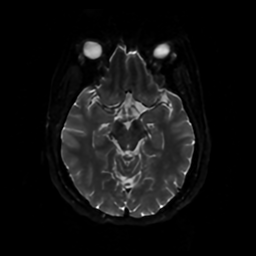
[im 60/100]
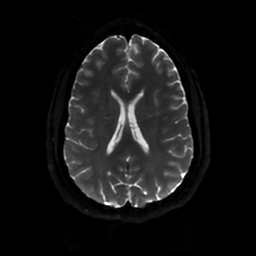
[im 80/100]
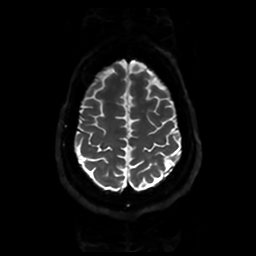
[im 100/100]
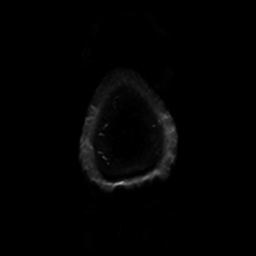

[Series 3: DWI · coronal · 4.0mm · 0.94mm/px · 4 of 74 slices shown (2 of 2)]
[im 1/74]
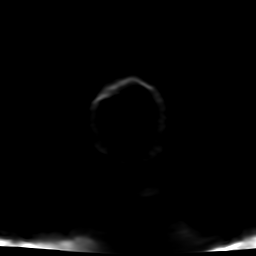
[im 25/74]
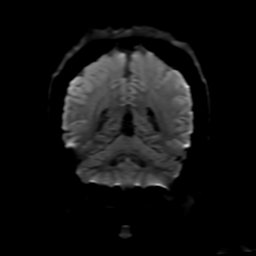
[im 49/74]
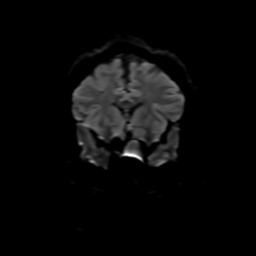
[im 74/74]
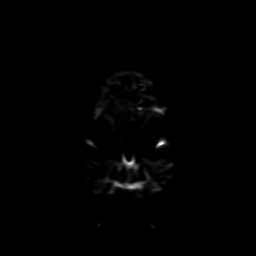

[Series 4: FLAIR · sagittal · 5.0mm · 0.23mm/px · 2 of 24 slices shown (1 of 2)]
[im 1/24]
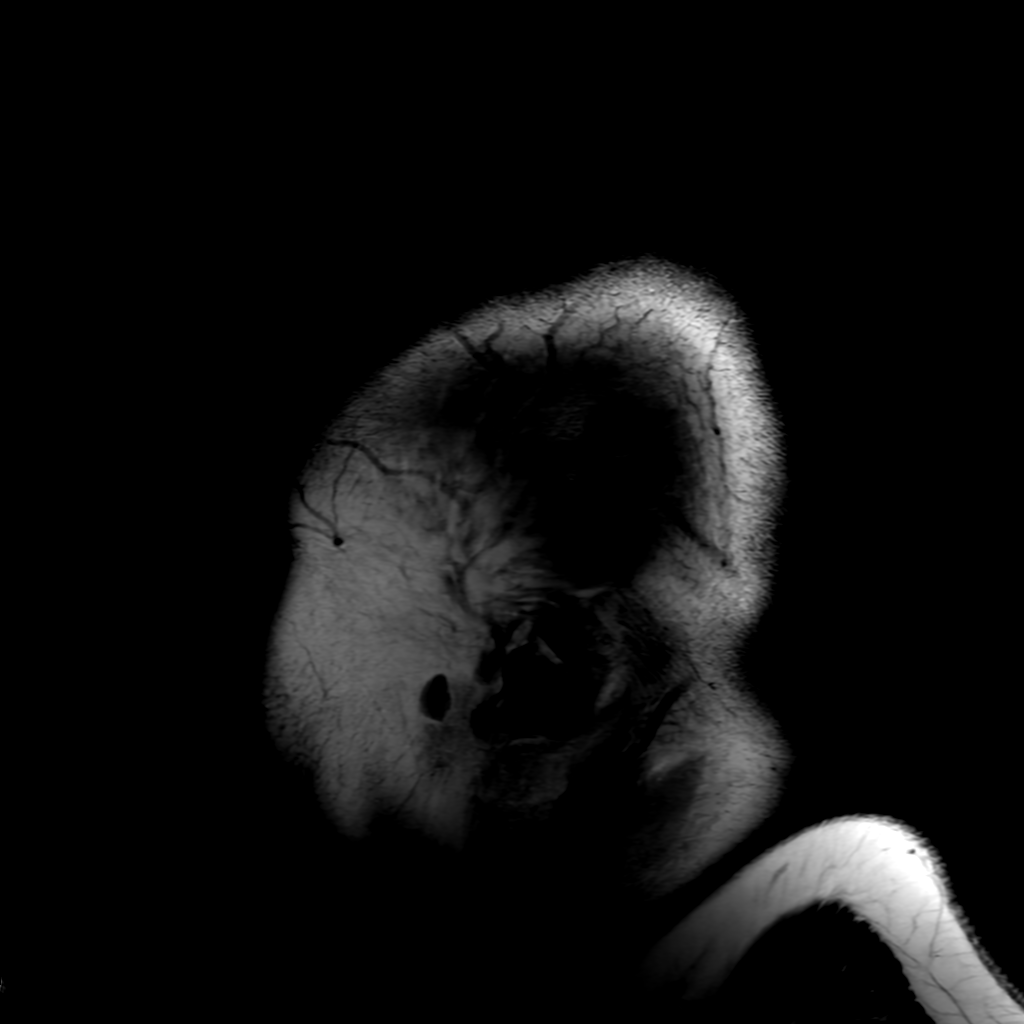
[im 24/24]
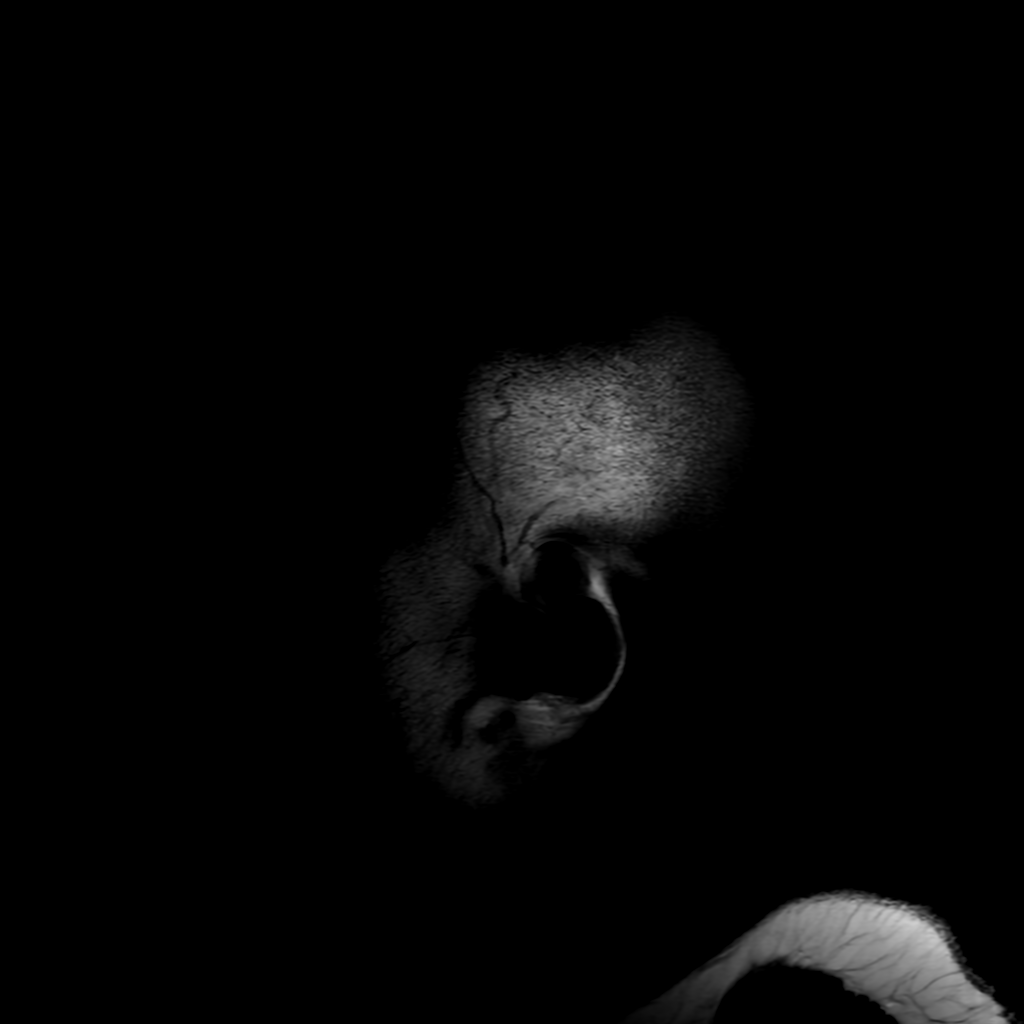

[Series 5: T2 · axial · 5.0mm · 0.23mm/px · 1 of 26 slices shown]
[im 1/26]
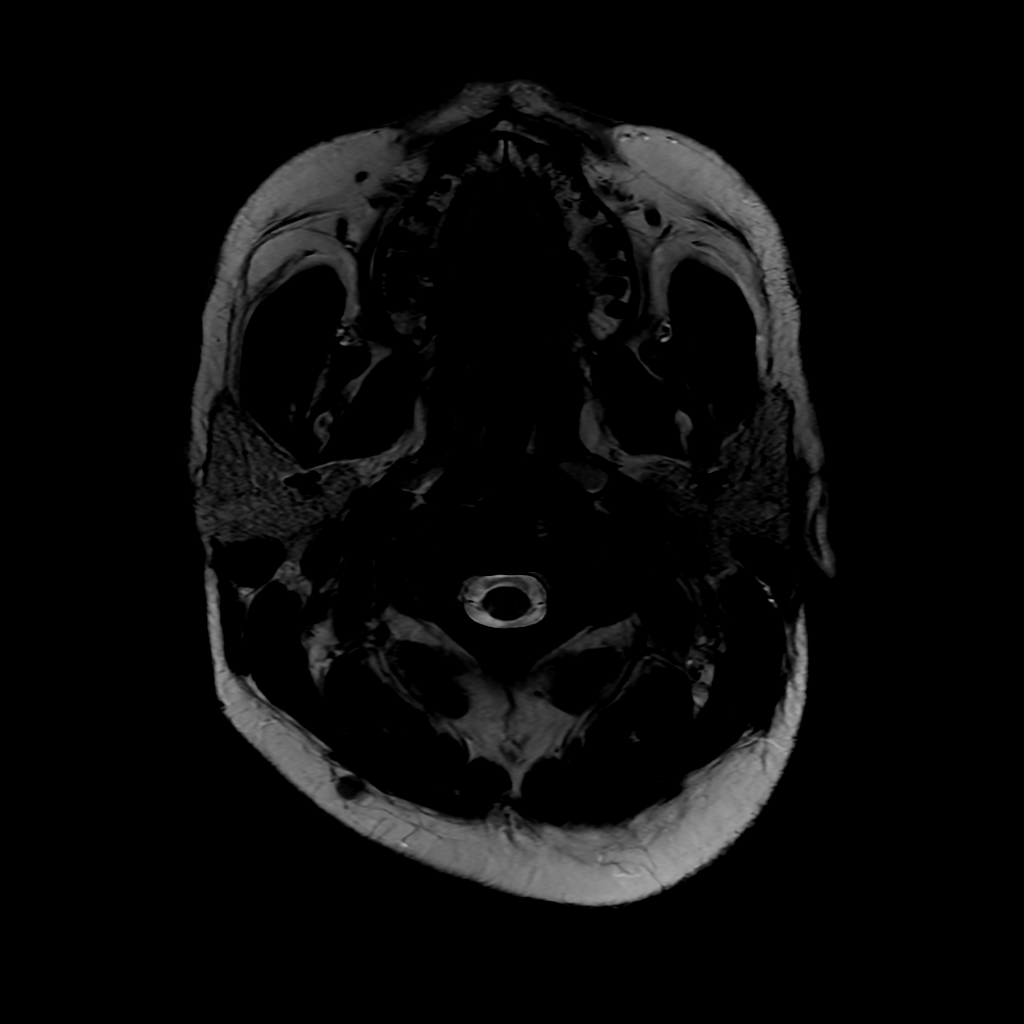

[Series 6: FLAIR · axial · 4.0mm · 0.45mm/px · z∈[-74,+76]mm · 2 of 35 slices shown (2 of 2)]
[im 1/35]
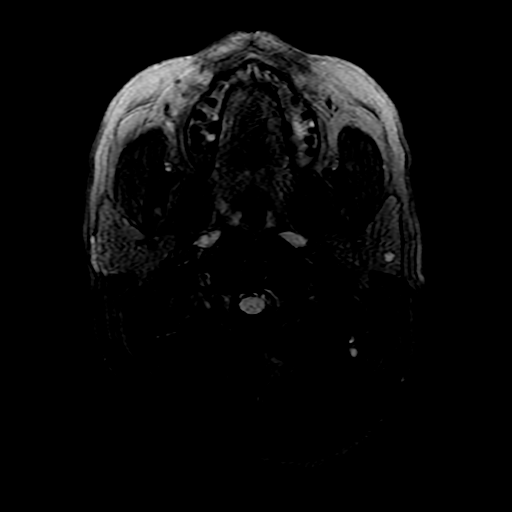
[im 35/35]
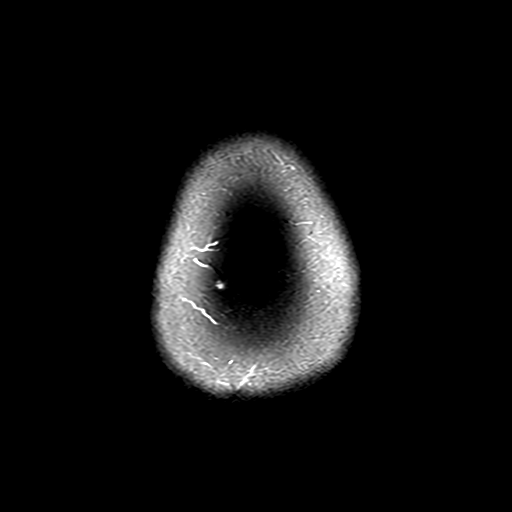

[Series 250: ADC · axial · 3.0mm · 0.94mm/px · z∈[-71,+76]mm · 3 of 50 slices shown (1 of 2)]
[im 1/50]
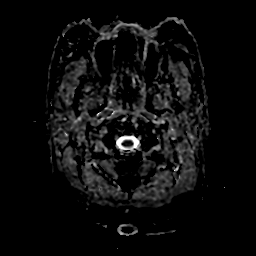
[im 25/50]
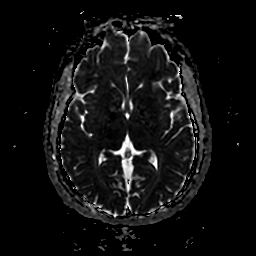
[im 50/50]
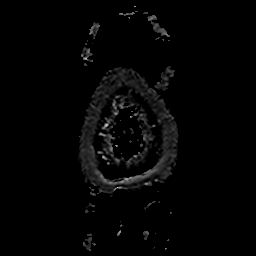

[Series 350: ADC · coronal · 4.0mm · 0.94mm/px · 3 of 37 slices shown (2 of 2)]
[im 1/37]
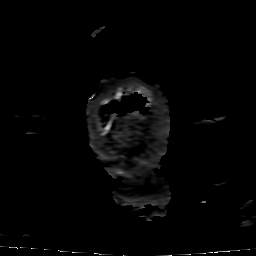
[im 19/37]
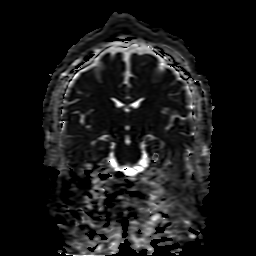
[im 37/37]
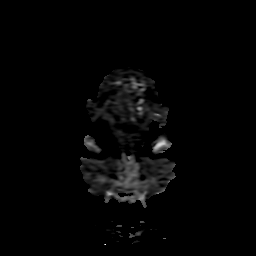

[21 of 48 positions shown; findings below may reference images not displayed]

FINDINGS: A dedicated sella protocol was not performed. The sellar/suprasellar
mass has decreased in size. Signal remains mildly heterogeneous with
resolution of any intrinsic T1 hyperintensity. The infundibulum is
now visualized displaced to the right and likely contiguous with
normal gland. Optic chiasm is now free from compression. Mass may
still compress the cisternal left optic nerve. Left cavernous sinus
invasion appears present as suspected previously. Mass presently
measures about 2.9 x 2.7 x 1.9 cm (previously 2.8 x 3.4 x 3.9 cm).

Brain: There is no acute infarction or intracranial hemorrhage.
There is no intracranial mass, mass effect, or edema. There is no
hydrocephalus or extra-axial fluid collection. Ventricles and sulci
are normal in size and configuration. Punctate focus of nonspecific
gliosis/demyelination in the left frontal subcortical white matter.

Vascular: Major vessel flow voids at the skull base are preserved.

Skull and upper cervical spine: Normal marrow signal is preserved.

Sinuses/Orbits: Minor mucosal thickening.  Orbits are unremarkable.

Other: Sella is unremarkable.  Mastoid air cells are clear.
IMPRESSION: Dedicated sella study was not performed and evaluation is within
this limitation. Contraction in left sellar/suprasellar mass since
12/20/2019. Optic chiasm is no longer compressed but the mass may
still compress the cisternal left optic nerve. Left cavernous sinus
invasion is suspected as before.

No acute abnormality.

## 2024-02-09 ENCOUNTER — Ambulatory Visit (HOSPITAL_COMMUNITY)
Admission: EM | Admit: 2024-02-09 | Discharge: 2024-02-09 | Disposition: A | Discharge status: Home / Self Care | Attending: Family Medicine | Admitting: Family Medicine

## 2024-02-09 ENCOUNTER — Other Ambulatory Visit: Payer: Self-pay

## 2024-02-09 ENCOUNTER — Encounter (HOSPITAL_COMMUNITY): Payer: Self-pay | Admitting: Emergency Medicine

## 2024-02-09 DIAGNOSIS — R197 Diarrhea, unspecified: Secondary | ICD-10-CM | POA: Diagnosis not present

## 2024-02-09 DIAGNOSIS — R1084 Generalized abdominal pain: Secondary | ICD-10-CM

## 2024-02-09 DIAGNOSIS — R112 Nausea with vomiting, unspecified: Secondary | ICD-10-CM

## 2024-02-09 DIAGNOSIS — R21 Rash and other nonspecific skin eruption: Secondary | ICD-10-CM | POA: Diagnosis not present

## 2024-02-09 MED ORDER — NYSTATIN 100000 UNIT/GM EX CREA: TOPICAL_CREAM | CUTANEOUS | 0 refills | Status: DC

## 2024-02-09 MED ORDER — ONDANSETRON 4 MG PO TBDP: 4.0000 mg | ORAL_TABLET | Freq: Three times a day (TID) | ORAL | 0 refills | Status: DC | PRN

## 2024-02-09 MED ORDER — ONDANSETRON HCL 4 MG/2ML IJ SOLN
4.0000 mg | Freq: Once | INTRAMUSCULAR | Status: AC
  Administered 2024-02-09: 4 mg via INTRAMUSCULAR

## 2024-02-09 MED ORDER — ONDANSETRON HCL 4 MG/2ML IJ SOLN
INTRAMUSCULAR | Status: AC
  Filled 2024-02-09: qty 2

## 2024-02-09 NOTE — Discharge Instructions (Signed)
 You were seen today for abdominal pain, nausea, vomiting and diarrhea.  I have given you a shot of medication for nausea.  I have also sent this to your pharmacy.  I have ordered stool testing to be done given the severity of your diarrhea.  You may collect this at home and return it to the lab.  I have sent  out a cream to use for your buttocks.  You may also use over the counter Desitin cream as a barrier.  Please get plenty of fluids.   If you have worsening pain, nausea, vomiting or diarrhea despite medications then please go to the ER for further evaluation.

## 2024-02-09 NOTE — ED Notes (Signed)
 Patient did receive 2 school notes for children  Provided patient with supplies to collect stool specimen at home

## 2024-02-09 NOTE — ED Triage Notes (Signed)
 Diarrhea for 2-3 days, has vomited 2 times during the past few days.  Patient has taken pepto bismal.  Patient reports diarrhea episodes are uncontrolled at times

## 2024-02-09 NOTE — ED Provider Notes (Signed)
 [facility]    CSN: [id number] Arrival date & time: 02/09/24  1057      History   Chief Complaint Chief Complaint  Patient presents with   Emesis   Diarrhea    HPI Virginia Cardenas is a 38 y.o. female.    Emesis Associated symptoms: diarrhea   Diarrhea Associated symptoms: vomiting    Patient is here for diarrhea and vomiting x 3 days.  Mild abdominal pain throughout her abdomen.  No recent travel, has not eaten anything new/different.  She is able to eat/drink, but does not "stay on her stomach for too long".   She did take pepto, but then changed to gray/black in color.   No bright red blood per se.  No h/o stomach issues.  No fevers/chills.  No urinary symptoms.  No other sick contacts.   Her buttocks is very sore and raw from the loose stools.       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   History of pituitary adenoma 01/07/2020   Deviated septum 01/07/2020   Cervical cancer screening 03/12/2018   Class 2 severe obesity with serious comorbidity and body mass index (BMI) of 39.0 to 39.9 in adult (HCC) 03/12/2018    Past Surgical History:  Procedure Laterality Date   CESAREAN SECTION     CRANIOTOMY N/A 01/11/2020   Procedure: ENDOSCOPIC TRANSNASAL TRANSPEHENOIDAL RESECTION OF PITUITARY TUMOR;  Surgeon: Augusto Blonder, MD;  Location: [facility] OR;  Service:  Neurosurgery;  Laterality: N/A;  ENDOSCOPIC TRANSNASAL TRANSPEHENOIDAL RESECTION OF PITUITARY TUMOR   CRANIOTOMY N/A 02/25/2023   Procedure: ENDOSCOPIC TRANSPHENOIDAL RESECTION OF PITUITARY TUMOR;  Surgeon: Augusto Blonder, MD;  Location: [facility] OR;  Service: Neurosurgery;  Laterality: N/A;   SINUS ENDO WITH FUSION N/A 01/11/2020   Procedure: SINUS ENDO WITH FUSION;  Surgeon: Ammon Bales, MD;  Location[facility] OR;  Service: ENT;  Laterality: N/A;  SINUS ENDO WITH FUSION   TRANSPHENOIDAL APPROACH EXPOSURE N/A 02/25/2023   Procedure: TRANSPHENOIDAL APPROACH EXPOSURE;  Surgeon: Daleen Dubs, DO;  Location: [facility];  Service: ENT;  Laterality: N/A;    OB History   No obstetric history on file.      Home Medications    Prior to Admission medications   Medication Sig Start Date End Date Taking? Authorizing Provider  azelastine  (ASTELIN ) 0.1 % nasal spray Place 1 spray into both nostrils 2 (two) times daily. Use in each nostril as directed 01/10/24   Reddick, Johnathan B, NP  benzonatate  (TESSALON ) 200 MG capsule Take 1 capsule (200 mg total) by mouth 3 (three) times daily as needed for cough. 01/10/24   Reddick, Johnathan B, NP  fluticasone  (FLONASE ) 50 MCG/ACT nasal spray Place 1 spray into both nostrils daily. 1 spray in each nostril every day 01/18/24   Glenn Lange, DO  ibuprofen  (ADVIL ) 800 MG tablet Take 1 tablet (800 mg total) by mouth 3 (three) times daily. 01/10/24   Reddick, Johnathan  B, NP  Levonorgestrel-Eth Estradiol  (TWIRLA) 120-30 MCG/24HR PTWK Place 1 patch onto the skin once a week.    [provider]  Semaglutide -Weight Management (WEGOVY ) 1.7 MG/0.75ML SOAJ Inject 1.7 mg into the skin once a week. 12/02/23   Wilhemena Harbour, MD    Family History Family History  Problem Relation Age of Onset   Cancer Neg Hx    Diabetes Neg Hx    Heart failure Neg Hx    Hyperlipidemia Neg Hx     Social History Social History   Tobacco Use   Smoking status: Former    Current  packs/day: 0.00    Average packs/day: 0.5 packs/day for 0.5 years (0.2 ttl pk-yrs)    Types: Cigarettes    Start date: 06/2019    Quit date: 12/2019    Years since quitting: 4.1    Passive exposure: Current   Smokeless tobacco: Never  Vaping Use   Vaping status: Never Used  Substance Use Topics   Alcohol use: Yes    Comment: occ   Drug use: No     Allergies   Latex and Metronidazole    Review of Systems Review of Systems  Constitutional:  Positive for appetite change.  HENT: Negative.    Respiratory: Negative.    Gastrointestinal:  Positive for diarrhea and vomiting.  Genitourinary: Negative.   Musculoskeletal: Negative.   Skin: Negative.   Psychiatric/Behavioral: Negative.       Physical Exam Triage Vital Signs ED Triage Vitals  Encounter Vitals Group     BP 02/09/24 1209 94/65     Systolic BP Percentile --      Diastolic BP Percentile --      Pulse Rate 02/09/24 1209 80     Resp 02/09/24 1209 18     Temp 02/09/24 1209 97.9 F (36.6 C)     Temp Source 02/09/24 1209 Oral     SpO2 02/09/24 1209 97 %     Weight --      Height --      Head Circumference --      Peak Flow --      Pain Score 02/09/24 1207 10     Pain Loc --      Pain Education --      Exclude from Growth Chart --    No data found.  Updated Vital Signs BP 94/65 (BP Location: Left Arm) Comment (BP Location): large cuff  Pulse 80   Temp 97.9 F (36.6 C) (Oral)   Resp 18   LMP 02/04/2024 (Exact Date)   SpO2 97%   Visual Acuity Right Eye Distance:   Left Eye Distance:   Bilateral Distance:    Right Eye Near:   Left Eye Near:    Bilateral Near:     Physical Exam Constitutional:      General: She is not in acute distress.    Appearance: Normal appearance. She is normal weight.  Cardiovascular:     Rate and Rhythm: Normal rate and regular rhythm.  Pulmonary:     Effort: Pulmonary effort is normal.     Breath sounds: Normal breath sounds.  Abdominal:     Palpations: Abdomen is  soft.     Tenderness: There is abdominal tenderness. There is no guarding or rebound.     Comments: She has diffuse mild tenderness throughout the abdomen  Musculoskeletal:     Cervical back: Neck supple.  Skin:    General: Skin is warm.     Comments: Patient  deferred exam of buttocks  Neurological:     General: No focal deficit present.     Mental Status: She is alert.  Psychiatric:        Mood and Affect: Mood normal.      UC Treatments / Results  Labs (all labs ordered are listed, but only abnormal results are displayed) Labs Reviewed - No data to display  EKG   Radiology No results found.  Procedures Procedures (including critical care time)  Medications Ordered in UC Medications - No data to display  Initial Impression / Assessment and Plan / UC Course  I have reviewed the triage vital signs and the nursing notes.  Pertinent labs & imaging results that were available during my care of the patient were reviewed by me and considered in my medical decision making (see chart for details).   Final Clinical Impressions(s) / UC Diagnoses   Final diagnoses:  Diarrhea, unspecified type  Nausea and vomiting, unspecified vomiting type  Generalized abdominal pain  Rash     Discharge Instructions      You were seen today for abdominal pain, nausea, vomiting and diarrhea.  I have given you a shot of medication for nausea.  I have also sent this to your pharmacy.  I have ordered stool testing to be done given the severity of your diarrhea.  You may collect this at home and return it to the lab.  I have sent  out a cream to use for your buttocks.  You may also use over the counter Desitin cream as a barrier.  Please get plenty of fluids.   If you have worsening pain, nausea, vomiting or diarrhea despite medications then please go to the ER for further evaluation.      ED Prescriptions     Medication Sig Dispense Auth. Provider   ondansetron  (ZOFRAN -ODT) 4 MG  disintegrating tablet Take 1 tablet (4 mg total) by mouth every 8 (eight) hours as needed for nausea or vomiting. 20 tablet Tyree Fluharty, MD   nystatin  cream (MYCOSTATIN ) Apply to affected area 2 times daily 30 g Lesle Ras, MD      PDMP not reviewed this encounter.   Lesle Ras, MD 02/09/24 1236

## 2024-02-13 ENCOUNTER — Ambulatory Visit (HOSPITAL_COMMUNITY)

## 2024-02-13 ENCOUNTER — Telehealth: Payer: Self-pay | Admitting: Family Medicine

## 2024-02-13 ENCOUNTER — Ambulatory Visit (INDEPENDENT_AMBULATORY_CARE_PROVIDER_SITE_OTHER): Admitting: Family Medicine

## 2024-02-13 ENCOUNTER — Encounter: Payer: Self-pay | Admitting: Family Medicine

## 2024-02-13 VITALS — BP 108/83 | HR 72 | Ht 65.0 in | Wt 245.2 lb

## 2024-02-13 DIAGNOSIS — Z309 Encounter for contraceptive management, unspecified: Secondary | ICD-10-CM | POA: Diagnosis not present

## 2024-02-13 DIAGNOSIS — Z021 Encounter for pre-employment examination: Secondary | ICD-10-CM | POA: Diagnosis not present

## 2024-02-13 NOTE — Patient Instructions (Signed)
 It was great to see you again today.  Completed form Recommend flu shot every fall  Be well, Dr. Dawn Eth

## 2024-02-13 NOTE — Assessment & Plan Note (Signed)
 On combined estrogen-progesterone patches, rx'd by endocrinologist Added to medication list

## 2024-02-13 NOTE — Telephone Encounter (Signed)
 Telephone Note Encounter  Patient brought in form during visit to be completed. Form type: work physical Completed form in office and given to patient  As form was completed in office today, copy was made and form given back to patient.   Daleen Dubs, MD  [facility]

## 2024-02-13 NOTE — Progress Notes (Signed)
  Date of Visit: 02/13/2024   SUBJECTIVE:   HPI:  Virginia Cardenas presents today for a same day appointment to discuss form completion for work.  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  Needs form completed before she can begin her job.  Only medications she takes are Wegovy  (for weight loss, working well) and birth control patches (prescribed by her endocrinologist).  Declines COVID booster today.  Does not always get annual flu vaccine, discussed today.  PMHx: obesity, pituitary adenoma s/p resection, prediabetes, hyperlipidemia   OBJECTIVE:   BP 108/83   Pulse 72   Ht 5\' 5"  (1.651 m)   Wt 245 lb 3.2 oz (111.2 kg)   LMP 02/04/2024 (Exact Date)   SpO2 100%   BMI 40.80 kg/m  Gen: no acute distress, pleasant cooperative HEENT: normocephalic, atraumatic  Heart: regular rate and rhythm, no murmur Lungs: clear to auscultation bilaterally, normal work of breathing  Neuro: alert, grossly nonfocal, speech normal  Ext: No appreciable lower extremity edema bilaterally   ASSESSMENT/PLAN:   Assessment & Plan Pre-employment health screening examination Completed form for job today advising she is capable of working with children Copy to be scanned in Original given to patient Advised yearly flu shot given she will be working with young children Encounter for contraceptive management, unspecified type On combined estrogen-progesterone patches, rx'd by endocrinologist Added to medication list     [place] J. Dawn Eth, MD [facility]

## 2024-02-29 ENCOUNTER — Other Ambulatory Visit: Payer: Self-pay | Admitting: Radiation Therapy

## 2024-02-29 DIAGNOSIS — D352 Benign neoplasm of pituitary gland: Secondary | ICD-10-CM | POA: Diagnosis not present

## 2024-02-29 DIAGNOSIS — Z6841 Body Mass Index (BMI) 40.0 and over, adult: Secondary | ICD-10-CM | POA: Diagnosis not present

## 2024-03-05 ENCOUNTER — Inpatient Hospital Stay: Attending: Neurosurgery

## 2024-03-21 DIAGNOSIS — Z6839 Body mass index (BMI) 39.0-39.9, adult: Secondary | ICD-10-CM | POA: Diagnosis not present

## 2024-03-21 DIAGNOSIS — D352 Benign neoplasm of pituitary gland: Secondary | ICD-10-CM | POA: Diagnosis not present

## 2024-03-22 ENCOUNTER — Other Ambulatory Visit: Payer: Self-pay | Admitting: Radiation Therapy

## 2024-03-22 ENCOUNTER — Other Ambulatory Visit: Payer: Self-pay | Admitting: Neurosurgery

## 2024-03-22 DIAGNOSIS — D352 Benign neoplasm of pituitary gland: Secondary | ICD-10-CM

## 2024-03-23 NOTE — Progress Notes (Signed)
 Location/Histology of Brain Tumor:  Benign Neoplasm of Pituitary Gland  Patient presented with symptoms of:   Crusting in bilateral nasal cavities, left greater than right with obstruction of the sphenoid defect. Headaches lasted 2 to 3 days, becoming irritable, patient went to go get an MRI 12/21/2022 Skotnicki, MD  Patient experiencing severe frontal headaches and peripheral vision changes   Past or anticipated interventions, if any, per neurosurgery:  05/04/2024 Nat Badger, MD Craniotomy With Hypophysectomy  02/25/2023 Endoscopic Sinus Surgery with Transsphenoidal Resection of Pituitary Tumor  03/07/2023 Rigid Nasal Endoscopy with Debridement  Past or anticipated interventions, if any, per medical oncology:  Radiation  Dose of Decadron , if applicable: None  Recent neurologic symptoms, if any:  Seizures: None Headaches: Occasional Headaches, when headaches occur, they are very severe. Nausea: None Dizziness/ataxia: One episode of dizziness yesterday Difficulty with hand coordination: None Focal numbness/weakness: Right toe is numb Visual deficits/changes: Vision in room is dimmed Confusion/Memory deficits: None  Painful bone metastases at present, if any: None  SAFETY ISSUES: Prior radiation? None Pacemaker/ICD? None Possible current pregnancy? None Is the patient on methotrexate? None  Additional Complaints / other details:  None Wt Readings from Last 3 Encounters:  03/27/24 237 lb 12.8 oz (107.9 kg)  02/13/24 245 lb 3.2 oz (111.2 kg)  01/18/24 252 lb 6.4 oz (114.5 kg)   BP 106/86 (BP Location: Right Arm, Patient Position: Sitting, Cuff Size: Large)   Pulse 68   Temp (!) 97 F (36.1 C)   Resp 18   Ht 5\' 5"  (1.651 m)   Wt 237 lb 12.8 oz (107.9 kg)   SpO2 100%   BMI 39.57 kg/m

## 2024-03-26 NOTE — Progress Notes (Incomplete)
 Radiation Oncology         [phone] ________________________________  Initial outpatient Consultation  Name: Virginia Cardenas MRN: [id number]  Date: 03/27/2024  DOB: 1986/10/03  WU:[id number], Ace Holder, MD  Augusto Blonder, MD   REFERRING PHYSICIAN: Augusto Blonder, MD  DIAGNOSIS:  No diagnosis found.   Benign neoplasm of pituitary gland (HCC)   HISTORY OF PRESENT ILLNESS::Virginia Cardenas is a 38 y.o. female who was diagnosed with a pituitary tumor in March of 2023 after patient was experiencing severe headaches and vision changes .  She was then referred to Dr. Nat Badger and underwent a brain MRI on 12/20/19 revealing a sellar/suprasellar mass with compression of the optic chiasm and suspected left cavernous sinus involvement measuring 2.8 x 3.4 x 3.9 cm. No acute infarction or intracranial hemorrhage or parenchyma mass or edema indicated on scan.  Subsequently, she underwent a transsphenoidal resection of pituitary adenoma with Neurosurgery and Dr. Melissa Spring in 2021. Patient recovered well from procedure with improvements of symptoms. However, she returned to clinic on 12/21/22 with DO Skotnicki with complains of experiencing severe frontal headaches and peripheral vision changes. She underwent a CT sinus showing deviated septum to the left and some residual anterior ethmoid cells and mild mucosal thickening within the bilateral maxillary sinuses and nasal passages.    Subsequently, she underwent an endoscopic transphenoidal resection of pituitary gland on 02/25/23 under the care of Dr. Augusto Blonder.  She returned for a follow up on 03/28/23. At that time, mucopurulent drainage was noted, which was cultured and came back positive with moderate growth of dysuria coli and Staph aureus. Patient was prescribed oral antibiotics and advised to apply mupirocin  ointment to her nares, in conjunction with twice daily nasal saline irrigations. She then underwent a brain MRI on 03/28/23 showing  significant interval increase in sellar/suprasellar mass with resulting mass effect on the optic chiasm and invasion of the left cavernous sinus. Mass now measuring 3.3 x 3.5 by 2.7 cm.     Most recent MRI of the brain on 12/26/23 demonstrated increased size of enhancing pituitary mass measuring up to 3.5 x 2.6 x 3.4 cm previously measuring 2.9 x 2.0 x 3.0 cm with extension into the left cavernous sinus and left middle cranial fossa and right cavernous sinus.   Patient case was reviewed at the multidisciplinary Neuro-Oncology conference who recommended undergoing radiation therapy and undergoing a craniotomy with hypophysectomy which is scheduled for July 18th.     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    PAST SURGICAL HISTORY: Past Surgical History:  Procedure Laterality Date   CESAREAN SECTION     CRANIOTOMY N/A 01/11/2020   Procedure: ENDOSCOPIC TRANSNASAL TRANSPEHENOIDAL RESECTION OF PITUITARY TUMOR;  Surgeon: Augusto Blonder, MD;  Location: [facility] OR;  Service: Neurosurgery;  Laterality: N/A;  ENDOSCOPIC TRANSNASAL TRANSPEHENOIDAL RESECTION OF PITUITARY TUMOR   CRANIOTOMY N/A 02/25/2023   Procedure: ENDOSCOPIC TRANSPHENOIDAL RESECTION OF PITUITARY TUMOR;  Surgeon: Augusto Blonder, MD;  Location: [facility] OR;  Service: Neurosurgery;  Laterality: N/A;   SINUS ENDO WITH FUSION N/A 01/11/2020   Procedure: SINUS ENDO WITH FUSION;  Surgeon: Ammon Bales, MD;  Location: [facility] OR;  Service: ENT;  Laterality: N/A;  SINUS ENDO WITH FUSION   TRANSPHENOIDAL APPROACH EXPOSURE N/A 02/25/2023   Procedure: TRANSPHENOIDAL APPROACH EXPOSURE;  Surgeon: Daleen Dubs, DO;  Location: [facility] OR;  Service: ENT;  Laterality: N/A;  FAMILY HISTORY: family history is not on file.  SOCIAL HISTORY:  reports that she quit smoking about 4 years ago. Her  smoking use included cigarettes. She started smoking about 4 years ago. She has a 0.2 pack-year smoking history. She has been exposed to tobacco smoke. She has never used smokeless tobacco. She reports current alcohol use. She reports that she does not use drugs.  ALLERGIES: Latex and Metronidazole   MEDICATIONS:  Current Outpatient Medications  Medication Sig Dispense Refill   Levonorgestrel-Eth Estradiol  (TWIRLA) 120-30 MCG/24HR PTWK Place onto the skin.     Semaglutide -Weight Management (WEGOVY ) 1.7 MG/0.75ML SOAJ Inject 1.7 mg into the skin once a week. 3 mL 2   No current facility-administered medications for this encounter.    REVIEW OF SYSTEMS:  As above.   PHYSICAL EXAM:  vitals were not taken for this visit.   General: Alert and oriented, in no acute distress *** HEENT: Head is normocephalic. Extraocular movements are intact. Oropharynx is clear. Neck: Neck is supple, no palpable cervical or supraclavicular lymphadenopathy. Heart: Regular in rate and rhythm with no murmurs, rubs, or gallops. Chest: Clear to auscultation bilaterally, with no rhonchi, wheezes, or rales. Abdomen: Soft, nontender, nondistended, with no rigidity or guarding. Extremities: No cyanosis or edema. Lymphatics: see Neck Exam Skin: No concerning lesions. Musculoskeletal: symmetric strength and muscle tone throughout. Neurologic: Cranial nerves II through XII are grossly intact. No obvious focalities. Speech is fluent. Coordination is intact. Psychiatric: Judgment and insight are intact. Affect is appropriate.   LABORATORY DATA:  Lab Results  Component Value Date   WBC 7.0 02/25/2023   HGB 12.7 02/25/2023   HCT 37.9 02/25/2023   MCV 87.3 02/25/2023   PLT 346 02/25/2023   CMP     Component Value Date/Time   NA 138 02/26/2023 0637   NA 142 03/09/2018 1604   K 4.9 02/26/2023 0637   CL 103 02/26/2023 0637   CO2 25 02/26/2023 0637   GLUCOSE 111 (H) 02/26/2023 0637   BUN 6 02/26/2023 0637   BUN 7  03/09/2018 1604   CREATININE 0.70 02/26/2023 0637   CALCIUM 8.5 (L) 02/26/2023 0637   PROT 7.6 03/09/2018 1604   ALBUMIN  3.7 01/15/2020 0216   ALBUMIN  4.5 03/09/2018 1604   AST 25 03/09/2018 1604   ALT 14 03/09/2018 1604   ALKPHOS 68 03/09/2018 1604   BILITOT 0.3 03/09/2018 1604   GFRNONAA >60 02/26/2023 0637         RADIOGRAPHY: No results found.    IMPRESSION/PLAN: This is a very pleasant *** year old *** with metastatic disease to the brain.  I had a lengthy discussion with the patient after reviewing their MRI results with them.  We spoke about whole brain radiotherapy versus stereotactic radiosurgery to the brain. We spoke about the differing risks benefits and side effects of both of these treatments. During part of our discussion, we spoke about the hair loss, fatigue and cognitive effects that can result from whole brain radiotherapy.  Additionally, we spoke about radionecrosis that can result from stereotactic radiosurgery. I explained that whole brain radiotherapy is more comprehensive and therefore can decrease the chance of recurrences elsewhere in the brain, while stereotactic radiosurgery only treats the areas of gross disease while sparing the rest of the brain parenchyma.  After lengthy discussion, the patient would like to proceed with stereotactic brain radiosurgery to their metastatic disease. They will meet with neurosurgery in the near future to discuss this further; a neurosurgeon will participate in their  case.  CT simulation will take place on *** and treatment on ***.  I plan to deliver *** Gy in 1 fraction to ***.  On date of service, in total, I spent *** minutes on this encounter. Patient was seen in person.   __________________________________________   Colie Dawes, MD  This document serves as a record of services personally performed by Colie Dawes, MD. It was created on her behalf by Lucky Sable, a trained medical scribe. The creation of this record  is based on the scribe's personal observations and the provider's statements to them. This document has been checked and approved by the attending provider.

## 2024-03-27 ENCOUNTER — Other Ambulatory Visit: Payer: Self-pay

## 2024-03-27 ENCOUNTER — Ambulatory Visit
Admission: RE | Admit: 2024-03-27 | Discharge: 2024-03-27 | Disposition: A | Discharge status: Home / Self Care | Source: Ambulatory Visit | Attending: Radiation Oncology | Admitting: Radiation Oncology

## 2024-03-27 ENCOUNTER — Encounter: Payer: Self-pay | Admitting: Radiation Oncology

## 2024-03-27 ENCOUNTER — Encounter: Payer: Self-pay | Admitting: *Deleted

## 2024-03-27 VITALS — BP 106/86 | HR 68 | Temp 97.0°F | Resp 18 | Ht 65.0 in | Wt 237.8 lb

## 2024-03-27 DIAGNOSIS — Z803 Family history of malignant neoplasm of breast: Secondary | ICD-10-CM | POA: Diagnosis not present

## 2024-03-27 DIAGNOSIS — D352 Benign neoplasm of pituitary gland: Secondary | ICD-10-CM

## 2024-03-27 DIAGNOSIS — Z8041 Family history of malignant neoplasm of ovary: Secondary | ICD-10-CM | POA: Diagnosis not present

## 2024-03-27 DIAGNOSIS — E230 Hypopituitarism: Secondary | ICD-10-CM | POA: Insufficient documentation

## 2024-03-27 DIAGNOSIS — Z87891 Personal history of nicotine dependence: Secondary | ICD-10-CM | POA: Diagnosis not present

## 2024-03-27 DIAGNOSIS — E039 Hypothyroidism, unspecified: Secondary | ICD-10-CM | POA: Insufficient documentation

## 2024-03-27 DIAGNOSIS — Z793 Long term (current) use of hormonal contraceptives: Secondary | ICD-10-CM | POA: Insufficient documentation

## 2024-03-28 ENCOUNTER — Ambulatory Visit (INDEPENDENT_AMBULATORY_CARE_PROVIDER_SITE_OTHER): Admitting: Student

## 2024-03-28 ENCOUNTER — Encounter: Payer: Self-pay | Admitting: Radiation Therapy

## 2024-03-28 ENCOUNTER — Encounter: Payer: Self-pay | Admitting: Student

## 2024-03-28 ENCOUNTER — Other Ambulatory Visit (HOSPITAL_COMMUNITY)
Admission: RE | Admit: 2024-03-28 | Discharge: 2024-03-28 | Disposition: A | Discharge status: Home / Self Care | Source: Ambulatory Visit | Attending: Family Medicine | Admitting: Family Medicine

## 2024-03-28 DIAGNOSIS — R109 Unspecified abdominal pain: Secondary | ICD-10-CM | POA: Insufficient documentation

## 2024-03-28 LAB — POCT URINALYSIS DIP (MANUAL ENTRY)
Blood, UA: NEGATIVE
Glucose, UA: NEGATIVE mg/dL
Ketones, POC UA: NEGATIVE mg/dL
Leukocytes, UA: NEGATIVE
Nitrite, UA: NEGATIVE
Spec Grav, UA: >=1.03 — AB (ref 1.010–1.025)
Urobilinogen, UA: 0.2 U/dL
pH, UA: 5.5 (ref 5.0–8.0)

## 2024-03-28 LAB — POCT WET PREP (WET MOUNT)
Clue Cells Wet Prep Whiff POC: NEGATIVE
Trichomonas Wet Prep HPF POC: ABSENT

## 2024-03-28 NOTE — Progress Notes (Addendum)
    SUBJECTIVE:   CHIEF COMPLAINT / HPI:   Virginia Cardenas is a 38 year old female with a history of UTIs who presents with concerns for a urinary tract infection.  She has experienced symptoms for a week and a half, including clear discharge with odor and significant unilateral flank pain. She typically manages UTIs with over-the-counter treatments but seeks medical attention when pain is severe. She denies recent sexual activity, fever, nausea, or vomiting. Pain is primarily on one side of her back, and her big toe is numb. She has not taken any medication for the pain due to a strong aversion to medication. She also has a history of a pituitary tumor affecting her menstrual cycle, with no period since the tumor's recurrence.   PERTINENT  PMH / PSH: Reviewed   OBJECTIVE:   BP 106/81   Pulse 76   Temp 97.7 F (36.5 C)   Ht 5' 5 (1.651 m)   Wt 237 lb 3.2 oz (107.6 kg)   SpO2 99%   BMI 39.47 kg/m    Physical Exam General: Alert, well appearing, NAD Cardiovascular: RRR, No Murmurs, Normal S2/S2 Respiratory: CTAB, No wheezing or Rales Abdomen: No distension or tenderness Extremities: No signs of deformity in the right too. Sensation intact and LE NVI bilaterally MSK: Mild right lover back pain, negative CVA tenderness. Genitalia:  Normal introitus for age, no external lesions, white copious vaginal discharge, mucosa pink and moist, no vaginal or cervical lesions, no vaginal atrophy, no friaility or hemorrhage  CMA Virginia Cardenas served as chaperone for exam.  ASSESSMENT/PLAN:   Vaginal Discharge Clear discharge with odor for 1.5 weeks, likely bacterial vaginosis or infection.  - Perform swab for wet prep and STI testing.  Flank Pain Unilateral back pain, history of UTI.  Urinalysis was negative Suspect possible musculoskeletal process given nature of pain and physical exam findings. - Obtain UTI  - Commend Tylenol  or ibuprofen  for pain relief. - Provided work note    Virginia Last, MD [facility]

## 2024-03-28 NOTE — Patient Instructions (Addendum)
 Pleasure to meet you today.  Today we collected urine sample to test you for UTI. Your UTI test was negative. I suspect the main is a muscle strain and I recommend use of tylenol  or ibuprofen  for pain.   We also collected labs to check for any vaginal infection including BV or yeast infection.

## 2024-03-28 NOTE — Progress Notes (Signed)
 Per Dr. Barrie Borer request, I have reached out to Dr. Antoniette Klein office to get pt plugged back in with endocrinology and have also set patient up for 1 month post op visual field and basic eye exam on 06/13/2024 with Dr. Roslynn Coombes at [facility].    Axel Bohr R.T(R)(T) Radiation Special Procedures Lead

## 2024-03-29 ENCOUNTER — Other Ambulatory Visit: Payer: Self-pay | Admitting: Radiation Therapy

## 2024-03-29 DIAGNOSIS — E230 Hypopituitarism: Secondary | ICD-10-CM | POA: Diagnosis not present

## 2024-03-29 DIAGNOSIS — R7303 Prediabetes: Secondary | ICD-10-CM | POA: Diagnosis not present

## 2024-03-29 DIAGNOSIS — E049 Nontoxic goiter, unspecified: Secondary | ICD-10-CM | POA: Diagnosis not present

## 2024-03-29 DIAGNOSIS — E039 Hypothyroidism, unspecified: Secondary | ICD-10-CM | POA: Diagnosis not present

## 2024-03-29 DIAGNOSIS — D352 Benign neoplasm of pituitary gland: Secondary | ICD-10-CM | POA: Diagnosis not present

## 2024-03-29 DIAGNOSIS — N946 Dysmenorrhea, unspecified: Secondary | ICD-10-CM | POA: Diagnosis not present

## 2024-03-29 DIAGNOSIS — L83 Acanthosis nigricans: Secondary | ICD-10-CM | POA: Diagnosis not present

## 2024-03-29 LAB — CERVICOVAGINAL ANCILLARY ONLY
Chlamydia: NEGATIVE
Comment: NEGATIVE
Comment: NEGATIVE
Comment: NORMAL
Neisseria Gonorrhea: NEGATIVE
Trichomonas: NEGATIVE

## 2024-03-30 ENCOUNTER — Ambulatory Visit: Payer: Self-pay | Admitting: Student

## 2024-04-01 ENCOUNTER — Other Ambulatory Visit: Payer: Self-pay | Admitting: Student

## 2024-04-26 DIAGNOSIS — E039 Hypothyroidism, unspecified: Secondary | ICD-10-CM | POA: Diagnosis not present

## 2024-04-27 NOTE — Progress Notes (Signed)
 Surgical Instructions    Your procedure is scheduled on May 04, 2024.  Report to [facility] Main Entrance A at 5:30 A.M., then check in with the Admitting office.  Call this number if you have problems the morning of surgery:  [phone]  If you have any questions prior to your surgery date call [phone]: Open Monday-Friday 8am-4pm If you experience any cold or flu symptoms such as cough, fever, chills, shortness of breath, etc. between now and your scheduled surgery, please notify us  at the above number.     Remember:  Do not eat or drink after midnight the night before your surgery      Take these medicines the morning of surgery with A SIP OF WATER   acetaminophen  (TYLENOL )   Semaglutide -Weight Management (WEGOVY ) Last dose 04-27-24   As of today, STOP taking any Aspirin (unless otherwise instructed by your surgeon) Aleve , Naproxen , Ibuprofen , Motrin , Advil , Goody's, BC's, all herbal medications, fish oil, and all vitamins.                     Do NOT Smoke (Tobacco/Vaping) for 24 hours prior to your procedure.  If you use a CPAP at night, you may bring your mask/headgear for your overnight stay.   Contacts, glasses, piercing's, hearing aid's, dentures or partials may not be worn into surgery, please bring cases for these belongings.    For patients admitted to the hospital, discharge time will be determined by your treatment team.   Patients discharged the day of surgery will not be allowed to drive home, and someone needs to stay with them for 24 hours.  SURGICAL WAITING ROOM VISITATION Patients having surgery or a procedure may have no more than 2 support people in the waiting area - these visitors may rotate.   Children under the age of 41 must have an adult with them who is not the patient. If the patient needs to stay at the hospital during part of their recovery, the visitor guidelines for inpatient rooms apply. Pre-op nurse will coordinate an appropriate time  for 1 support person to accompany patient in pre-op.  This support person may not rotate.   Please refer to the [facility] website for the visitor guidelines for Inpatients (after your surgery is over and you are in a regular room).    Special instructions:   [facility]- Preparing For Surgery  Before surgery, you can play an important role. Because skin is not sterile, your skin needs to be as free of germs as possible. You can reduce the number of germs on your skin by washing with CHG (chlorahexidine gluconate) Soap before surgery.  CHG is an antiseptic cleaner which kills germs and bonds with the skin to continue killing germs even after washing.    Oral Hygiene is also important to reduce your risk of infection.  Remember - BRUSH YOUR TEETH THE MORNING OF SURGERY WITH YOUR REGULAR TOOTHPASTE  Please do not use if you have an allergy to CHG or antibacterial soaps. If your skin becomes reddened/irritated stop using the CHG.  Do not shave (including legs and underarms) for at least 48 hours prior to first CHG shower. It is OK to shave your face.  Please follow these instructions carefully.   Shower the NIGHT BEFORE SURGERY and the MORNING OF SURGERY  If you chose to wash your hair, wash your hair first as usual with your normal shampoo.  After you shampoo, rinse your hair and body thoroughly to remove  the shampoo.  Use CHG Soap as you would any other liquid soap. You can apply CHG directly to the skin and wash gently with a scrungie or a clean washcloth.   Apply the CHG Soap to your body ONLY FROM THE NECK DOWN.  Do not use on open wounds or open sores. Avoid contact with your eyes, ears, mouth and genitals (private parts). Wash Face and genitals (private parts)  with your normal soap.   Wash thoroughly, paying special attention to the area where your surgery will be performed.  Thoroughly rinse your body with warm water  from the neck down.  DO NOT shower/wash with your normal soap  after using and rinsing off the CHG Soap.  Pat yourself dry with a CLEAN TOWEL.  Wear CLEAN PAJAMAS to bed the night before surgery  Place CLEAN SHEETS on your bed the night before your surgery  DO NOT SLEEP WITH PETS.   Day of Surgery: Take a shower with CHG soap. Do not wear jewelry or makeup Do not wear lotions, powders, perfumes/colognes, or deodorant. Do not shave 48 hours prior to surgery.  Men may shave face and neck. Do not bring valuables to the hospital.  [facility] is not responsible for any belongings or valuables. Do not wear nail polish, gel polish, artificial nails, or any other type of covering on natural nails (fingers and toes) If you have artificial nails or gel coating that need to be removed by a nail salon, please have this removed prior to surgery. Artificial nails or gel coating may interfere with anesthesia's ability to adequately monitor your vital signs. Wear Clean/Comfortable clothing the morning of surgery Remember to brush your teeth WITH YOUR REGULAR TOOTHPASTE.   Please read over the following fact sheets that you were given.    If you received a COVID test during your pre-op visit  it is requested that you wear a mask when out in public, stay away from anyone that may not be feeling well and notify your surgeon if you develop symptoms. If you have been in contact with anyone that has tested positive in the last 10 days please notify you surgeon.

## 2024-04-30 ENCOUNTER — Other Ambulatory Visit: Payer: Self-pay

## 2024-04-30 ENCOUNTER — Encounter (HOSPITAL_COMMUNITY): Payer: Self-pay

## 2024-04-30 ENCOUNTER — Encounter (HOSPITAL_COMMUNITY)
Admission: RE | Admit: 2024-04-30 | Discharge: 2024-04-30 | Disposition: A | Discharge status: Home / Self Care | Source: Ambulatory Visit | Attending: Neurosurgery | Admitting: Neurosurgery

## 2024-04-30 VITALS — BP 110/77 | HR 74 | Temp 98.2°F | Resp 17 | Ht 65.0 in | Wt 231.6 lb

## 2024-04-30 DIAGNOSIS — Z01812 Encounter for preprocedural laboratory examination: Secondary | ICD-10-CM | POA: Insufficient documentation

## 2024-04-30 DIAGNOSIS — Z01818 Encounter for other preprocedural examination: Secondary | ICD-10-CM

## 2024-04-30 DIAGNOSIS — D352 Benign neoplasm of pituitary gland: Secondary | ICD-10-CM | POA: Diagnosis not present

## 2024-04-30 LAB — CBC
HCT: 37.6 % (ref 36.0–46.0)
Hemoglobin: 12.5 g/dL (ref 12.0–15.0)
MCH: 29.1 pg (ref 26.0–34.0)
MCHC: 33.2 g/dL (ref 30.0–36.0)
MCV: 87.6 fL (ref 80.0–100.0)
Platelets: 383 K/uL (ref 150–400)
RBC: 4.29 MIL/uL (ref 3.87–5.11)
RDW: 14.3 % (ref 11.5–15.5)
WBC: 6.6 K/uL (ref 4.0–10.5)
nRBC: 0 % (ref 0.0–0.2)

## 2024-04-30 LAB — TYPE AND SCREEN
ABO/RH(D): B POS
Antibody Screen: NEGATIVE

## 2024-04-30 LAB — BASIC METABOLIC PANEL WITH GFR
Anion gap: 7 (ref 5–15)
BUN: 8 mg/dL (ref 6–20)
CO2: 26 mmol/L (ref 22–32)
Calcium: 9 mg/dL (ref 8.9–10.3)
Chloride: 103 mmol/L (ref 98–111)
Creatinine, Ser: 0.77 mg/dL (ref 0.44–1.00)
GFR, Estimated: >60 mL/min (ref >60)
Glucose, Bld: 91 mg/dL (ref 70–99)
Potassium: 3.9 mmol/L (ref 3.5–5.1)
Sodium: 136 mmol/L (ref 135–145)

## 2024-04-30 NOTE — Progress Notes (Signed)
 Surgical Instructions    Your procedure is scheduled on May 04, 2024.  Report to [facility] Main Entrance A at 5:30 A.M., then check in with the Admitting office.  Call this number if you have problems the morning of surgery:  [phone]  If you have any questions prior to your surgery date call [phone]: Open Monday-Friday 8am-4pm If you experience any cold or flu symptoms such as cough, fever, chills, shortness of breath, etc. between now and your scheduled surgery, please notify us  at the above number.     Remember:  Do not eat or drink after midnight the night before your surgery      Take these medicines the morning of surgery with A SIP OF WATER   acetaminophen  (TYLENOL )   Semaglutide -Weight Management (WEGOVY ) Last dose 04-19-24   As of today, STOP taking any Aspirin (unless otherwise instructed by your surgeon) Aleve , Naproxen , Ibuprofen , Motrin , Advil , Goody's, BC's, all herbal medications, fish oil, and all vitamins.                     Do NOT Smoke (Tobacco/Vaping) for 24 hours prior to your procedure.  If you use a CPAP at night, you may bring your mask/headgear for your overnight stay.   Contacts, glasses, piercing's, hearing aid's, dentures or partials may not be worn into surgery, please bring cases for these belongings.    For patients admitted to the hospital, discharge time will be determined by your treatment team.   Patients discharged the day of surgery will not be allowed to drive home, and someone needs to stay with them for 24 hours.  SURGICAL WAITING ROOM VISITATION Patients having surgery or a procedure may have no more than 2 support people in the waiting area - these visitors may rotate.   Children under the age of 74 must have an adult with them who is not the patient. If the patient needs to stay at the hospital during part of their recovery, the visitor guidelines for inpatient rooms apply. Pre-op nurse will coordinate an appropriate time  for 1 support person to accompany patient in pre-op.  This support person may not rotate.   Please refer to the [facility] website for the visitor guidelines for Inpatients (after your surgery is over and you are in a regular room).    Special instructions:   [place]- Preparing For Surgery  Before surgery, you can play an important role. Because skin is not sterile, your skin needs to be as free of germs as possible. You can reduce the number of germs on your skin by washing with CHG (chlorahexidine gluconate) Soap before surgery.  CHG is an antiseptic cleaner which kills germs and bonds with the skin to continue killing germs even after washing.    Oral Hygiene is also important to reduce your risk of infection.  Remember - BRUSH YOUR TEETH THE MORNING OF SURGERY WITH YOUR REGULAR TOOTHPASTE  Please do not use if you have an allergy to CHG or antibacterial soaps. If your skin becomes reddened/irritated stop using the CHG.  Do not shave (including legs and underarms) for at least 48 hours prior to first CHG shower. It is OK to shave your face.  Please follow these instructions carefully.   Shower the NIGHT BEFORE SURGERY and the MORNING OF SURGERY  If you chose to wash your hair, wash your hair first as usual with your normal shampoo.  After you shampoo, rinse your hair and body thoroughly to remove  the shampoo.  Use CHG Soap as you would any other liquid soap. You can apply CHG directly to the skin and wash gently with a scrungie or a clean washcloth.   Apply the CHG Soap to your body ONLY FROM THE NECK DOWN.  Do not use on open wounds or open sores. Avoid contact with your eyes, ears, mouth and genitals (private parts). Wash Face and genitals (private parts)  with your normal soap.   Wash thoroughly, paying special attention to the area where your surgery will be performed.  Thoroughly rinse your body with warm water  from the neck down.  DO NOT shower/wash with your normal soap  after using and rinsing off the CHG Soap.  Pat yourself dry with a CLEAN TOWEL.  Wear CLEAN PAJAMAS to bed the night before surgery  Place CLEAN SHEETS on your bed the night before your surgery  DO NOT SLEEP WITH PETS.   Day of Surgery: Take a shower with CHG soap. Do not wear jewelry or makeup Do not wear lotions, powders, perfumes/colognes, or deodorant. Do not shave 48 hours prior to surgery.  Men may shave face and neck. Do not bring valuables to the hospital.  [facility] is not responsible for any belongings or valuables. Do not wear nail polish, gel polish, artificial nails, or any other type of covering on natural nails (fingers and toes) If you have artificial nails or gel coating that need to be removed by a nail salon, please have this removed prior to surgery. Artificial nails or gel coating may interfere with anesthesia's ability to adequately monitor your vital signs. Wear Clean/Comfortable clothing the morning of surgery Remember to brush your teeth WITH YOUR REGULAR TOOTHPASTE.   Please read over the following fact sheets that you were given.    If you received a COVID test during your pre-op visit  it is requested that you wear a mask when out in public, stay away from anyone that may not be feeling well and notify your surgeon if you develop symptoms. If you have been in contact with anyone that has tested positive in the last 10 days please notify you surgeon.

## 2024-04-30 NOTE — Progress Notes (Signed)
 PCP - Dr. Penne Rhein Cardiologist - denies Endocrinologist- Dr. Reyes Alexander  PPM/ICD - denies   Chest x-ray - 01/10/24 EKG - n/a Stress Test - denies ECHO - denies Cardiac Cath - denies  Sleep Study - denies   DM- denies Pt takes Wegovy  for wt loss. She took her last dose 7/3 and knows to continue holding until after surgery  Last dose of GLP1 agonist-  04/19/24   ASA/Blood Thinner Instructions: n/a   ERAS Protcol - no, NPO   COVID TEST- n/a   Anesthesia review: yes, difficult airway  Patient denies shortness of breath, fever, cough and chest pain at PAT appointment   All instructions explained to the patient, with a verbal understanding of the material. Patient agrees to go over the instructions while at home for a better understanding.  The opportunity to ask questions was provided.

## 2024-05-02 NOTE — Anesthesia Preprocedure Evaluation (Signed)
 Anesthesia Evaluation  Patient identified by MRN, date of birth, ID band Patient awake    Reviewed: Allergy & Precautions, NPO status , Patient's Chart, lab work & pertinent test results  History of Anesthesia Complications Negative for: history of anesthetic complications  Airway Mallampati: II  TM Distance: >3 FB Neck ROM: Full    Dental  (+) Dental Advisory Given   Pulmonary Patient abstained from smoking., former smoker   breath sounds clear to auscultation       Cardiovascular negative cardio ROS  Rhythm:Regular Rate:Normal     Neuro/Psych  Headaches Vision changes Pituitary adenoma    GI/Hepatic negative GI ROS, Neg liver ROS,,,  Endo/Other  Semaglutide : last 04/19/2024 Pituitary adenoma BMI 38.5  Renal/GU negative Renal ROS     Musculoskeletal   Abdominal   Peds  Hematology Hb 12.5, plt 383k   Anesthesia Other Findings   Reproductive/Obstetrics                              Anesthesia Physical Anesthesia Plan  ASA: 3  Anesthesia Plan: General   Post-op Pain Management: Tylenol  PO (pre-op)*   Induction: Intravenous  PONV Risk Score and Plan: 3 and Ondansetron , Dexamethasone  and Treatment may vary due to age or medical condition  Airway Management Planned: Oral ETT and Video Laryngoscope Planned  Additional Equipment: Arterial line  Intra-op Plan:   Post-operative Plan: Extubation in OR  Informed Consent: I have reviewed the patients History and Physical, chart, labs and discussed the procedure including the risks, benefits and alternatives for the proposed anesthesia with the patient or authorized representative who has indicated his/her understanding and acceptance.     Dental advisory given  Plan Discussed with: CRNA and Surgeon  Anesthesia Plan Comments: ( History of difficult airway.  Anesthesia intubation note from 02/25/2023 copied below:  Patient  Re-evaluated:Patient Re-evaluated prior to induction Oxygen Delivery Method: Circle System Utilized Preoxygenation: Pre-oxygenation with 100% oxygen Induction Type: IV induction Ventilation: Mask ventilation without difficulty Laryngoscope Size: Glidescope and 3 Grade View: Grade I Tube type: Oral Number of attempts: 2 Airway Equipment and Method: Stylet and Oral airway Placement Confirmation: ETT inserted through vocal cords under direct vision, positive ETCO2 and breath sounds checked- equal and bilateral Secured at: 22 cm Tube secured with: Tape Dental Injury: Teeth and Oropharynx as per pre-operative assessment  Difficulty Due To: Difficulty was unanticipated and Difficult Airway- due to immobile epiglottis Future Recommendations: Recommend- induction with short-acting agent, and alternative techniques readily available Comments: First attempt unable to lift epiglottis, MDA attempted with same difficulty, switched to Glide Scope, grade one view some difficulty in lifting epiglottis and small oral opening. )         Anesthesia Quick Evaluation

## 2024-05-04 ENCOUNTER — Encounter (HOSPITAL_COMMUNITY): Payer: Self-pay | Admitting: Vascular Surgery

## 2024-05-04 ENCOUNTER — Inpatient Hospital Stay (HOSPITAL_COMMUNITY)
Admission: RE | Admit: 2024-05-04 | Discharge: 2024-05-08 | DRG: 982 | Disposition: A | Discharge status: Home / Self Care | Attending: Neurosurgery | Admitting: Neurosurgery

## 2024-05-04 ENCOUNTER — Inpatient Hospital Stay (HOSPITAL_COMMUNITY)
Admission: RE | Disposition: A | Discharge status: Home / Self Care | Payer: Self-pay | Source: Home / Self Care | Attending: Neurosurgery

## 2024-05-04 ENCOUNTER — Encounter (HOSPITAL_COMMUNITY): Payer: Self-pay | Admitting: Neurosurgery

## 2024-05-04 ENCOUNTER — Other Ambulatory Visit: Payer: Self-pay

## 2024-05-04 ENCOUNTER — Inpatient Hospital Stay (HOSPITAL_COMMUNITY): Payer: Self-pay | Admitting: Anesthesiology

## 2024-05-04 DIAGNOSIS — H5340 Unspecified visual field defects: Secondary | ICD-10-CM

## 2024-05-04 DIAGNOSIS — D3A8 Other benign neuroendocrine tumors: Secondary | ICD-10-CM | POA: Diagnosis not present

## 2024-05-04 DIAGNOSIS — E7800 Pure hypercholesterolemia, unspecified: Secondary | ICD-10-CM

## 2024-05-04 DIAGNOSIS — E230 Hypopituitarism: Secondary | ICD-10-CM | POA: Diagnosis present

## 2024-05-04 DIAGNOSIS — F40240 Claustrophobia: Secondary | ICD-10-CM | POA: Diagnosis not present

## 2024-05-04 DIAGNOSIS — E232 Diabetes insipidus: Secondary | ICD-10-CM | POA: Diagnosis present

## 2024-05-04 DIAGNOSIS — F17290 Nicotine dependence, other tobacco product, uncomplicated: Secondary | ICD-10-CM | POA: Diagnosis present

## 2024-05-04 DIAGNOSIS — E2740 Unspecified adrenocortical insufficiency: Secondary | ICD-10-CM | POA: Diagnosis present

## 2024-05-04 DIAGNOSIS — E038 Other specified hypothyroidism: Secondary | ICD-10-CM | POA: Diagnosis present

## 2024-05-04 DIAGNOSIS — Z7985 Long-term (current) use of injectable non-insulin antidiabetic drugs: Secondary | ICD-10-CM | POA: Diagnosis not present

## 2024-05-04 DIAGNOSIS — D352 Benign neoplasm of pituitary gland: Principal | ICD-10-CM | POA: Diagnosis present

## 2024-05-04 DIAGNOSIS — E1365 Other specified diabetes mellitus with hyperglycemia: Secondary | ICD-10-CM | POA: Diagnosis present

## 2024-05-04 DIAGNOSIS — D649 Anemia, unspecified: Secondary | ICD-10-CM | POA: Diagnosis not present

## 2024-05-04 DIAGNOSIS — E039 Hypothyroidism, unspecified: Secondary | ICD-10-CM | POA: Diagnosis not present

## 2024-05-04 DIAGNOSIS — Z79899 Other long term (current) drug therapy: Secondary | ICD-10-CM | POA: Diagnosis not present

## 2024-05-04 DIAGNOSIS — Z881 Allergy status to other antibiotic agents status: Secondary | ICD-10-CM | POA: Diagnosis not present

## 2024-05-04 DIAGNOSIS — Z01818 Encounter for other preprocedural examination: Secondary | ICD-10-CM

## 2024-05-04 DIAGNOSIS — Z9889 Other specified postprocedural states: Secondary | ICD-10-CM | POA: Diagnosis not present

## 2024-05-04 DIAGNOSIS — I10 Essential (primary) hypertension: Secondary | ICD-10-CM | POA: Diagnosis not present

## 2024-05-04 DIAGNOSIS — Z6838 Body mass index (BMI) 38.0-38.9, adult: Secondary | ICD-10-CM

## 2024-05-04 DIAGNOSIS — Z91040 Latex allergy status: Secondary | ICD-10-CM | POA: Diagnosis not present

## 2024-05-04 HISTORY — PX: CRANIOTOMY: SHX93

## 2024-05-04 HISTORY — PX: APPLICATION OF CRANIAL NAVIGATION: SHX6578

## 2024-05-04 LAB — POCT I-STAT 7, (LYTES, BLD GAS, ICA,H+H)
Acid-base deficit: 3 mmol/L — ABNORMAL HIGH (ref 0.0–2.0)
Bicarbonate: 21.3 mmol/L (ref 20.0–28.0)
Calcium, Ion: 1.15 mmol/L (ref 1.15–1.40)
HCT: 31 % — ABNORMAL LOW (ref 36.0–46.0)
Hemoglobin: 10.5 g/dL — ABNORMAL LOW (ref 12.0–15.0)
O2 Saturation: 100 %
Patient temperature: 36.5
Potassium: 3.6 mmol/L (ref 3.5–5.1)
Sodium: 147 mmol/L — ABNORMAL HIGH (ref 135–145)
TCO2: 22 mmol/L (ref 22–32)
pCO2 arterial: 34.7 mmHg (ref 32–48)
pH, Arterial: 7.394 (ref 7.35–7.45)
pO2, Arterial: 180 mmHg — ABNORMAL HIGH (ref 83–108)

## 2024-05-04 LAB — MRSA NEXT GEN BY PCR, NASAL: MRSA by PCR Next Gen: NOT DETECTED

## 2024-05-04 LAB — POCT PREGNANCY, URINE: Preg Test, Ur: NEGATIVE

## 2024-05-04 SURGERY — CRANIOTOMY, WITH HYPOPHYSECTOMY
Anesthesia: General | Laterality: Left

## 2024-05-04 MED ORDER — CHLORHEXIDINE GLUCONATE 0.12 % MT SOLN
15.0000 mL | Freq: Once | OROMUCOSAL | Status: AC
  Administered 2024-05-04: 15 mL via OROMUCOSAL
  Filled 2024-05-04: qty 15

## 2024-05-04 MED ORDER — BUPIVACAINE HCL (PF) 0.5 % IJ SOLN
INTRAMUSCULAR | Status: DC | PRN
  Administered 2024-05-04: 7 mL

## 2024-05-04 MED ORDER — PROPOFOL 10 MG/ML IV BOLUS
INTRAVENOUS | Status: AC
  Filled 2024-05-04: qty 20

## 2024-05-04 MED ORDER — LEVETIRACETAM 500 MG/5ML IV SOLN
INTRAVENOUS | Status: AC
  Filled 2024-05-04: qty 5

## 2024-05-04 MED ORDER — LIDOCAINE-EPINEPHRINE 1 %-1:100000 IJ SOLN
INTRAMUSCULAR | Status: AC
  Filled 2024-05-04: qty 1

## 2024-05-04 MED ORDER — PHENYLEPHRINE 80 MCG/ML (10ML) SYRINGE FOR IV PUSH (FOR BLOOD PRESSURE SUPPORT)
PREFILLED_SYRINGE | INTRAVENOUS | Status: AC
  Filled 2024-05-04: qty 10

## 2024-05-04 MED ORDER — PROMETHAZINE HCL 25 MG PO TABS: 12.5000 mg | ORAL_TABLET | ORAL | Status: DC | PRN

## 2024-05-04 MED ORDER — SODIUM CHLORIDE 0.9 % IV SOLN
INTRAVENOUS | Status: DC | PRN
Start: 2024-05-04 — End: 2024-05-04

## 2024-05-04 MED ORDER — THROMBIN 5000 UNITS EX SOLR: OROMUCOSAL | Status: DC | PRN

## 2024-05-04 MED ORDER — SODIUM CHLORIDE 0.9 % IV SOLN
INTRAVENOUS | Status: DC | PRN
  Administered 2024-05-04: 1000 mg via INTRAVENOUS

## 2024-05-04 MED ORDER — LIDOCAINE 2% (20 MG/ML) 5 ML SYRINGE
INTRAMUSCULAR | Status: AC
  Filled 2024-05-04: qty 5

## 2024-05-04 MED ORDER — ACETAMINOPHEN 325 MG PO TABS
650.0000 mg | ORAL_TABLET | ORAL | Status: DC | PRN
  Administered 2024-05-04 – 2024-05-05 (×4): 650 mg via ORAL
  Filled 2024-05-04 (×5): qty 2

## 2024-05-04 MED ORDER — CEFAZOLIN SODIUM-DEXTROSE 2-4 GM/100ML-% IV SOLN
2.0000 g | Freq: Three times a day (TID) | INTRAVENOUS | Status: DC
  Administered 2024-05-04 – 2024-05-06 (×5): 2 g via INTRAVENOUS
  Filled 2024-05-04 (×5): qty 100

## 2024-05-04 MED ORDER — SODIUM CHLORIDE 0.9 % IV SOLN
0.1500 ug/kg/min | Freq: Once | INTRAVENOUS | Status: DC
  Filled 2024-05-04: qty 5000

## 2024-05-04 MED ORDER — DEXAMETHASONE SODIUM PHOSPHATE 10 MG/ML IJ SOLN
INTRAMUSCULAR | Status: AC
  Filled 2024-05-04: qty 1

## 2024-05-04 MED ORDER — PROPOFOL 500 MG/50ML IV EMUL
INTRAVENOUS | Status: DC | PRN
  Administered 2024-05-04: 75 ug/kg/min via INTRAVENOUS

## 2024-05-04 MED ORDER — PHENYLEPHRINE HCL-NACL 20-0.9 MG/250ML-% IV SOLN
INTRAVENOUS | Status: DC | PRN
  Administered 2024-05-04: 30 ug/min via INTRAVENOUS

## 2024-05-04 MED ORDER — SODIUM CHLORIDE 0.9 % IV SOLN
0.1500 ug/kg/min | INTRAVENOUS | Status: DC
  Administered 2024-05-04: .05 ug/kg/min via INTRAVENOUS
  Filled 2024-05-04: qty 5000

## 2024-05-04 MED ORDER — SUGAMMADEX SODIUM 200 MG/2ML IV SOLN
INTRAVENOUS | Status: AC
  Filled 2024-05-04: qty 2

## 2024-05-04 MED ORDER — SENNOSIDES-DOCUSATE SODIUM 8.6-50 MG PO TABS: 1.0000 | ORAL_TABLET | Freq: Every evening | ORAL | Status: DC | PRN

## 2024-05-04 MED ORDER — LABETALOL HCL 5 MG/ML IV SOLN
10.0000 mg | INTRAVENOUS | Status: DC | PRN
  Administered 2024-05-04 – 2024-05-05 (×6): 20 mg via INTRAVENOUS
  Administered 2024-05-07: 10 mg via INTRAVENOUS
  Filled 2024-05-04: qty 4
  Filled 2024-05-04 (×2): qty 8
  Filled 2024-05-04 (×2): qty 4

## 2024-05-04 MED ORDER — ORAL CARE MOUTH RINSE: 15.0000 mL | Freq: Once | OROMUCOSAL | Status: AC

## 2024-05-04 MED ORDER — CEFAZOLIN SODIUM-DEXTROSE 2-4 GM/100ML-% IV SOLN
2.0000 g | INTRAVENOUS | Status: AC
  Administered 2024-05-04 (×2): 2 g via INTRAVENOUS
  Filled 2024-05-04: qty 100

## 2024-05-04 MED ORDER — SODIUM CHLORIDE 0.9 % IV SOLN: INTRAVENOUS | Status: AC | PRN

## 2024-05-04 MED ORDER — THROMBIN 20000 UNITS EX SOLR
CUTANEOUS | Status: AC
  Filled 2024-05-04: qty 20000

## 2024-05-04 MED ORDER — PANTOPRAZOLE SODIUM 40 MG IV SOLR
40.0000 mg | Freq: Every day | INTRAVENOUS | Status: DC
  Administered 2024-05-04 – 2024-05-05 (×2): 40 mg via INTRAVENOUS
  Filled 2024-05-04 (×2): qty 10

## 2024-05-04 MED ORDER — LIDOCAINE-EPINEPHRINE 1 %-1:100000 IJ SOLN
INTRAMUSCULAR | Status: DC | PRN
  Administered 2024-05-04: 7 mL

## 2024-05-04 MED ORDER — THROMBIN 5000 UNITS EX KIT
PACK | CUTANEOUS | Status: AC
  Filled 2024-05-04: qty 1

## 2024-05-04 MED ORDER — ACETAMINOPHEN 500 MG PO TABS
1000.0000 mg | ORAL_TABLET | Freq: Once | ORAL | Status: AC
  Administered 2024-05-04: 1000 mg via ORAL
  Filled 2024-05-04: qty 2

## 2024-05-04 MED ORDER — ONDANSETRON HCL 4 MG/2ML IJ SOLN
INTRAMUSCULAR | Status: DC | PRN
  Administered 2024-05-04: 4 mg via INTRAVENOUS

## 2024-05-04 MED ORDER — SENNOSIDES-DOCUSATE SODIUM 8.6-50 MG PO TABS
1.0000 | ORAL_TABLET | Freq: Every day | ORAL | Status: DC
  Administered 2024-05-04 – 2024-05-06 (×2): 1 via ORAL
  Filled 2024-05-04 (×2): qty 1

## 2024-05-04 MED ORDER — SODIUM CHLORIDE 0.9 % IV SOLN: INTRAVENOUS | Status: DC | PRN

## 2024-05-04 MED ORDER — FENTANYL CITRATE (PF) 250 MCG/5ML IJ SOLN
INTRAMUSCULAR | Status: AC
  Filled 2024-05-04: qty 5

## 2024-05-04 MED ORDER — ROCURONIUM BROMIDE 10 MG/ML (PF) SYRINGE
PREFILLED_SYRINGE | INTRAVENOUS | Status: AC
  Filled 2024-05-04: qty 10

## 2024-05-04 MED ORDER — EPHEDRINE SULFATE-NACL 50-0.9 MG/10ML-% IV SOSY
PREFILLED_SYRINGE | INTRAVENOUS | Status: DC | PRN
  Administered 2024-05-04: 5 mg via INTRAVENOUS

## 2024-05-04 MED ORDER — LEVETIRACETAM (KEPPRA) 500 MG/5 ML ADULT IV PUSH
500.0000 mg | Freq: Two times a day (BID) | INTRAVENOUS | Status: DC
  Administered 2024-05-04 – 2024-05-06 (×4): 500 mg via INTRAVENOUS
  Filled 2024-05-04 (×4): qty 5

## 2024-05-04 MED ORDER — HYDROMORPHONE HCL 1 MG/ML IJ SOLN
0.2500 mg | INTRAMUSCULAR | Status: DC | PRN
  Administered 2024-05-04: 0.5 mg via INTRAVENOUS

## 2024-05-04 MED ORDER — CEFAZOLIN SODIUM-DEXTROSE 2-4 GM/100ML-% IV SOLN: 2.0000 g | Freq: Three times a day (TID) | INTRAVENOUS | Status: DC

## 2024-05-04 MED ORDER — MIDAZOLAM HCL 2 MG/2ML IJ SOLN: 0.5000 mg | Freq: Once | INTRAMUSCULAR | Status: DC | PRN

## 2024-05-04 MED ORDER — ORAL CARE MOUTH RINSE: 15.0000 mL | OROMUCOSAL | Status: DC | PRN

## 2024-05-04 MED ORDER — BISACODYL 10 MG RE SUPP: 10.0000 mg | Freq: Every day | RECTAL | Status: DC | PRN

## 2024-05-04 MED ORDER — ALBUMIN HUMAN 5 % IV SOLN: INTRAVENOUS | Status: DC | PRN

## 2024-05-04 MED ORDER — MIDAZOLAM HCL 2 MG/2ML IJ SOLN
INTRAMUSCULAR | Status: AC
  Filled 2024-05-04: qty 2

## 2024-05-04 MED ORDER — THROMBIN 20000 UNITS EX SOLR: CUTANEOUS | Status: DC | PRN

## 2024-05-04 MED ORDER — BUPIVACAINE HCL (PF) 0.5 % IJ SOLN
INTRAMUSCULAR | Status: AC
  Filled 2024-05-04: qty 30

## 2024-05-04 MED ORDER — OXYCODONE HCL 5 MG PO TABS: 5.0000 mg | ORAL_TABLET | Freq: Once | ORAL | Status: DC | PRN

## 2024-05-04 MED ORDER — LACTATED RINGERS IV SOLN: INTRAVENOUS | Status: DC

## 2024-05-04 MED ORDER — ONDANSETRON HCL 4 MG/2ML IJ SOLN
INTRAMUSCULAR | Status: AC
  Filled 2024-05-04: qty 2

## 2024-05-04 MED ORDER — 0.9 % SODIUM CHLORIDE (POUR BTL) OPTIME
TOPICAL | Status: DC | PRN
  Administered 2024-05-04: 2000 mL

## 2024-05-04 MED ORDER — SUGAMMADEX SODIUM 200 MG/2ML IV SOLN
INTRAVENOUS | Status: DC | PRN
  Administered 2024-05-04 (×2): 200 mg via INTRAVENOUS

## 2024-05-04 MED ORDER — CHLORHEXIDINE GLUCONATE CLOTH 2 % EX PADS
6.0000 | MEDICATED_PAD | Freq: Every day | CUTANEOUS | Status: DC
  Administered 2024-05-04 – 2024-05-08 (×4): 6 via TOPICAL

## 2024-05-04 MED ORDER — MEPERIDINE HCL 25 MG/ML IJ SOLN: 6.2500 mg | INTRAMUSCULAR | Status: DC | PRN

## 2024-05-04 MED ORDER — PROPOFOL 10 MG/ML IV BOLUS
INTRAVENOUS | Status: DC | PRN
  Administered 2024-05-04: 200 mg via INTRAVENOUS
  Administered 2024-05-04 (×4): 50 mg via INTRAVENOUS

## 2024-05-04 MED ORDER — PHENYLEPHRINE 80 MCG/ML (10ML) SYRINGE FOR IV PUSH (FOR BLOOD PRESSURE SUPPORT)
PREFILLED_SYRINGE | INTRAVENOUS | Status: DC | PRN
  Administered 2024-05-04 (×3): 40 ug via INTRAVENOUS

## 2024-05-04 MED ORDER — ONDANSETRON HCL 4 MG/2ML IJ SOLN
4.0000 mg | INTRAMUSCULAR | Status: DC | PRN
  Administered 2024-05-05: 4 mg via INTRAVENOUS
  Filled 2024-05-04: qty 2

## 2024-05-04 MED ORDER — ACETAMINOPHEN 650 MG RE SUPP: 650.0000 mg | RECTAL | Status: DC | PRN

## 2024-05-04 MED ORDER — CHLORHEXIDINE GLUCONATE CLOTH 2 % EX PADS: 6.0000 | MEDICATED_PAD | Freq: Once | CUTANEOUS | Status: DC

## 2024-05-04 MED ORDER — FENTANYL CITRATE (PF) 250 MCG/5ML IJ SOLN
INTRAMUSCULAR | Status: DC | PRN
  Administered 2024-05-04: 50 ug via INTRAVENOUS
  Administered 2024-05-04: 200 ug via INTRAVENOUS
  Administered 2024-05-04: 50 ug via INTRAVENOUS

## 2024-05-04 MED ORDER — OXYCODONE HCL 5 MG/5ML PO SOLN: 5.0000 mg | Freq: Once | ORAL | Status: DC | PRN

## 2024-05-04 MED ORDER — ROCURONIUM BROMIDE 10 MG/ML (PF) SYRINGE
PREFILLED_SYRINGE | INTRAVENOUS | Status: DC | PRN
  Administered 2024-05-04: 10 mg via INTRAVENOUS
  Administered 2024-05-04: 70 mg via INTRAVENOUS
  Administered 2024-05-04 (×2): 10 mg via INTRAVENOUS
  Administered 2024-05-04: 20 mg via INTRAVENOUS
  Administered 2024-05-04 (×2): 10 mg via INTRAVENOUS

## 2024-05-04 MED ORDER — ONDANSETRON HCL 4 MG PO TABS
4.0000 mg | ORAL_TABLET | ORAL | Status: DC | PRN
Start: 2024-05-04 — End: 2024-05-08

## 2024-05-04 MED ORDER — BACITRACIN ZINC 500 UNIT/GM EX OINT
TOPICAL_OINTMENT | CUTANEOUS | Status: AC
  Filled 2024-05-04: qty 28.35

## 2024-05-04 MED ORDER — HYDROMORPHONE HCL 1 MG/ML IJ SOLN
INTRAMUSCULAR | Status: AC
  Filled 2024-05-04: qty 1

## 2024-05-04 MED ORDER — DEXAMETHASONE SODIUM PHOSPHATE 10 MG/ML IJ SOLN
INTRAMUSCULAR | Status: DC | PRN
  Administered 2024-05-04: 10 mg via INTRAVENOUS

## 2024-05-04 MED ORDER — MORPHINE SULFATE (PF) 2 MG/ML IV SOLN
1.0000 mg | INTRAVENOUS | Status: DC | PRN
  Administered 2024-05-04 – 2024-05-05 (×2): 1 mg via INTRAVENOUS
  Administered 2024-05-05 – 2024-05-06 (×3): 2 mg via INTRAVENOUS
  Administered 2024-05-06 (×2): 1 mg via INTRAVENOUS
  Administered 2024-05-07: 2 mg via INTRAVENOUS
  Administered 2024-05-07 (×2): 1 mg via INTRAVENOUS
  Filled 2024-05-04 (×10): qty 1

## 2024-05-04 MED ORDER — HYDROCODONE-ACETAMINOPHEN 5-325 MG PO TABS
1.0000 | ORAL_TABLET | ORAL | Status: DC | PRN
  Administered 2024-05-04 – 2024-05-08 (×11): 1 via ORAL
  Filled 2024-05-04 (×11): qty 1

## 2024-05-04 MED ORDER — LIDOCAINE 2% (20 MG/ML) 5 ML SYRINGE
INTRAMUSCULAR | Status: DC | PRN
  Administered 2024-05-04: 40 mg via INTRAVENOUS

## 2024-05-04 MED ORDER — LORAZEPAM 2 MG/ML IJ SOLN
1.0000 mg | INTRAMUSCULAR | Status: DC | PRN
  Administered 2024-05-05: 1 mg via INTRAVENOUS
  Filled 2024-05-04: qty 1

## 2024-05-04 SURGICAL SUPPLY — 58 items
BAG COUNTER SPONGE SURGICOUNT (BAG) ×2 IMPLANT
BENZOIN TINCTURE PRP APPL 2/3 (GAUZE/BANDAGES/DRESSINGS) ×2 IMPLANT
BLADE SURG 11 STRL SS (BLADE) ×4 IMPLANT
BNDG GAUZE DERMACEA FLUFF 4 (GAUZE/BANDAGES/DRESSINGS) IMPLANT
BNDG STRETCH 4X75 NS LF (GAUZE/BANDAGES/DRESSINGS) IMPLANT
BUR ROUND FLUTED 5 RND (BURR) IMPLANT
BUR SPIRAL ROUTER 2.3 (BUR) IMPLANT
CANISTER SUCTION 3000ML PPV (SUCTIONS) ×2 IMPLANT
COVER BACK TABLE 60X90IN (DRAPES) IMPLANT
COVERAGE SUPPORT O-ARM STEALTH (MISCELLANEOUS) ×1 IMPLANT
DRAPE HALF SHEET 40X57 (DRAPES) ×4 IMPLANT
DRAPE INCISE IOBAN 66X45 STRL (DRAPES) ×2 IMPLANT
DRAPE MICROSCOPE SLANT 54X150 (MISCELLANEOUS) IMPLANT
DRAPE SURG 17X23 STRL (DRAPES) ×6 IMPLANT
DRSG TELFA 3X8 NADH STRL (GAUZE/BANDAGES/DRESSINGS) IMPLANT
DURAPREP 26ML APPLICATOR (WOUND CARE) ×2 IMPLANT
ELECT NDL TIP 2.8 STRL (NEEDLE) ×2 IMPLANT
ELECT NEEDLE TIP 2.8 STRL (NEEDLE) ×1 IMPLANT
ELECTRODE REM PT RTRN 9FT ADLT (ELECTROSURGICAL) ×2 IMPLANT
FEE COVERAGE SUPPORT O-ARM (MISCELLANEOUS) ×2 IMPLANT
FORCEPS BIPOLAR SPETZLER 8 1.0 (NEUROSURGERY SUPPLIES) IMPLANT
GAUZE PACKING FOLDED 2 STR (GAUZE/BANDAGES/DRESSINGS) ×2 IMPLANT
GAUZE SPONGE 2X2 8PLY STRL LF (GAUZE/BANDAGES/DRESSINGS) ×2 IMPLANT
GAUZE SPONGE 4X4 12PLY STRL (GAUZE/BANDAGES/DRESSINGS) ×2 IMPLANT
GLOVE BIOGEL PI IND STRL 7.0 (GLOVE) IMPLANT
GLOVE BIOGEL PI IND STRL 7.5 (GLOVE) ×4 IMPLANT
GLOVE ECLIPSE 7.0 STRL STRAW (GLOVE) ×2 IMPLANT
GLOVE EXAM NITRILE XL STR (GLOVE) IMPLANT
GOWN STRL REUS W/ TWL LRG LVL3 (GOWN DISPOSABLE) IMPLANT
GOWN STRL REUS W/ TWL XL LVL3 (GOWN DISPOSABLE) IMPLANT
GOWN STRL REUS W/TWL 2XL LVL3 (GOWN DISPOSABLE) ×2 IMPLANT
HEMOSTAT POWDER KIT SURGIFOAM (HEMOSTASIS) ×2 IMPLANT
HEMOSTAT SURGICEL 2X14 (HEMOSTASIS) IMPLANT
KIT BASIN OR (CUSTOM PROCEDURE TRAY) ×2 IMPLANT
KIT DRAIN CSF ACCUDRAIN (MISCELLANEOUS) IMPLANT
KIT TURNOVER KIT B (KITS) ×2 IMPLANT
KNIFE ARACHNOID DISP AM-24-S (MISCELLANEOUS) IMPLANT
MARKER SPHERE PSV REFLC NDI (MISCELLANEOUS) ×6 IMPLANT
NDL HYPO 25X1 1.5 SAFETY (NEEDLE) ×2 IMPLANT
NDL SPNL 22GX3.5 QUINCKE BK (NEEDLE) ×2 IMPLANT
NEEDLE HYPO 25X1 1.5 SAFETY (NEEDLE) ×1 IMPLANT
NEEDLE SPNL 22GX3.5 QUINCKE BK (NEEDLE) ×1 IMPLANT
NS IRRIG 1000ML POUR BTL (IV SOLUTION) ×2 IMPLANT
PACK BATTERY CMF DISP FOR DVR (ORTHOPEDIC DISPOSABLE SUPPLIES) IMPLANT
PACK LAMINECTOMY NEURO (CUSTOM PROCEDURE TRAY) ×2 IMPLANT
PAD ARMBOARD POSITIONER FOAM (MISCELLANEOUS) ×2 IMPLANT
PATTIES SURGICAL .5 X3 (DISPOSABLE) ×2 IMPLANT
PLATE DOUBLE Y CMF 6H (Plate) IMPLANT
PLATE UNIV CMF 16 2H (Plate) IMPLANT
SCREW UNIII AXS SD 1.5X4 (Screw) IMPLANT
SPONGE SURGIFOAM ABS GEL 12-7 (HEMOSTASIS) IMPLANT
STAPLER SKIN PROX 35W (STAPLE) ×2 IMPLANT
STRIP CLOSURE SKIN 1/2X4 (GAUZE/BANDAGES/DRESSINGS) ×2 IMPLANT
TOWEL GREEN STERILE (TOWEL DISPOSABLE) ×2 IMPLANT
TOWEL GREEN STERILE FF (TOWEL DISPOSABLE) ×2 IMPLANT
TRAY FOLEY MTR SLVR 16FR STAT (SET/KITS/TRAYS/PACK) ×2 IMPLANT
TUBING FEATHERFLOW (TUBING) IMPLANT
WATER STERILE IRR 1000ML POUR (IV SOLUTION) ×2 IMPLANT

## 2024-05-04 NOTE — Consult Note (Addendum)
   NAME:  Virginia Cardenas, MRN:  [id number], DOB:  [birth year], LOS: 0 ADMISSION DATE:  05/04/2024, CONSULTATION DATE:  05/04/24  REFERRING MD:  Lanis CHIEF COMPLAINT:    History of Present Illness:  Pt is a 38 yr female with a pmhx of headache, and pituitary macroadenoma with extrasellar extension, previously underwent two transsphenoidal resections with tumor reemergence. Patient presents with scheduled craniotomy for tumor resection with optic compression. PCCM consulted to assist with management while in ICU.   Pertinent  Medical History   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   Pituitary mass (HCC) 11/28/2019   Pituitary tumor 01/11/2020     Significant Hospital Events: Including procedures, antibiotic start and stop dates in addition to other pertinent events   Pituitary tumor resection with optic decompression s/p craniotomy   Interim History / Subjective:  Drowsy, following commands  Objective    Blood pressure 108/82, pulse 68, temperature 98.8 F (37.1 C), temperature source Oral, resp. rate 18, height 5' 5 (1.651 m), weight 104.8 kg, SpO2 99%.        Intake/Output Summary (Last 24 hours) at 05/04/2024 1157 Last data filed at 05/04/2024 1157 Gross per 24 hour  Intake 2000 ml  Output 2650 ml  Net -650 ml   Filed Weights   05/04/24 0544  Weight: 104.8 kg   Examination: General: young adult female, lying in icu bed on vent  HEENT: Normocephalic, kerlex coving surgical incision-PERRLA intact-3MM, Pink MM, teeth intact  CV: s1,s2, RRR, no MRG, No JVD, art line  pulm: clear, diminished, no distress on vent  Abs: bs active, soft  Extremities: no edema, no deformity, moves all extremities on command  Skin: no rash  Neuro: Rass -1, follows commands, oriented x 3, responds to painful stimuli  GU: foley cath   Resolved problem list   Assessment and Plan  Pituitary Macroadenoma w/Extrasellar Extension s/p left  Craniotomy- tumor resection/optic chiasm decompression  MRI completed on 12/26/23- noted increase in pituitary tumor with extension into the left cavernous sinus as prior, optic chiasm displaced  Unable to assess  P:  Frequent neuro checks  Will need to assess peripheral vision Continue to monitor left craniotomy surgical site, monitor fors signs of bleeding- repeat CBC in AM on 7/18  Continue Keppra 500mg  BID  Multimodal pain management- tylenol , Norco/vicodin, morphine  with bowel regimen  Maintain SBP < 150, labetalol  PRN PT/OT management when medically appropriate Continue ANCEF  for surgical prophylaxis  Will need follow up MRI   Best Practice (right click and Reselect all SmartList Selections daily)- Per Primary    Critical care time: 45 mins     Christian Da Michelle AGACNP-BC   [facility] 05/04/2024, 11:58 AM  Please see [email or web address] for pager details.  From 7A-7P if no response, please call [phone]. After hours, please call ELink [phone].

## 2024-05-04 NOTE — Progress Notes (Addendum)
 Spoke with patient about f/u MRI W/WO to evaluate tumor resection. Patient endorsed claustrophobia. eLink RN called and requested anti-anxiety for imagine. Awaiting eMD guidance.   Ativan  PRN received. MRI called and planned scan at 0015.  0500- Patient putting out large volumes of pale yellow urine. Totaling 4+ liters this shift. Patient also very thirsty. eLink MD called to ask about sending urine to lab for analysis.   0600- After discussing patient events with eLink MD, decision was made to order desmopressin  d/t DI evidenced by 5200 cc UOP this shift and urine osmo of 57.

## 2024-05-04 NOTE — Op Note (Signed)
 NEUROSURGERY OPERATIVE NOTE   PREOP DIAGNOSIS:  Recurrent pituitary tumor with extrasellar extension   POSTOP DIAGNOSIS: Same  PROCEDURE: Stereotactic left fronto-temporal craniotomy for resection of tumor Use of intraoperative microscope for microdissection  SURGEON: Dr. Gerldine Maizes, MD  ASSISTANT: Camie Pickle, PA-C  ANESTHESIA: General Endotracheal  EBL: 200cc  SPECIMENS: Suprasellar tumor for permanent pathology  DRAINS: None  COMPLICATIONS: None immediate  CONDITION: Hemodynamically stable to PACU  HISTORY: Virginia Cardenas is a 38 year old woman with a history of pituitary macroadenoma with extrasellar extension. She had undergone two previous transsphenoidal resections with recurrence. Most recent MRI has revealed significant recurrence with optic compression. Case was discussed at tumor conference where recommendation was for craniotomy for decompression of optic apparatus likely followed by RT. Risks, benefits, and alternatives to surgery all reviewed in detail with the patient and family. After all questions were answered, informed consent was obtained and witnessed.  PROCEDURE IN DETAIL: The patient was brought to the operating room. After induction of general anesthesia, the patient was positioned on the operative table in the [place] head holder in the supine position. All pressure points were meticulously padded.  Preoperative stereotactic MRI scan was Co. registered with surface anatomy and an accuracy of approximately 2.2 mm throughout the anterior cranial fossa was achieved.  I then planned out a standard curvilinear frontotemporal skin incision which was then marked out and prepped and draped in the usual sterile fashion.  After timeout was conducted, the skin incision was infiltrated with local anesthetic with epinephrine .  Patient was administered 1 g of Keppra, 10 mg of dexamethasone  and antibiotics prior to incision.  Incision was then made sharply and  carried down through the galea.  Raney clips were applied for hemostasis on the skin edges.  Bovie electrocautery was used to dissect through the periosteum as well as the superficial temporal fascia and the temporalis muscle.  A single piece myocutaneous flap was then elevated and reflected anteriorly.  High-speed drill was used to create multiple bur holes and a standard frontotemporal craniotomy flap was created and elevated.  Hemostasis on the epidural plane was secured with bipolar electrocautery and morselized Gelfoam and thrombin .  The high-speed drill was then used to drill down the lesser wing of the sphenoid to allow unobstructed access to the optic nerve and carotid arteries.  The dura was then opened in curvilinear fashion and tacked up anteriorly.  At this point the microscope was draped sterilely and the remainder of the resection was done under the microscope using microdissection technique.  Initially a subfrontal approach was employed in order to identify the olfactory nerve and the optic nerve.  Arachnoid dissection was carried out sharply along the optic nerve in order to open up the optical carotid cistern.  There was good brain relaxation with CSF release.  Arachnoid dissection was carried further medially and laterally.  The sellar based tumor was easily identified displacing the optic nerve superiorly.  I then turned attention to dissection of the sylvian fissure which was done sharply.  This allowed further elevation of the frontal lobe.  I was able to identify the internal carotid artery bifurcation including the M1 and A1 segments.  This allowed dissection over the tumor capsule underneath the optic nerve and chiasm.  I was then able to identify the anterior aspect of the chiasm as well as the contralateral optic nerve.  At this point, the tumor capsule was coagulated both medial and lateral to the left optic nerve.  The tumor was then  entered, and using a combination of curettes and  rongeur's, as well as a suction, the tumor was internally debulked.  I was then able to pull the tumor capsule inferiorly.  In a similar fashion, with periodic debulking and mobilization of the tumor capsule, I was able to dissected away from its attachment on the right side to the medial and inferior aspect of the right optic nerve.  I was also able to dissected away from the inferior aspect of the left optic nerve.  Ultimately, I was able to identify the posterior aspect of the tumor along the dorsum sella.  I did identify the pituitary stalk deviated to the right side as seen on preoperative imaging.  In this manner, the entire suprasellar portion of the tumor was resected.  There did appear to be a portion of the tumor eccentric to the left on preoperative imaging however utilizing the stereotactic system and direct visualization, there did not appear to be any tumor within the medial aspect of the temporal lobe, but rather more likely left cavernous sinus invasion with displacement of the lateral wall of the cavernous sinus.  At this point, I used multiple ring curettes in order to remove as much tumor as possible from the sella.  Once this was done, hemostasis was easily achieved with morselized Gelfoam and thrombin .  Both the right and left optic nerves were completely decompressed.  I did not see any need to open up the falciform ligament or unroofed the optic canal on the left as the compression was entirely from the sella and suprasellar portion of tumor.  Wound was again irrigated with normal saline.  No active bleeding was identified.  The dura was reapproximated with interrupted 4-0 Nurolon stitches.  A layer of Gelfoam was placed.  The bone flap was then replaced and plated with standard titanium plates and screws.  Wound was again irrigated.  Temporalis muscle and fascia were reapproximated with interrupted 0 Vicryl stitches.  The galea was reapproximated with interrupted 3-0 Vicryl stitches and the  skin was closed with staples.  Patient was then removed from the Mayfield head holder.  Bacitracin  ointment and sterile dressing was applied.  At the end of the case all sponge, needle, instrument, and cottonoid counts were correct.  Patient was then extubated and taken to the postanesthesia care unit in stable hemodynamic condition.  Gerldine Maizes, MD [facility]

## 2024-05-04 NOTE — Progress Notes (Addendum)
 eLink Physician-Brief Progress Note Patient Name: Virginia Cardenas DOB: 01-Mar-1986 MRN: [id number]   Date of Service  05/04/2024  HPI/Events of Note  Pt about to undergo MRI, with known claustrophobia.   eICU Interventions  Ativan  1mg  IV ordered to help pt tolerate procedure.  Will monitor closely.         Keirsten Matuska M DELA CRUZ 05/04/2024, 11:05 PM  6:09 AM PT reported to have >5L UO since 7pm, with 737ml/hr UO over the past 2 hours.  NA 145, Uosm 57 Placed order to start desmopressin  1mg  IV and D5W.  Will continue to follow serial UO, Uosm

## 2024-05-04 NOTE — H&P (Signed)
 Chief Complaint   Pituitary tumor  History of Present Illness  Virginia Cardenas is a 38 year old woman with a history of pituitary macroadenoma with extrasellar extension. She had undergone two previous transsphenoidal resections with recurrence. Most recent MRI has revealed significant recurrence with optic compression. Since her last visit I have had a chance to review her case at the multidisciplinary Neuro-Oncology conference. She presents for craniotomy for resection of tumor, decompression of optic apparatus.   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   Pituitary mass (HCC) 11/28/2019   Pituitary tumor 01/11/2020    Past Surgical History   Past Surgical History:  Procedure Laterality Date   CESAREAN SECTION     CRANIOTOMY N/A 01/11/2020   Procedure: ENDOSCOPIC TRANSNASAL TRANSPEHENOIDAL RESECTION OF PITUITARY TUMOR;  Surgeon: Lanis Pupa, MD;  Location: [facility] OR;  Service: Neurosurgery;  Laterality: N/A;  ENDOSCOPIC TRANSNASAL TRANSPEHENOIDAL RESECTION OF PITUITARY TUMOR   CRANIOTOMY N/A 02/25/2023   Procedure: ENDOSCOPIC TRANSPHENOIDAL RESECTION OF PITUITARY TUMOR;  Surgeon: Lanis Pupa, MD;  Location: [facility] OR;  Service: Neurosurgery;  Laterality: N/A;   SINUS ENDO WITH FUSION N/A 01/11/2020   Procedure: SINUS ENDO WITH FUSION;  Surgeon: Mable Lenis, MD;  Location: [facility] OR;  Service: ENT;  Laterality: N/A;  SINUS ENDO WITH FUSION   TRANSPHENOIDAL APPROACH EXPOSURE N/A 02/25/2023   Procedure: TRANSPHENOIDAL APPROACH EXPOSURE;  Surgeon: Llewellyn Gerard LABOR, DO;  Location: [facility] OR;  Service: ENT;  Laterality: N/A;    Social History   Social History   Tobacco Use   Smoking status: Former    Current packs/day: 0.00    Average packs/day: 0.5 packs/day for 0.5 years (0.2 ttl pk-yrs)    Types: Cigarettes    Start date: 06/2019    Quit date: 12/2019    Years since quitting: 4.3    Passive exposure:  Current   Smokeless tobacco: Never  Vaping Use   Vaping status: Every Day  Substance Use Topics   Alcohol use: Yes    Comment: occassional, very rarely   Drug use: No    Medications   Prior to Admission medications   Medication Sig Start Date End Date Taking? Authorizing Provider  acetaminophen  (TYLENOL ) 500 MG tablet Take 500-1,000 mg by mouth every 6 (six) hours as needed (headaches.).   Yes [provider]  Semaglutide -Weight Management (WEGOVY ) 1.7 MG/0.75ML SOAJ INJECT 1.7 MG INTO THE SKIN ONCE A WEEK Patient taking differently: Inject 1.7 mg into the skin every Thursday. 04/04/24   Jennelle Riis, MD    Allergies   Allergies  Allergen Reactions   Latex Rash   Metronidazole  Rash    See photo of rash in office visit note from 06/01/16    Review of Systems  ROS  Neurologic Exam  Awake, alert, oriented Memory and concentration grossly intact Speech fluent, appropriate CN grossly intact Motor exam: Upper Extremities Deltoid Bicep Tricep Grip  Right 5/5 5/5 5/5 5/5  Left 5/5 5/5 5/5 5/5   Lower Extremities IP Quad PF DF EHL  Right 5/5 5/5 5/5 5/5 5/5  Left 5/5 5/5 5/5 5/5 5/5   Sensation grossly intact to LT  Imaging  MRI reveals large sellar tumor with left-sided and suprasellar extension with compression of the optic chiasm  Impression  - 38 y.o. female with second recurrence of pituitary tumor with extrasellar extension. Case was discussed at multidisciplinary conference and recommendation was for surgery for decompression of optic  structures followed likely by radiation treatment.  Plan  - Will proceed with left craniotomy for resection of tumor  I have reviewed the indications for the procedure as well as the details of the procedure and the expected postoperative course and recovery at length with the patient and family in the office. We have also reviewed in detail the risks, benefits, and alternatives to the procedure. All questions were  answered and Virginia Cardenas provided informed consent to proceed.  Virginia Maizes, MD [facility]

## 2024-05-04 NOTE — Anesthesia Procedure Notes (Signed)
 Arterial Line Insertion Start/End7/18/2025 7:20 AM, 05/04/2024 7:25 AM Performed by: Emmitt Millman, CRNA, CRNA  Patient location: Pre-op. Preanesthetic checklist: patient identified, IV checked, site marked, risks and benefits discussed, surgical consent, monitors and equipment checked, pre-op evaluation, timeout performed and anesthesia consent Lidocaine  1% used for infiltration Right, radial was placed Catheter size: 20 G Hand hygiene performed , maximum sterile barriers used  and Seldinger technique used Allen's test indicative of satisfactory collateral circulation Attempts: 1 Procedure performed using ultrasound guided technique. Ultrasound Notes:anatomy identified, needle tip was noted to be adjacent to the nerve/plexus identified and no ultrasound evidence of intravascular and/or intraneural injection Following insertion, dressing applied. Post procedure assessment: normal and unchanged  Patient tolerated the procedure well with no immediate complications.

## 2024-05-04 NOTE — Anesthesia Postprocedure Evaluation (Signed)
 Anesthesia Post Note  Patient: Virginia Cardenas  Procedure(s) Performed: CRANIOTOMY FOR RESECTION OF TUMOR (Left) COMPUTER-ASSISTED NAVIGATION, FOR CRANIAL PROCEDURE (Left)     Patient location during evaluation: PACU Anesthesia Type: General Level of consciousness: awake and alert, oriented and patient cooperative Pain management: pain level controlled Vital Signs Assessment: post-procedure vital signs reviewed and stable Respiratory status: spontaneous breathing, nonlabored ventilation and respiratory function stable Cardiovascular status: blood pressure returned to baseline and stable Postop Assessment: no apparent nausea or vomiting Anesthetic complications: no   No notable events documented.  Last Vitals:  Vitals:   05/04/24 1400 05/04/24 1407  BP: 125/89   Pulse: 91 84  Resp: 15 15  Temp:    SpO2: 100% 98%    Last Pain:  Vitals:   05/04/24 1407  TempSrc:   PainSc: Asleep                 Klein Willcox,E. Cady Hafen

## 2024-05-04 NOTE — Anesthesia Procedure Notes (Signed)
 Procedure Name: Intubation Date/Time: 05/04/2024 8:32 AM  Performed by: Emmitt Millman, CRNAPre-anesthesia Checklist: Patient identified, Emergency Drugs available, Suction available and Patient being monitored Patient Re-evaluated:Patient Re-evaluated prior to induction Oxygen Delivery Method: Circle system utilized Preoxygenation: Pre-oxygenation with 100% oxygen Induction Type: IV induction Ventilation: Mask ventilation without difficulty and Oral airway inserted - appropriate to patient size Laryngoscope Size: Glidescope and 4 Grade View: Grade I Tube type: Oral Tube size: 7.0 mm Number of attempts: 1 Airway Equipment and Method: Stylet and Oral airway Placement Confirmation: ETT inserted through vocal cords under direct vision, positive ETCO2 and breath sounds checked- equal and bilateral Secured at: 22 cm Tube secured with: Tape Dental Injury: Teeth and Oropharynx as per pre-operative assessment

## 2024-05-04 NOTE — Transfer of Care (Signed)
 Immediate Anesthesia Transfer of Care Note  Patient: Virginia Cardenas  Procedure(s) Performed: CRANIOTOMY FOR RESECTION OF TUMOR (Left) COMPUTER-ASSISTED NAVIGATION, FOR CRANIAL PROCEDURE (Left)  Patient Location: PACU  Anesthesia Type:General  Level of Consciousness: drowsy  Airway & Oxygen Therapy: Patient Spontanous Breathing  Post-op Assessment: Report given to RN and Post -op Vital signs reviewed and stable  Post vital signs: Reviewed and stable  Last Vitals:  Vitals Value Taken Time  BP 108/76 05/04/24 13:16  Temp    Pulse 86 05/04/24 13:19  Resp 16 05/04/24 13:19  SpO2 92 % 05/04/24 13:19  Vitals shown include unfiled device data.  Last Pain:  Vitals:   05/04/24 0620  TempSrc:   PainSc: 0-No pain         Complications: No notable events documented.

## 2024-05-05 ENCOUNTER — Inpatient Hospital Stay (HOSPITAL_COMMUNITY)

## 2024-05-05 DIAGNOSIS — E232 Diabetes insipidus: Secondary | ICD-10-CM | POA: Diagnosis not present

## 2024-05-05 DIAGNOSIS — D352 Benign neoplasm of pituitary gland: Secondary | ICD-10-CM | POA: Diagnosis not present

## 2024-05-05 DIAGNOSIS — E039 Hypothyroidism, unspecified: Secondary | ICD-10-CM | POA: Diagnosis not present

## 2024-05-05 DIAGNOSIS — Z9889 Other specified postprocedural states: Secondary | ICD-10-CM | POA: Diagnosis not present

## 2024-05-05 DIAGNOSIS — E2740 Unspecified adrenocortical insufficiency: Secondary | ICD-10-CM | POA: Diagnosis not present

## 2024-05-05 DIAGNOSIS — H5340 Unspecified visual field defects: Secondary | ICD-10-CM | POA: Diagnosis not present

## 2024-05-05 DIAGNOSIS — G936 Cerebral edema: Secondary | ICD-10-CM | POA: Diagnosis not present

## 2024-05-05 DIAGNOSIS — E230 Hypopituitarism: Secondary | ICD-10-CM | POA: Diagnosis not present

## 2024-05-05 DIAGNOSIS — I6200 Nontraumatic subdural hemorrhage, unspecified: Secondary | ICD-10-CM | POA: Diagnosis not present

## 2024-05-05 LAB — CBC
HCT: 33.8 % — ABNORMAL LOW (ref 36.0–46.0)
Hemoglobin: 11.2 g/dL — ABNORMAL LOW (ref 12.0–15.0)
MCH: 29 pg (ref 26.0–34.0)
MCHC: 33.1 g/dL (ref 30.0–36.0)
MCV: 87.6 fL (ref 80.0–100.0)
Platelets: 361 K/uL (ref 150–400)
RBC: 3.86 MIL/uL — ABNORMAL LOW (ref 3.87–5.11)
RDW: 14.2 % (ref 11.5–15.5)
WBC: 13.3 K/uL — ABNORMAL HIGH (ref 4.0–10.5)
nRBC: 0 % (ref 0.0–0.2)

## 2024-05-05 LAB — BASIC METABOLIC PANEL WITH GFR
Anion gap: 10 (ref 5–15)
BUN: <5 mg/dL — ABNORMAL LOW (ref 6–20)
CO2: 22 mmol/L (ref 22–32)
Calcium: 9.2 mg/dL (ref 8.9–10.3)
Chloride: 114 mmol/L — ABNORMAL HIGH (ref 98–111)
Creatinine, Ser: 0.82 mg/dL (ref 0.44–1.00)
GFR, Estimated: >60 mL/min (ref >60)
Glucose, Bld: 151 mg/dL — ABNORMAL HIGH (ref 70–99)
Potassium: 3.5 mmol/L (ref 3.5–5.1)
Sodium: 146 mmol/L — ABNORMAL HIGH (ref 135–145)

## 2024-05-05 LAB — OSMOLALITY, URINE
Osmolality, Ur: 57 mosm/kg — ABNORMAL LOW (ref 300–900)
Osmolality, Ur: 697 mosm/kg (ref 300–900)
Osmolality, Ur: 697 mosm/kg (ref 300–900)
Osmolality, Ur: 391 mosm/kg (ref 300–900)

## 2024-05-05 LAB — NA AND K (SODIUM & POTASSIUM), RAND UR
Potassium Urine: 4 mmol/L
Sodium, Ur: 14 mmol/L

## 2024-05-05 LAB — CORTISOL-AM, BLOOD: Cortisol - AM: 0.5 ug/dL — ABNORMAL LOW (ref 6.7–22.6)

## 2024-05-05 LAB — SODIUM, URINE, RANDOM: Sodium, Ur: 53 mmol/L

## 2024-05-05 LAB — PHOSPHORUS: Phosphorus: 3.4 mg/dL (ref 2.5–4.6)

## 2024-05-05 LAB — HEMOGLOBIN A1C
Hgb A1c MFr Bld: 4.8 % (ref 4.8–5.6)
Mean Plasma Glucose: 91.06 mg/dL

## 2024-05-05 LAB — MAGNESIUM: Magnesium: 2.1 mg/dL (ref 1.7–2.4)

## 2024-05-05 LAB — GLUCOSE, CAPILLARY
Glucose-Capillary: 92 mg/dL (ref 70–99)
Glucose-Capillary: 148 mg/dL — ABNORMAL HIGH (ref 70–99)
Glucose-Capillary: 151 mg/dL — ABNORMAL HIGH (ref 70–99)

## 2024-05-05 LAB — PROTEIN, URINE, RANDOM: Total Protein, Urine: <6 mg/dL

## 2024-05-05 LAB — SODIUM
Sodium: 149 mmol/L — ABNORMAL HIGH (ref 135–145)
Sodium: 143 mmol/L (ref 135–145)
Sodium: 138 mmol/L (ref 135–145)

## 2024-05-05 LAB — CREATININE, URINE, RANDOM: Creatinine, Urine: 12 mg/dL

## 2024-05-05 MED ORDER — GADOBUTROL 1 MMOL/ML IV SOLN
10.0000 mL | Freq: Once | INTRAVENOUS | Status: AC | PRN
  Administered 2024-05-05: 10 mL via INTRAVENOUS

## 2024-05-05 MED ORDER — INSULIN ASPART 100 UNIT/ML IJ SOLN
0.0000 [IU] | INTRAMUSCULAR | Status: DC
  Administered 2024-05-05: 2 [IU] via SUBCUTANEOUS
  Administered 2024-05-05 – 2024-05-06 (×2): 1 [IU] via SUBCUTANEOUS
  Administered 2024-05-06 (×2): 2 [IU] via SUBCUTANEOUS
  Administered 2024-05-06: 1 [IU] via SUBCUTANEOUS

## 2024-05-05 MED ORDER — POTASSIUM CHLORIDE CRYS ER 20 MEQ PO TBCR
40.0000 meq | EXTENDED_RELEASE_TABLET | Freq: Once | ORAL | Status: AC
  Administered 2024-05-05: 40 meq via ORAL
  Filled 2024-05-05: qty 2

## 2024-05-05 MED ORDER — DESMOPRESSIN ACETATE 4 MCG/ML IJ SOLN
1.0000 ug | Freq: Once | INTRAMUSCULAR | Status: AC
  Administered 2024-05-05: 1 ug via INTRAVENOUS
  Filled 2024-05-05: qty 1

## 2024-05-05 MED ORDER — DEXTROSE 5 % IV SOLN: INTRAVENOUS | Status: AC

## 2024-05-05 MED ORDER — HYDROCORTISONE SOD SUC (PF) 100 MG IJ SOLR
100.0000 mg | Freq: Three times a day (TID) | INTRAMUSCULAR | Status: DC
  Administered 2024-05-05 – 2024-05-06 (×3): 100 mg via INTRAVENOUS
  Filled 2024-05-05 (×3): qty 2

## 2024-05-05 MED ORDER — LOSARTAN POTASSIUM 50 MG PO TABS
25.0000 mg | ORAL_TABLET | Freq: Every day | ORAL | Status: DC
  Administered 2024-05-05 – 2024-05-07 (×3): 25 mg via ORAL
  Filled 2024-05-05 (×4): qty 1

## 2024-05-05 NOTE — Progress Notes (Signed)
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  UOSM before DDAVP  = 57, UOSM after DDAVP  = 697   Will continue to follow   Rankin Sams, PharmD, BCPS, BCCCP Clinical Pharmacist

## 2024-05-05 NOTE — Progress Notes (Signed)
 NAME:  Virginia Cardenas, MRN:  [id number], DOB:  03-30-1986, LOS: 1 ADMISSION DATE:  05/04/2024, CONSULTATION DATE:  05/04/24  REFERRING MD:  Lanis CHIEF COMPLAINT:    History of Present Illness:  Pt is a 38 yr female with a pmhx of headache, and pituitary macroadenoma with extrasellar extension, previously underwent two transsphenoidal resections with tumor reemergence. Patient presents with scheduled craniotomy for tumor resection with optic compression. PCCM consulted to assist with management while in ICU.   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 She had large volume of dilute urine- over 5L overnight. Received 1 dose DDAVP .   Objective    Blood pressure 110/78, pulse 73, temperature 98.8 F (37.1 C), temperature source Oral, resp. rate 15, height 5' 5 (1.651 m), weight 104.8 kg, SpO2 98%.        Intake/Output Summary (Last 24 hours) at 05/05/2024 0729 Last data filed at 05/05/2024 0700 Gross per 24 hour  Intake 4208.9 ml  Output 8175 ml  Net -3966.1 ml   Filed Weights   05/04/24 0544  Weight: 104.8 kg   Examination: General: young woman sleeping in bed in NAD HEENT: surgical dressing intact. CV: S1S2, RRR  pulm: breathing comfortably on RA, CTAB. Abs: soft, nontender, nondistended Extremities: No peripheral edema Neuro: sleeping but arouses to stimulation, able to lift all extremities off the bed.  Answering some questions but falls back asleep quickly. 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  Checking thyroid  function tomorrow.  Needs stress dose steroids. Insulin  PRN for hyperglycemia.  -Strict I's/O, continue Foley today given diabetes insipidus overnight. - Postop pain control-acetaminophen , Norco, morphine  as needed -Continue Keppra  prophylaxis -PT, OT  Diabetes insipidus with hypernatremia -Continue D5W - Likely will need additional doses of DDAVP ; con't urine osm & Na+ monitoring. 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  Anemia - Transfuse for hemoglobin less than 7 or hemodynamically significant bleeding - Monitor  Ms. Rolph's mother was updated at bedside this morning.   Best Practice (right click and Reselect all SmartList Selections daily)- Per Primary    Critical care time:     This patient is critically ill with multiple organ system failure which requires frequent high complexity decision making, assessment, support, evaluation, and titration of therapies. This was completed through the application of advanced monitoring technologies and extensive interpretation of multiple databases. During this encounter critical care time was devoted to patient care services described in this note for 40 minutes.  Leita SHAUNNA Gaskins,  DO 05/05/24 1:19 PM [facility]  For contact information, see  Amion. If no response to pager, please call PCCM consult pager. After hours, 7PM- 7AM, please call Elink.

## 2024-05-05 NOTE — Progress Notes (Addendum)
 eLink Physician-Brief Progress Note Patient Name: Virginia Cardenas DOB: [birth year] MRN: [id number]   Date of Service  05/05/2024  HPI/Events of Note  Notified that aline was removed BP stable, not requiring pressors  Most recent Na level 138  eICU Interventions  No need for re-insertion of aline Discussed with RN        Damien T Mateusz Neilan 05/05/2024, 10:00 PM

## 2024-05-05 NOTE — Progress Notes (Signed)
 Subjective: Patient reports nausea/vomiting this morning  Objective: Vital signs in last 24 hours: Temp:  [98.2 F (36.8 C)-100.6 F (38.1 C)] 98.8 F (37.1 C) (07/19 0400) Pulse Rate:  [73-97] 77 (07/19 0800) Resp:  [13-23] 17 (07/19 0800) BP: (90-134)/(74-102) 134/102 (07/19 0800) SpO2:  [90 %-100 %] 98 % (07/19 0800) Arterial Line BP: (116-160)/(71-98) 154/80 (07/19 0800)  Intake/Output from previous day: 07/18 0701 - 07/19 0700 In: 4208.9 [P.O.:1320; I.V.:2078.7; IV Piggyback:810.2] Out: 8205 [Urine:8005; Blood:200] Intake/Output this shift: Total I/O In: 84.9 [I.V.:84.9] Out: 60 [Urine:60]  Sleepy, pupils equally round and reactive to light.  I was not able to test visual fields effectively due to patient's sleepiness.  Follows commands x 4, oriented to self.  Head wrap in place  Lab Results: Recent Labs    05/04/24 1146 05/05/24 0427  WBC  --  13.3*  HGB 10.5* 11.2*  HCT 31.0* 33.8*  PLT  --  361   BMET Recent Labs    05/04/24 1146 05/05/24 0427 05/05/24 0604  NA 147* 146* 149*  K 3.6 3.5  --   CL  --  114*  --   CO2  --  22  --   GLUCOSE  --  151*  --   BUN  --  <5*  --   CREATININE  --  0.82  --   CALCIUM  --  9.2  --     Studies/Results: MR BRAIN W WO CONTRAST Result Date: 05/05/2024 EXAM: MRI BRAIN WITH AND WITHOUT CONTRAST 05/05/2024 01:09:44 AM TECHNIQUE: Multiplanar multisequence MRI of the head/brain was performed with and without the administration of intravenous contrast. 10mL gadobutrol  (GADAVIST ) 1 MMOL/ML injection 10 mL GADOBUTROL  1 MMOL/ML IV SOLN. COMPARISON: None available. CLINICAL HISTORY: Brain/CNS neoplasm, assess treatment response. FINDINGS: BRAIN AND VENTRICLES: Post left frontal craniotomy with overlying scalp edema and subgaleal fluid collection. Bifrontal pneumocephalus and small left anterior convexity extraaxial collection measuring 5 mm in thickness. After surgical intervention on the intrasellar mass, the solid component of  the mass has decreased in size, now measuring 3.3 x 3.9 x 2.5 cm, previously 3.4 x 3.4 x 2.7 cm. There is persistent mass effect on the optic chiasm and left greater than right optic nerve. Left cavernous sinus invasion is unchanged but the carotid flow voids are maintained. ORBITS: No acute abnormality. SINUSES: No acute abnormality. BONES AND SOFT TISSUES: Normal bone marrow signal and enhancement. No acute soft tissue abnormality. IMPRESSION: 1. Status post recent left frontal craniotomy with overlying scalp edema, subgaleal fluid collection, bifrontal pneumocephalus, and small left anterior convexity subdural hematoma measuring 5 mm in thickness. 2. Decreased solid component of the intrasellar mass, now measuring 3.3 x 3.9 x 2.5 cm (previously 3.4 x 3.4 x 2.7 cm). 3. Persistent mass effect on the optic chiasm and left greater than right optic nerve. 4. Unchanged left cavernous sinus invasion with maintained carotid flow voids. Electronically signed by: Franky Stanford MD 05/05/2024 01:24 AM EDT RP Workstation: [id number]    Assessment/Plan: This is a 38 year old woman status post craniotomy for resection of recurrent pituitary tumor.  She has hypopituitarism postop -I personally reviewed the MRI.  No significant extra-axial fluid collections or hemorrhages.  Good debulking of the pituitary mass is noted.  There is some small area of diffusion restriction in the left hypothalamus.  with the debulking, I would suspect the optic apparatus will return to its normal anatomic position over the next several weeks. - I have ordered hydrocortisone  stress dosing to be started  now - Continue to monitor for DI.  Received 1 mcg of DDAVP  this morning.  She will need sodium and urine specific gravity testing regularly. - Will check thyroid  function labs tomorrow - Continue ICU level care  Dorn KANDICE Ned 05/05/2024, 10:51 AM

## 2024-05-05 NOTE — Progress Notes (Signed)
 Arterial line positional on day shift per RN without blood return. Attempted dressing change and troubleshooting tricks without resolution. Art line removed and eLink informed.

## 2024-05-06 DIAGNOSIS — E2740 Unspecified adrenocortical insufficiency: Secondary | ICD-10-CM | POA: Diagnosis not present

## 2024-05-06 DIAGNOSIS — E230 Hypopituitarism: Secondary | ICD-10-CM

## 2024-05-06 DIAGNOSIS — E039 Hypothyroidism, unspecified: Secondary | ICD-10-CM

## 2024-05-06 DIAGNOSIS — D352 Benign neoplasm of pituitary gland: Secondary | ICD-10-CM | POA: Diagnosis not present

## 2024-05-06 DIAGNOSIS — H5340 Unspecified visual field defects: Secondary | ICD-10-CM | POA: Diagnosis not present

## 2024-05-06 DIAGNOSIS — E232 Diabetes insipidus: Secondary | ICD-10-CM | POA: Diagnosis not present

## 2024-05-06 DIAGNOSIS — Z9889 Other specified postprocedural states: Secondary | ICD-10-CM | POA: Diagnosis not present

## 2024-05-06 LAB — BASIC METABOLIC PANEL WITH GFR
Anion gap: 10 (ref 5–15)
BUN: <5 mg/dL — ABNORMAL LOW (ref 6–20)
CO2: 26 mmol/L (ref 22–32)
Calcium: 9 mg/dL (ref 8.9–10.3)
Chloride: 102 mmol/L (ref 98–111)
Creatinine, Ser: 0.74 mg/dL (ref 0.44–1.00)
GFR, Estimated: >60 mL/min (ref >60)
Glucose, Bld: 123 mg/dL — ABNORMAL HIGH (ref 70–99)
Potassium: 3.9 mmol/L (ref 3.5–5.1)
Sodium: 138 mmol/L (ref 135–145)

## 2024-05-06 LAB — GLUCOSE, CAPILLARY
Glucose-Capillary: 126 mg/dL — ABNORMAL HIGH (ref 70–99)
Glucose-Capillary: 155 mg/dL — ABNORMAL HIGH (ref 70–99)
Glucose-Capillary: 157 mg/dL — ABNORMAL HIGH (ref 70–99)
Glucose-Capillary: 120 mg/dL — ABNORMAL HIGH (ref 70–99)
Glucose-Capillary: 141 mg/dL — ABNORMAL HIGH (ref 70–99)
Glucose-Capillary: 111 mg/dL — ABNORMAL HIGH (ref 70–99)

## 2024-05-06 LAB — OSMOLALITY, URINE: Osmolality, Ur: 686 mosm/kg (ref 300–900)

## 2024-05-06 LAB — TSH: TSH: 0.27 u[IU]/mL — ABNORMAL LOW (ref 0.350–4.500)

## 2024-05-06 LAB — SODIUM
Sodium: 137 mmol/L (ref 135–145)
Sodium: 137 mmol/L (ref 135–145)
Sodium: 139 mmol/L (ref 135–145)

## 2024-05-06 LAB — T4, FREE: Free T4: 0.58 ng/dL — ABNORMAL LOW (ref 0.61–1.12)

## 2024-05-06 MED ORDER — HYDROCORTISONE SOD SUC (PF) 100 MG IJ SOLR
50.0000 mg | Freq: Three times a day (TID) | INTRAMUSCULAR | Status: DC
  Administered 2024-05-06 – 2024-05-08 (×6): 50 mg via INTRAVENOUS
  Filled 2024-05-06 (×6): qty 2

## 2024-05-06 MED ORDER — LEVETIRACETAM 500 MG PO TABS
500.0000 mg | ORAL_TABLET | Freq: Two times a day (BID) | ORAL | Status: DC
  Administered 2024-05-06 – 2024-05-08 (×4): 500 mg via ORAL
  Filled 2024-05-06 (×4): qty 1

## 2024-05-06 MED ORDER — LEVOTHYROXINE SODIUM 75 MCG PO TABS
150.0000 ug | ORAL_TABLET | Freq: Every day | ORAL | Status: DC
  Administered 2024-05-06 – 2024-05-08 (×3): 150 ug via ORAL
  Filled 2024-05-06 (×3): qty 2

## 2024-05-06 MED ORDER — DIAZEPAM 5 MG/ML IJ SOLN: 5.0000 mg | INTRAMUSCULAR | Status: DC | PRN

## 2024-05-06 MED ORDER — DESMOPRESSIN ACETATE 4 MCG/ML IJ SOLN
1.0000 ug | Freq: Once | INTRAMUSCULAR | Status: AC
  Administered 2024-05-06: 1 ug via INTRAVENOUS
  Filled 2024-05-06: qty 1

## 2024-05-06 MED ORDER — DESMOPRESSIN ACETATE 0.1 MG PO TABS
0.0500 mg | ORAL_TABLET | Freq: Two times a day (BID) | ORAL | Status: DC
  Administered 2024-05-06 – 2024-05-07 (×3): 0.05 mg via ORAL
  Filled 2024-05-06 (×5): qty 1

## 2024-05-06 MED ORDER — PANTOPRAZOLE SODIUM 40 MG PO TBEC
40.0000 mg | DELAYED_RELEASE_TABLET | Freq: Every day | ORAL | Status: DC
  Administered 2024-05-06 – 2024-05-07 (×2): 40 mg via ORAL
  Filled 2024-05-06 (×2): qty 1

## 2024-05-06 NOTE — Progress Notes (Signed)
 Subjective: Patient reports feeling much better today.  Nausea and vomiting have resolved.  She does not complain of any new visual deficits, other than her left eye being swollen.  Objective: Vital signs in last 24 hours: Temp:  [98.2 F (36.8 C)-99.5 F (37.5 C)] 98.2 F (36.8 C) (07/20 0800) Pulse Rate:  [66-91] 87 (07/20 0900) Resp:  [10-22] 15 (07/20 0900) BP: (102-132)/(64-98) 131/80 (07/20 0900) SpO2:  [90 %-100 %] 98 % (07/20 0900) Arterial Line BP: (95-159)/(78-144) 108/104 (07/19 2000)  Intake/Output from previous day: 07/19 0701 - 07/20 0700 In: 2428.7 [P.O.:240; I.V.:1878.6; IV Piggyback:310.1] Out: 2655 [Urine:2655] Intake/Output this shift: Total I/O In: 150 [I.V.:150] Out: 400 [Urine:400]  Alert, oriented x 3.  Decreased finger counting in the bitemporal fields, finger counting intact centrally and in nasal fields.  Dressing intact.  Left eye with periorbital edema.  Extraocular movements intact.  No pronator drift.  Lab Results: Recent Labs    05/04/24 1146 05/05/24 0427  WBC  --  13.3*  HGB 10.5* 11.2*  HCT 31.0* 33.8*  PLT  --  361   BMET Recent Labs    05/05/24 0427 05/05/24 0604 05/06/24 0007 05/06/24 0602  NA 146*   < > 139 138  K 3.5  --   --  3.9  CL 114*  --   --  102  CO2 22  --   --  26  GLUCOSE 151*  --   --  123*  BUN <5*  --   --  <5*  CREATININE 0.82  --   --  0.74  CALCIUM 9.2  --   --  9.0   < > = values in this interval not displayed.    Studies/Results: MR BRAIN W WO CONTRAST Result Date: 05/05/2024 EXAM: MRI BRAIN WITH AND WITHOUT CONTRAST 05/05/2024 01:09:44 AM TECHNIQUE: Multiplanar multisequence MRI of the head/brain was performed with and without the administration of intravenous contrast. 10mL gadobutrol  (GADAVIST ) 1 MMOL/ML injection 10 mL GADOBUTROL  1 MMOL/ML IV SOLN. COMPARISON: None available. CLINICAL HISTORY: Brain/CNS neoplasm, assess treatment response. FINDINGS: BRAIN AND VENTRICLES: Post left frontal craniotomy  with overlying scalp edema and subgaleal fluid collection. Bifrontal pneumocephalus and small left anterior convexity extraaxial collection measuring 5 mm in thickness. After surgical intervention on the intrasellar mass, the solid component of the mass has decreased in size, now measuring 3.3 x 3.9 x 2.5 cm, previously 3.4 x 3.4 x 2.7 cm. There is persistent mass effect on the optic chiasm and left greater than right optic nerve. Left cavernous sinus invasion is unchanged but the carotid flow voids are maintained. ORBITS: No acute abnormality. SINUSES: No acute abnormality. BONES AND SOFT TISSUES: Normal bone marrow signal and enhancement. No acute soft tissue abnormality. IMPRESSION: 1. Status post recent left frontal craniotomy with overlying scalp edema, subgaleal fluid collection, bifrontal pneumocephalus, and small left anterior convexity subdural hematoma measuring 5 mm in thickness. 2. Decreased solid component of the intrasellar mass, now measuring 3.3 x 3.9 x 2.5 cm (previously 3.4 x 3.4 x 2.7 cm). 3. Persistent mass effect on the optic chiasm and left greater than right optic nerve. 4. Unchanged left cavernous sinus invasion with maintained carotid flow voids. Electronically signed by: Franky Stanford MD 05/05/2024 01:24 AM EDT RP Workstation: [id number]    Assessment/Plan: This is a 38 year old woman status post craniotomy for recurrent pituitary adenoma.  She has panhypopituitarism postoperatively. - Will decrease her hydrocortisone  to 50 every 8 hours - Started on Synthroid  this morning by CCM  given low TSH and T4 - Will switch to oral DDAVP  0.05 mg twice daily -Will need continued sodium-monitoring as well as input and output monitoring, though we can discontinue Foley -PT OT - Can likely be downgraded once  sodium and fluid homeostasis established   Dorn KANDICE Ned 05/06/2024, 10:09 AM

## 2024-05-06 NOTE — Progress Notes (Signed)
 NAME:  Virginia Cardenas, MRN:  [id number], DOB:  05-29-1986, LOS: 2 ADMISSION DATE:  05/04/2024, CONSULTATION DATE:  05/04/24  REFERRING MD:  Lanis CHIEF COMPLAINT:    History of Present Illness:  Pt is a 38 yr female with a pmhx of headache, and pituitary macroadenoma with extrasellar extension, previously underwent two transsphenoidal resections with tumor reemergence. Patient presents with scheduled craniotomy for tumor resection with optic compression. PCCM consulted to assist with management while in ICU.   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 Ate fruit yesterday, has had minimal appetite.   Objective    Blood pressure 108/76, pulse 69, temperature 98.6 F (37 C), temperature source Oral, resp. rate 10, height 5' 5 (1.651 m), weight 104.8 kg, SpO2 99%.        Intake/Output Summary (Last 24 hours) at 05/06/2024 0749 Last data filed at 05/06/2024 0700 Gross per 24 hour  Intake 2428.67 ml  Output 2305 ml  Net 123.67 ml   Filed Weights   05/04/24 0544  Weight: 104.8 kg   Examination: General: ill appearing woman lying in bed in NAD HEENT: left incision dressed, swelling of left eye without bruising CV: S1S2, RRR pulm: breathing comfortably on RA, CTAB Abs: soft, NT Extremities: no cyanosis or edema Neuro: awake, still lethargic but answering questions. Able to move all extremities on command, lifts both arms against gravity.  GU: foley with clear light  yellow urine  Na+ 138 K+3.9 BUN <5 Cr 0.74 TSH Free T3 Free T4 0.58  Resolved problem list   Assessment and Plan  Pituitary macroadenoma w/ extrasellar extension s/p left craniotomy for tumor resection with optic chiasm decompression  MRI completed on 12/26/23- noted increase in pituitary tumor with extension into the left cavernous sinus as prior, optic chiasm displaced  -post-op care per NS -Showing signs of panhypopituitarism; needs endocrinology evaluation as OP -post-op pain control- norco, morphine  PRN - keppra  for seizure prophylaxis -PT, OT, advance mobility & diet as able  Diabetes insipidus with hypernatremia -remains off D5w; monitor sodium -start DDAVP  BID -serial sodium monitoring  Adrenal insufficiency; 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's mother was updated at bedside this morning during rounds.   Best Practice (right click and Reselect all SmartList Selections daily)- Per Primary    Critical care time:     This patient is critically ill with multiple organ system failure which requires frequent high complexity decision making, assessment, support, evaluation, and titration of therapies. This was completed through the application of advanced monitoring technologies and extensive interpretation of multiple databases. During this encounter critical care time was devoted to patient care services described in this note for 40 minutes.  Leita SHAUNNA Gaskins, DO 05/06/24 9:01 AM [facility]  For contact information, see Amion. If no response to pager, please call PCCM consult  pager. After hours, 7PM- 7AM, please call Elink.

## 2024-05-06 NOTE — Progress Notes (Addendum)
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

## 2024-05-07 ENCOUNTER — Encounter (HOSPITAL_COMMUNITY): Payer: Self-pay | Admitting: Neurosurgery

## 2024-05-07 ENCOUNTER — Inpatient Hospital Stay: Attending: Neurosurgery

## 2024-05-07 DIAGNOSIS — H5340 Unspecified visual field defects: Secondary | ICD-10-CM | POA: Diagnosis not present

## 2024-05-07 DIAGNOSIS — I10 Essential (primary) hypertension: Secondary | ICD-10-CM

## 2024-05-07 DIAGNOSIS — E232 Diabetes insipidus: Secondary | ICD-10-CM | POA: Diagnosis not present

## 2024-05-07 DIAGNOSIS — Z9889 Other specified postprocedural states: Secondary | ICD-10-CM | POA: Diagnosis not present

## 2024-05-07 DIAGNOSIS — D352 Benign neoplasm of pituitary gland: Secondary | ICD-10-CM | POA: Diagnosis not present

## 2024-05-07 LAB — BASIC METABOLIC PANEL WITH GFR
Anion gap: 11 (ref 5–15)
BUN: 5 mg/dL — ABNORMAL LOW (ref 6–20)
CO2: 26 mmol/L (ref 22–32)
Calcium: 9 mg/dL (ref 8.9–10.3)
Chloride: 101 mmol/L (ref 98–111)
Creatinine, Ser: 0.63 mg/dL (ref 0.44–1.00)
GFR, Estimated: >60 mL/min (ref >60)
Glucose, Bld: 95 mg/dL (ref 70–99)
Potassium: 4 mmol/L (ref 3.5–5.1)
Sodium: 138 mmol/L (ref 135–145)

## 2024-05-07 LAB — GLUCOSE, CAPILLARY
Glucose-Capillary: 103 mg/dL — ABNORMAL HIGH (ref 70–99)
Glucose-Capillary: 97 mg/dL (ref 70–99)
Glucose-Capillary: 116 mg/dL — ABNORMAL HIGH (ref 70–99)
Glucose-Capillary: 109 mg/dL — ABNORMAL HIGH (ref 70–99)
Glucose-Capillary: 118 mg/dL — ABNORMAL HIGH (ref 70–99)

## 2024-05-07 LAB — OSMOLALITY, URINE: Osmolality, Ur: 562 mosm/kg (ref 300–900)

## 2024-05-07 LAB — SODIUM: Sodium: 140 mmol/L (ref 135–145)

## 2024-05-07 LAB — T3, FREE: T3, Free: 1.7 pg/mL — ABNORMAL LOW (ref 2.0–4.4)

## 2024-05-07 MED ORDER — SENNOSIDES-DOCUSATE SODIUM 8.6-50 MG PO TABS
1.0000 | ORAL_TABLET | Freq: Two times a day (BID) | ORAL | Status: DC
  Administered 2024-05-07 (×2): 1 via ORAL
  Filled 2024-05-07 (×2): qty 1

## 2024-05-07 MED FILL — Thrombin For Soln 5000 Unit: CUTANEOUS | Qty: 5000 | Status: AC

## 2024-05-07 NOTE — Evaluation (Signed)
 Physical Therapy Evaluation Patient Details Name: Virginia Cardenas MRN: [id number] DOB: Dec 02, 1985 Today's Date: 05/07/2024  History of Present Illness  38 year old woman with a history of pituitary macroadenoma with extrasellar extension. She presents 05/04/24 for  left fronto-temporal craniotomy for resection of tumor, decompression of optic apparatus. Post-op Central Diabetes Insipidus, pan-hypopituitarism  Clinical Impression   Pt admitted secondary to problem above with deficits below. PTA patient lives with teenage children in a one-level home with 4 steps and rail to enter. She plans to go stay with her grandmother who lives in one-level home with ramp to enter. Pt currently requires min assist for ambulation with slight imbalance/drifting when turning head while walking. She also required assist to avoid object on her right x2. Patient had received pain medication prior to assessment and feels some unsteadiness due to meds. Anticipate patient will benefit from PT to address problems listed below. Will continue to follow acutely to maximize functional mobility, independence, and safety.  Anticipate pt will not need followup PT and may need DME for safe ambulation if this does not improve while here.          If plan is discharge home, recommend the following: A little help with walking and/or transfers;A little help with bathing/dressing/bathroom;Assistance with cooking/housework;Assist for transportation;Help with stairs or ramp for entrance   Can travel by private vehicle        Equipment Recommendations Other (comment) (continue to assess)  Recommendations for Other Services  OT consult    Functional Status Assessment Patient has had a recent decline in their functional status and demonstrates the ability to make significant improvements in function in a reasonable and predictable amount of time.     Precautions / Restrictions Precautions Precautions: Fall      Mobility  Bed  Mobility Overal bed mobility: Modified Independent             General bed mobility comments: HOB slightly elevated    Transfers Overall transfer level: Needs assistance Equipment used: 1 person hand held assist Transfers: Sit to/from Stand Sit to Stand: Contact guard assist           General transfer comment: guarding assist for safety    Ambulation/Gait Ambulation/Gait assistance: Min assist Gait Distance (Feet): 300 Feet Assistive device: 1 person hand held assist Gait Pattern/deviations: Step-through pattern, Decreased stride length, Drifts right/left   Gait velocity interpretation: 1.31 - 2.62 ft/sec, indicative of limited community ambulator   General Gait Details: slightly unsteady (?mostly due to pain meds); mild drift with assist to steady with head turns; able to increase velocity on command however does not maintain without cues  Stairs            Wheelchair Mobility     Tilt Bed    Modified Rankin (Stroke Patients Only)       Balance Overall balance assessment: Needs assistance Sitting-balance support: No upper extremity supported, Feet unsupported Sitting balance-Leahy Scale: Good     Standing balance support: No upper extremity supported Standing balance-Leahy Scale: Fair                               Pertinent Vitals/Pain Pain Assessment Pain Assessment: 0-10 Pain Score: 5  Pain Location: head Pain Descriptors / Indicators: Headache Pain Intervention(s): Limited activity within patient's tolerance, Monitored during session, Premedicated before session    Home Living Family/patient expects to be discharged to:: Private residence Living Arrangements: Children (teenagers) Available  Help at Discharge: Family;Available 24 hours/day (children, grandmother) Type of Home: House Home Access: Ramped entrance (grandmother's) Entrance Stairs-Rails: Right Entrance Stairs-Number of Steps: 4 (pt's home)   Home Layout: One  level Home Equipment: None Additional Comments: most likely will go stay with grandmother on discharge (ramp)    Prior Function Prior Level of Function : Independent/Modified Independent;Driving;Working/employed (daycare)                     Extremity/Trunk Assessment   Upper Extremity Assessment Upper Extremity Assessment: Overall WFL for tasks assessed    Lower Extremity Assessment Lower Extremity Assessment: Overall WFL for tasks assessed    Cervical / Trunk Assessment Cervical / Trunk Assessment: Other exceptions Cervical / Trunk Exceptions: overweight  Communication   Communication Communication: No apparent difficulties    Cognition Arousal: Lethargic, Suspect due to medications Behavior During Therapy: Flat affect   PT - Cognitive impairments: No apparent impairments                         Following commands: Intact       Cueing Cueing Techniques: Verbal cues     General Comments General comments (skin integrity, edema, etc.): pt reports vision is improving; has difficulty describing what she sees but appears to have decr peripheral vision as nearly running into object on her rt x2    Exercises     Assessment/Plan    PT Assessment Patient needs continued PT services  PT Problem List Decreased balance;Decreased mobility;Decreased knowledge of use of DME;Pain       PT Treatment Interventions DME instruction;Gait training;Functional mobility training;Therapeutic activities;Therapeutic exercise;Balance training;Patient/family education    PT Goals (Current goals can be found in the Care Plan section)  Acute Rehab PT Goals Patient Stated Goal: go home soon PT Goal Formulation: With patient Time For Goal Achievement: 05/21/24 Potential to Achieve Goals: Good    Frequency Min 3X/week     Co-evaluation               AM-PAC PT 6 Clicks Mobility  Outcome Measure Help needed turning from your back to your side while in a flat  bed without using bedrails?: None Help needed moving from lying on your back to sitting on the side of a flat bed without using bedrails?: None Help needed moving to and from a bed to a chair (including a wheelchair)?: A Little Help needed standing up from a chair using your arms (e.g., wheelchair or bedside chair)?: A Little Help needed to walk in hospital room?: A Little Help needed climbing 3-5 steps with a railing? : A Little 6 Click Score: 20    End of Session Equipment Utilized During Treatment: Gait belt Activity Tolerance: Patient tolerated treatment well Patient left: in chair;with call bell/phone within reach;with family/visitor present (RN agreed chair alarm not needed) Nurse Communication: Mobility status PT Visit Diagnosis: Unsteadiness on feet (R26.81)    Time: 9149-9087 PT Time Calculation (min) (ACUTE ONLY): 22 min   Charges:   PT Evaluation $PT Eval Low Complexity: 1 Low   PT General Charges $$ ACUTE PT VISIT: 1 Visit          Macario RAMAN, PT Acute Rehabilitation Services  Office [phone]   Macario SHAUNNA Soja 05/07/2024, 9:32 AM

## 2024-05-07 NOTE — Progress Notes (Signed)
   NAME:  Virginia Cardenas, MRN:  [id number], DOB:  1986/02/19, LOS: 3 ADMISSION DATE:  05/04/2024, CONSULTATION DATE:  05/04/24  REFERRING MD:  Virginia Cardenas CHIEF COMPLAINT:    History of Present Illness:  Pt is a 38 yr female with a pmhx of headache, and pituitary macroadenoma with extrasellar extension, previously underwent two transsphenoidal resections with tumor reemergence. Patient presents with scheduled craniotomy for tumor resection with optic compression. PCCM consulted to assist with management while in ICU.   Pertinent  Medical History   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   Pituitary mass (HCC) 11/28/2019   Pituitary tumor 01/11/2020     Significant Hospital Events: Including procedures, antibiotic start and stop dates in addition to other pertinent events   7/18 Craniotomy for pituitary macroadenoma resection with optic nerve decompression  7/19 required 2 doses of DDAVP  for DI, stress dose steroids started  Interim History / Subjective:  Yesterday she ate more-- mostly soft foods due to jaw pain that limited her. She walked 1 lap with walker with RN. She had a severe headache overnight.   Objective    Blood pressure 112/80, pulse 72, temperature 99.4 F (37.4 C), temperature source Oral, resp. rate 13, height 5' 5 (1.651 m), weight 104.8 kg, SpO2 99%.        Intake/Output Summary (Last 24 hours) at 05/07/2024 9281 Last data filed at 05/07/2024 0700 Gross per 24 hour  Intake 629.95 ml  Output 2525 ml  Net -1895.05 ml   Filed Weights   05/04/24 0544  Weight: 104.8 kg   Examination: General: young woman lying in bed in NAD HEENT: incision dressed, improved left eye edema CV: S1S2, RRR pulm: breathing comfortably on RA, CTAB Abs: Soft, NT Extremities: no cyanosis or edema Neuro: awake, alert, moving all extremities on command, symmetric strength  Labs pending  Resolved problem list   Assessment and  Plan  Pituitary macroadenoma w/ extrasellar extension s/p left craniotomy for tumor resection with optic chiasm decompression  MRI completed on 12/26/23- noted increase in pituitary tumor with extension into the left cavernous sinus as prior, optic chiasm displaced  -post-op care per NS -Treatment for panhypopituitarism-- on hydrocortisone , DDAVP , synthroid . Has OP appointment with endocrinology scheduled.  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   Best Practice (right click and Reselect all SmartList Selections daily)- Per Primary    Critical care time:       Virginia SHAUNNA Gaskins, DO 05/07/24 7:18 AM [facility]  For contact information, see Amion. If no response to pager, please call PCCM consult pager. After hours, 7PM- 7AM, please call Elink.

## 2024-05-07 NOTE — Progress Notes (Signed)
  NEUROSURGERY PROGRESS NOTE   Pt seen and examined. No issues overnight. Pt with mild HA currently. No new visual problems  EXAM: Temp:  [98.2 F (36.8 C)-99.8 F (37.7 C)] 98.2 F (36.8 C) (07/21 1200) Pulse Rate:  [59-85] 65 (07/21 1500) Resp:  [10-23] 18 (07/21 1500) BP: (105-147)/(72-92) 145/72 (07/21 1500) SpO2:  [94 %-100 %] 97 % (07/21 1500) Intake/Output      07/20 0701 07/21 0700 07/21 0701 07/22 0700   P.O. 480    I.V. (mL/kg) 150 (1.4)    Other     IV Piggyback     Total Intake(mL/kg) 630 (6)    Urine (mL/kg/hr) 2525 (1) 200 (0.2)   Total Output 2525 200   Net -1895.1 -200        Urine Occurrence  2 x    Awake, alert, oriented Speech fluent CN intact MAE well Wound c/d/I, telfa dressing in place  LABS: Lab Results  Component Value Date   CREATININE 0.63 05/07/2024   BUN 5 (L) 05/07/2024   NA 140 05/07/2024   K 4.0 05/07/2024   CL 101 05/07/2024   CO2 26 05/07/2024   Lab Results  Component Value Date   WBC 13.3 (H) 05/05/2024   HGB 11.2 (L) 05/05/2024   HCT 33.8 (L) 05/05/2024   MCV 87.6 05/05/2024   PLT 361 05/05/2024    IMPRESSION: - 38 y.o. female POD#3 s/p left crani for debulking of pituitary tumor, decompression of optic nerves. Appears to be panhypopit requiring exogenous steroids, synthroid , DI managed with ddAVP  0.05 PO BID  PLAN: - Cont to monitor this evening, plan on d/c tomorrow if stable with current hormone replacements including hydrocordisone 20/10, synthroid  150mcg daily, ddAVP  0.05mg  BID - Will arrange for outpatient Rad Onc f/u - will need outpatient f/u with endocrinology, Dr. Faythe Gerldine Maizes, MD [facility]

## 2024-05-07 NOTE — Plan of Care (Signed)
 A and o x 4.  MAE equally.  No new vision issues.  Tolerates diet.  Tolerates activity.  Pain controlled with po meds.

## 2024-05-07 NOTE — Progress Notes (Signed)
 eLink Physician-Brief Progress Note Patient Name: Virginia Cardenas DOB: 1986-10-14 MRN: [id number]   Date of Service  05/07/2024  HPI/Events of Note  Received request for AM labs Reviewed previous labs and abnormalities noted Reasonable to repeat AM CBC and electrolytes  eICU Interventions  CBC ordered Already has BMP ordered     Intervention Category Intermediate Interventions: Other:;Diagnostic test evaluation  Virginia Cardenas 05/07/2024, 9:38 PM

## 2024-05-07 NOTE — TOC CM/SW Note (Signed)
 Transition of Care [facility]) - Inpatient Brief Assessment   Patient Details  Name: Virginia Cardenas MRN: [id number] Date of Birth: 28-Jan-1986  Transition of Care [facility]) CM/SW Contact:    Inocente GORMAN Kindle, LCSW Phone Number: 05/07/2024, 4:26 PM   Clinical Narrative: Patient admitted from home s/p craniotomy. No current TOC needs identified at this time. Please place consult if needs arise.    Transition of Care Asessment: Insurance and Status: Insurance coverage has been reviewed Patient has primary care physician: Yes Home environment has been reviewed: From home Prior level of function:: Independent Prior/Current Home Services: No current home services Social Drivers of Health Review: SDOH reviewed no interventions necessary Readmission risk has been reviewed: Yes Transition of care needs: no transition of care needs at this time

## 2024-05-08 DIAGNOSIS — Z9889 Other specified postprocedural states: Secondary | ICD-10-CM | POA: Diagnosis not present

## 2024-05-08 DIAGNOSIS — E232 Diabetes insipidus: Secondary | ICD-10-CM | POA: Diagnosis not present

## 2024-05-08 DIAGNOSIS — D352 Benign neoplasm of pituitary gland: Secondary | ICD-10-CM | POA: Diagnosis not present

## 2024-05-08 DIAGNOSIS — H5340 Unspecified visual field defects: Secondary | ICD-10-CM | POA: Diagnosis not present

## 2024-05-08 LAB — CBC
HCT: 33.5 % — ABNORMAL LOW (ref 36.0–46.0)
Hemoglobin: 11 g/dL — ABNORMAL LOW (ref 12.0–15.0)
MCH: 29.2 pg (ref 26.0–34.0)
MCHC: 32.8 g/dL (ref 30.0–36.0)
MCV: 88.9 fL (ref 80.0–100.0)
Platelets: 383 K/uL (ref 150–400)
RBC: 3.77 MIL/uL — ABNORMAL LOW (ref 3.87–5.11)
RDW: 14.2 % (ref 11.5–15.5)
WBC: 12.4 K/uL — ABNORMAL HIGH (ref 4.0–10.5)
nRBC: 0 % (ref 0.0–0.2)

## 2024-05-08 LAB — BASIC METABOLIC PANEL WITH GFR
Anion gap: 9 (ref 5–15)
BUN: <5 mg/dL — ABNORMAL LOW (ref 6–20)
CO2: 31 mmol/L (ref 22–32)
Calcium: 9.3 mg/dL (ref 8.9–10.3)
Chloride: 101 mmol/L (ref 98–111)
Creatinine, Ser: 0.84 mg/dL (ref 0.44–1.00)
GFR, Estimated: >60 mL/min (ref >60)
Glucose, Bld: 102 mg/dL — ABNORMAL HIGH (ref 70–99)
Potassium: 4.1 mmol/L (ref 3.5–5.1)
Sodium: 141 mmol/L (ref 135–145)

## 2024-05-08 LAB — GLUCOSE, CAPILLARY: Glucose-Capillary: 108 mg/dL — ABNORMAL HIGH (ref 70–99)

## 2024-05-08 LAB — OSMOLALITY, URINE: Osmolality, Ur: 72 mosm/kg — ABNORMAL LOW (ref 300–900)

## 2024-05-08 MED ORDER — DESMOPRESSIN ACETATE 0.1 MG PO TABS
0.1000 mg | ORAL_TABLET | Freq: Two times a day (BID) | ORAL | Status: DC
  Administered 2024-05-08: 0.1 mg via ORAL
  Filled 2024-05-08: qty 1

## 2024-05-08 MED ORDER — DESMOPRESSIN ACETATE 0.1 MG PO TABS: 0.0500 mg | ORAL_TABLET | Freq: Two times a day (BID) | ORAL | 0 refills | Status: AC

## 2024-05-08 MED ORDER — LEVETIRACETAM 500 MG PO TABS: 500.0000 mg | ORAL_TABLET | Freq: Two times a day (BID) | ORAL | 0 refills | Status: AC

## 2024-05-08 MED ORDER — HYDROCODONE-ACETAMINOPHEN 5-325 MG PO TABS: 1.0000 | ORAL_TABLET | ORAL | 0 refills | Status: AC | PRN

## 2024-05-08 MED ORDER — DESMOPRESSIN ACETATE 0.1 MG PO TABS: 0.0500 mg | ORAL_TABLET | Freq: Two times a day (BID) | ORAL | Status: DC

## 2024-05-08 MED ORDER — HYDROCORTISONE 10 MG PO TABS: 10.0000 mg | ORAL_TABLET | Freq: Every day | ORAL | 0 refills | Status: AC

## 2024-05-08 MED ORDER — LEVOTHYROXINE SODIUM 150 MCG PO TABS
150.0000 ug | ORAL_TABLET | Freq: Every day | ORAL | 0 refills | Status: AC
Start: 2024-05-09 — End: ?

## 2024-05-08 MED ORDER — HYDROCORTISONE 20 MG PO TABS: 20.0000 mg | ORAL_TABLET | Freq: Every day | ORAL | Status: DC

## 2024-05-08 MED ORDER — HYDROCORTISONE 20 MG PO TABS: 20.0000 mg | ORAL_TABLET | Freq: Every day | ORAL | 0 refills | Status: AC

## 2024-05-08 MED ORDER — HYDROCORTISONE 10 MG PO TABS
10.0000 mg | ORAL_TABLET | Freq: Every day | ORAL | Status: DC
  Filled 2024-05-08: qty 1

## 2024-05-08 NOTE — Progress Notes (Signed)
 NAME:  Virginia Cardenas, MRN:  [id number], DOB:  Apr 11, 1986, LOS: 4 ADMISSION DATE:  05/04/2024, CONSULTATION DATE:  05/04/24  REFERRING MD:  Lanis CHIEF COMPLAINT:    History of Present Illness:  Pt is a 38 yr female with a pmhx of headache, and pituitary macroadenoma with extrasellar extension, previously underwent two transsphenoidal resections with tumor reemergence. Patient presents with scheduled craniotomy for tumor resection with optic compression. PCCM consulted to assist with management while in ICU.   Pertinent  Medical History   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   Pituitary mass (HCC) 11/28/2019   Pituitary tumor 01/11/2020     Significant Hospital Events: Including procedures, antibiotic start and stop dates in addition to other pertinent events   7/18 Craniotomy for pituitary macroadenoma resection with optic nerve decompression  7/19 required 2 doses of DDAVP  for DI, stress dose steroids started 7/20 started synthroid   Interim History / Subjective:  Virginia Cardenas had a headache after walking to the bathroom overnight. She had increased UOP overnight, but her mom made sure she drank juice, gatorade, etc rather than water .   Objective    Blood pressure 121/84, pulse 63, temperature 98.2 F (36.8 C), temperature source Oral, resp. rate 12, height 5' 5 (1.651 m), weight 104.8 kg, SpO2 100%.        Intake/Output Summary (Last 24 hours) at 05/08/2024 9277 Last data filed at 05/08/2024 0600 Gross per 24 hour  Intake 2280 ml  Output 3060 ml  Net -780 ml   Filed Weights   05/04/24 0544  Weight: 104.8 kg   Examination: General: ill appearing young woman lying in bed sleeping HEENT: incision dressed, compressive wrapped dressing removed. Left eye swelling improved CV: S1S2, RRR pulm: breathing comfortably on [place], CTAB Abs: soft, NT Extremities: no cyanosis or edema Neuro: arouses to stimulation, moving all  extremities, answering questions appropriately  BUN <5 Cr 0.84 WBC 12.4 H/H 11/33.5 Platelets 383  UOP 200cc during the day, 2.9L overnight  Resolved problem list   Assessment and Plan  Pituitary macroadenoma w/ extrasellar extension s/p left craniotomy for tumor resection with optic chiasm decompression  MRI completed on 12/26/23- noted increase in pituitary tumor with extension into the left cavernous sinus as prior, optic chiasm displaced  - Postop care per neurosurgery - Continue treatment for panhypopituitarism-switch to maintenance hydrocortisone  20+10 split dosing, Synthroid  150 mcg.  Increase DDAVP  given lack of efficacy the further we got from previous IV dosing. 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  Needs outpatient follow-up of potassium and renal function, although she has tolerated it well last few days.  Hyperglycemia -Sliding scale insulin  as needed - Goal blood glucose 140-180 blood  Virginia Cardenas's mother was updated at bedside this morning during rounds.  Best Practice (right click and Reselect all SmartList Selections daily)- Per Primary     Critical care time:       Leita SHAUNNA Gaskins, DO 05/08/24 9:47 AM [facility]  For contact information, see Amion. If no response to pager, please call PCCM consult pager. After hours, 7PM- 7AM, please call Elink.

## 2024-05-08 NOTE — Progress Notes (Signed)
 Notified Elink of pt's current urine osmolality of 72.  Awaiting orders.

## 2024-05-08 NOTE — Progress Notes (Signed)
  NEUROSURGERY PROGRESS NOTE   Pt seen and examined. Had increased UO overnight. Pt with minimal HA currently  EXAM: Temp:  [98 F (36.7 C)-99.3 F (37.4 C)] 99.3 F (37.4 C) (07/22 0800) Pulse Rate:  [58-76] 68 (07/22 0900) Resp:  [8-25] 19 (07/22 0900) BP: (93-161)/(51-86) 98/63 (07/22 0900) SpO2:  [94 %-100 %] 97 % (07/22 0900) Intake/Output      07/21 0701 07/22 0700 07/22 0701 07/23 0700   P.O. 2280 240   I.V. (mL/kg)     Total Intake(mL/kg) 2280 (21.8) 240 (2.3)   Urine (mL/kg/hr) 3060 (1.2)    Stool 0    Total Output 3060    Net -780 +240        Urine Occurrence 6 x 1 x   Stool Occurrence 1 x     Awake, alert, oriented Speech fluent CN intact x poor visual acuity MAE well Wound c/d/I, telfa dressing in place  LABS: Lab Results  Component Value Date   CREATININE 0.84 05/08/2024   BUN <5 (L) 05/08/2024   NA 141 05/08/2024   K 4.1 05/08/2024   CL 101 05/08/2024   CO2 31 05/08/2024   Lab Results  Component Value Date   WBC 12.4 (H) 05/08/2024   HGB 11.0 (L) 05/08/2024   HCT 33.5 (L) 05/08/2024   MCV 88.9 05/08/2024   PLT 383 05/08/2024    IMPRESSION: - 38 y.o. female POD#4 s/p left crani for debulking of pituitary tumor, decompression of optic nerves. Appears to be panhypopit requiring exogenous steroids, synthroid , DI managed with ddAVP  0.05 PO BID - Patient is able to maintain Na with oral intake of free water  despite somewhat increased UO.   PLAN: - Will d/c home today with current dosages of hydrocort /synthroid /ddAVP  - Will arrange for outpatient Rad Onc f/u - will need outpatient f/u with endocrinology, Dr. Faythe Gerldine Maizes, MD [facility]

## 2024-05-08 NOTE — Discharge Summary (Signed)
 Physician Discharge Summary  Patient ID: Virginia Cardenas MRN: [id number] DOB/AGE: 36-06-1986 36 y.o.  Admit date: 05/04/2024 Discharge date: 05/08/2024  Admission Diagnoses:  Pituitary macroadenoma  Discharge Diagnoses:  Same Principal Problem:   S/P craniotomy Active Problems:   Pituitary macroadenoma [facility])   Discharged Condition: Stable  Hospital Course:  Virginia Cardenas is a 38 y.o. female who underwent elective left craniotomy for debulking of recurrent pituitary tumor. She was at neurologic baseline postop but with panhypopituitarism requiring exogenous hydrocortisone , synthroid , and DI managed with oral water  intake and low-dose ddAVP . Her serum Na remained stable over the course of 2 days with 0.05 mg oral ddAVP . She was ambulating well, tolerating diet with HA under control with oral medication and requested d/c home.  Treatments: Surgery - Left craniotomy for debulking of pituitary tumor.  Discharge Exam: Blood pressure 98/63, pulse 68, temperature 99.3 F (37.4 C), temperature source Axillary, resp. rate 19, height 5' 5 (1.651 m), weight 104.8 kg, SpO2 97%. Awake, alert, oriented Speech fluent, appropriate CN grossly intact x poor visual acuity 5/5 BUE/BLE Wound c/d/i  Disposition: Discharge disposition: 01-Home or Self Care       Discharge Instructions     Call MD for:  redness, tenderness, or signs of infection (pain, swelling, redness, odor or green/yellow discharge around incision site)   Complete by: As directed    Call MD for:  temperature >100.4   Complete by: As directed    Diet - low sodium heart healthy   Complete by: As directed    Discharge instructions   Complete by: As directed    Walk at home as much as possible, at least 4 times / day   Increase activity slowly   Complete by: As directed    Lifting restrictions   Complete by: As directed    No lifting > 10 lbs   May shower / Bathe   Complete by: As directed    48 hours after  surgery   May walk up steps   Complete by: As directed    No dressing needed   Complete by: As directed    Other Restrictions   Complete by: As directed    No bending/twisting at waist      Allergies as of 05/08/2024       Reactions   Latex Rash   Metronidazole  Rash   See photo of rash in office visit note from 06/01/16        Medication List     TAKE these medications    acetaminophen  500 MG tablet Commonly known as: TYLENOL  Take 500-1,000 mg by mouth every 6 (six) hours as needed (headaches.).   desmopressin  0.1 MG tablet Commonly known as: DDAVP  Take 0.5 tablets (0.05 mg total) by mouth 2 (two) times daily.   HYDROcodone -acetaminophen  5-325 MG tablet Commonly known as: NORCO/VICODIN Take 1 tablet by mouth every 4 (four) hours as needed for moderate pain (pain score 4-6).   hydrocortisone  10 MG tablet Commonly known as: CORTEF  Take 1 tablet (10 mg total) by mouth daily after lunch.   hydrocortisone  20 MG tablet Commonly known as: CORTEF  Take 1 tablet (20 mg total) by mouth daily with breakfast. Start taking on: May 09, 2024   levETIRAcetam  500 MG tablet Commonly known as: KEPPRA  Take 1 tablet (500 mg total) by mouth 2 (two) times daily.   levothyroxine  150 MCG tablet Commonly known as: SYNTHROID  Take 1 tablet (150 mcg total) by mouth daily at 6 (six) AM. Start  taking on: May 09, 2024   Wegovy  1.7 MG/0.75ML Soaj Generic drug: Semaglutide -Weight Management INJECT 1.7 MG INTO THE SKIN ONCE A WEEK What changed: when to take this               Discharge Care Instructions  (From admission, onward)           Start     Ordered   05/08/24 0000  No dressing needed        05/08/24 9065            Follow-up Information     Izell Domino, MD Follow up.   Specialty: Radiation Oncology Contact information: [address] [place] [address] [phone]         Faythe Purchase, MD Follow up in 2 week(s).   Specialty:  Endocrinology Contact information: [address] [place] [address] [phone]         Lanis Pupa, MD Follow up in 2 week(s).   Specialty: Neurosurgery Why: For staple removal Contact information: [address] [place] [address] [phone]                 Signed: Pupa JAYSON Lanis 05/08/2024, 9:36 AM

## 2024-05-08 NOTE — Progress Notes (Signed)
 Physical Therapy Treatment Patient Details Name: Virginia Cardenas MRN: [id number] DOB: [birth year] Today's Date: 05/08/2024   History of Present Illness 38 year old woman with a history of pituitary macroadenoma with extrasellar extension. She presents 05/04/24 for  left fronto-temporal craniotomy for resection of tumor, decompression of optic apparatus. Post-op Central Diabetes Insipidus, pan-hypopituitarism    PT Comments  Patient sleeping on arrival. Easily awakened and eager to ambulate so she can go home today. Much more steady on her feet, especially when she recalls to walk at her normal pace. Able to perform head turns and maintain straight path. Nearly ran into object on her rt x1 with pt stopping and avoiding obstacle without cues or assist. Mother present and updated on pt's progress.     If plan is discharge home, recommend the following: A little help with walking and/or transfers;A little help with bathing/dressing/bathroom;Assistance with cooking/housework;Assist for transportation;Help with stairs or ramp for entrance   Can travel by private vehicle        Equipment Recommendations  None recommended by PT    Recommendations for Other Services OT consult     Precautions / Restrictions Precautions Precautions: Fall Recall of Precautions/Restrictions: Intact Restrictions Weight Bearing Restrictions Per Provider Order: No     Mobility  Bed Mobility Overal bed mobility: Modified Independent             General bed mobility comments: HOB slightly elevated    Transfers Overall transfer level: Needs assistance Equipment used: None Transfers: Sit to/from Stand Sit to Stand: Contact guard assist           General transfer comment: guarding assist for safety; from EOB x2, from toilet    Ambulation/Gait Ambulation/Gait assistance: Contact guard assist Gait Distance (Feet): 300 Feet Assistive device: None Gait Pattern/deviations: Step-through pattern,  Decreased stride length, Wide base of support   Gait velocity interpretation: 1.31 - 2.62 ft/sec, indicative of limited community ambulator   General Gait Details: slightly unsteady when slows down too much; better when cued to incr velocity; no drift with head turns; able to increase velocity on command however does not maintain without cues   Stairs             Wheelchair Mobility     Tilt Bed    Modified Rankin (Stroke Patients Only)       Balance Overall balance assessment: Needs assistance Sitting-balance support: No upper extremity supported, Feet unsupported Sitting balance-Leahy Scale: Good     Standing balance support: No upper extremity supported Standing balance-Leahy Scale: Fair                              Hotel manager: No apparent difficulties  Cognition Arousal: Alert Behavior During Therapy: Flat affect   PT - Cognitive impairments: No apparent impairments                         Following commands: Intact      Cueing Cueing Techniques: Verbal cues  Exercises      General Comments General comments (skin integrity, edema, etc.): Brief assessment of vision with pt having difficulty seeing in Rt visual field, especially inferiorly. Avoided all obstacles, however came close to walking into doorframe on her right (stopped herself just prior and corrected0      Pertinent Vitals/Pain Pain Assessment Pain Assessment: 0-10 Pain Score: 8  Pain Location: head Pain Descriptors / Indicators: Headache Pain Intervention(s):  Limited activity within patient's tolerance, Monitored during session, RN gave pain meds during session    Home Living                          Prior Function            PT Goals (current goals can now be found in the care plan section) Acute Rehab PT Goals Patient Stated Goal: go home soon PT Goal Formulation: With patient Time For Goal Achievement:  05/21/24 Potential to Achieve Goals: Good Progress towards PT goals: Progressing toward goals    Frequency    Min 3X/week      PT Plan      Co-evaluation              AM-PAC PT 6 Clicks Mobility   Outcome Measure  Help needed turning from your back to your side while in a flat bed without using bedrails?: None Help needed moving from lying on your back to sitting on the side of a flat bed without using bedrails?: None Help needed moving to and from a bed to a chair (including a wheelchair)?: A Little Help needed standing up from a chair using your arms (e.g., wheelchair or bedside chair)?: A Little Help needed to walk in hospital room?: A Little Help needed climbing 3-5 steps with a railing? : A Little 6 Click Score: 20    End of Session Equipment Utilized During Treatment: Gait belt Activity Tolerance: Patient tolerated treatment well Patient left: in chair;with call bell/phone within reach;with family/visitor present Nurse Communication: Mobility status PT Visit Diagnosis: Unsteadiness on feet (R26.81)     Time: 9091-9065 PT Time Calculation (min) (ACUTE ONLY): 26 min  Charges:    $Gait Training: 23-37 mins PT General Charges $$ ACUTE PT VISIT: 1 Visit                      Virginia Cardenas, PT Acute Rehabilitation Services  Office [phone]    Virginia Cardenas 05/08/2024, 9:42 AM

## 2024-05-18 DIAGNOSIS — D352 Benign neoplasm of pituitary gland: Secondary | ICD-10-CM | POA: Diagnosis not present

## 2024-05-18 DIAGNOSIS — E039 Hypothyroidism, unspecified: Secondary | ICD-10-CM | POA: Diagnosis not present

## 2024-05-18 DIAGNOSIS — L83 Acanthosis nigricans: Secondary | ICD-10-CM | POA: Diagnosis not present

## 2024-05-18 DIAGNOSIS — E049 Nontoxic goiter, unspecified: Secondary | ICD-10-CM | POA: Diagnosis not present

## 2024-05-18 DIAGNOSIS — E230 Hypopituitarism: Secondary | ICD-10-CM | POA: Diagnosis not present

## 2024-05-18 DIAGNOSIS — E232 Diabetes insipidus: Secondary | ICD-10-CM | POA: Diagnosis not present

## 2024-05-22 ENCOUNTER — Telehealth: Payer: Self-pay | Admitting: Radiation Therapy

## 2024-05-22 ENCOUNTER — Other Ambulatory Visit: Payer: Self-pay | Admitting: Radiation Therapy

## 2024-05-22 DIAGNOSIS — D352 Benign neoplasm of pituitary gland: Secondary | ICD-10-CM

## 2024-05-22 NOTE — Telephone Encounter (Signed)
 I spoke with Virginia Cardenas about her upcoming radiation treatment planning appointments and sent a detailed email with the upcoming appointments for her reference.  During our discussion she mentioned being claustrophobic. She has requested medication to help her better tolerate the brain MRI, Simulation, and  treatment appointments. She understands that a driver is necessary when taking anti-anxiety medication and feels having a driver will not be an issue for her.   We also discussed that she is currently scheduled for the SRT 5 fraction course of radiation, but there is a chance this plan will change to a 6 week course depending on the size of her post operative target and proximity to the optic chiasm and other critical structures. Her preference is the shorter course but understands the recommendation will be made for what is best for her overall treatment based on the treatment planning MRI, scheduled on 8/15.    Devere Perch R.T.(R)(T) Radiation Special Procedures Lead

## 2024-05-23 ENCOUNTER — Other Ambulatory Visit: Payer: Self-pay | Admitting: Radiology

## 2024-05-23 DIAGNOSIS — F419 Anxiety disorder, unspecified: Secondary | ICD-10-CM

## 2024-05-23 DIAGNOSIS — F40240 Claustrophobia: Secondary | ICD-10-CM

## 2024-05-23 MED ORDER — LORAZEPAM 0.5 MG PO TABS: 0.5000 mg | ORAL_TABLET | ORAL | 0 refills | Status: AC | PRN

## 2024-05-23 MED ORDER — LORAZEPAM 0.5 MG PO TABS: 0.5000 mg | ORAL_TABLET | ORAL | 0 refills | Status: DC | PRN

## 2024-05-23 NOTE — Progress Notes (Signed)
 Patient requesting anti-anxiety medication due to her history of claustrophobia. Prescription for Ativan  sent to patient's pharmacy today.    Leeroy Due, PA-C

## 2024-05-25 DIAGNOSIS — E230 Hypopituitarism: Secondary | ICD-10-CM | POA: Diagnosis not present

## 2024-05-25 DIAGNOSIS — E039 Hypothyroidism, unspecified: Secondary | ICD-10-CM | POA: Diagnosis not present

## 2024-05-25 DIAGNOSIS — E232 Diabetes insipidus: Secondary | ICD-10-CM | POA: Diagnosis not present

## 2024-05-25 DIAGNOSIS — E049 Nontoxic goiter, unspecified: Secondary | ICD-10-CM | POA: Diagnosis not present

## 2024-05-29 ENCOUNTER — Telehealth: Payer: Self-pay | Admitting: Radiation Therapy

## 2024-05-29 NOTE — Telephone Encounter (Signed)
 Spoke with Virginia Cardenas about her upcoming appointments. We discussed the brain MRI she has scheduled on Friday and that the medication for her claustrophobia should be available for pick up at her pharmacy. Virginia Cardenas wanted her to know that she may need to take two of the tabs for the desired effect, a dose of 1mg . Virginia Cardenas expressed understanding.    Devere Perch R.T(R)(T) Radiation Special Procedures Lead

## 2024-06-01 ENCOUNTER — Ambulatory Visit (HOSPITAL_COMMUNITY)
Admission: RE | Admit: 2024-06-01 | Discharge: 2024-06-01 | Disposition: A | Discharge status: Home / Self Care | Source: Ambulatory Visit | Attending: Radiation Oncology | Admitting: Radiation Oncology

## 2024-06-01 DIAGNOSIS — D352 Benign neoplasm of pituitary gland: Secondary | ICD-10-CM | POA: Diagnosis not present

## 2024-06-01 MED ORDER — GADOBUTROL 1 MMOL/ML IV SOLN
8.0000 mL | Freq: Once | INTRAVENOUS | Status: AC | PRN
  Administered 2024-06-01: 8 mL via INTRAVENOUS

## 2024-06-04 ENCOUNTER — Ambulatory Visit: Admission: RE | Admit: 2024-06-04 | Source: Ambulatory Visit

## 2024-06-04 ENCOUNTER — Inpatient Hospital Stay: Attending: Neurosurgery

## 2024-06-04 ENCOUNTER — Ambulatory Visit
Admission: RE | Admit: 2024-06-04 | Discharge: 2024-06-04 | Disposition: A | Discharge status: Home / Self Care | Source: Ambulatory Visit | Attending: Radiation Oncology | Admitting: Radiation Oncology

## 2024-06-04 ENCOUNTER — Encounter: Payer: Self-pay | Admitting: Radiation Oncology

## 2024-06-04 ENCOUNTER — Ambulatory Visit

## 2024-06-04 VITALS — BP 106/81 | HR 92 | Temp 97.6°F | Resp 18 | Ht 65.0 in | Wt 232.4 lb

## 2024-06-04 DIAGNOSIS — D352 Benign neoplasm of pituitary gland: Secondary | ICD-10-CM

## 2024-06-04 DIAGNOSIS — Z510 Encounter for antineoplastic radiation therapy: Secondary | ICD-10-CM | POA: Insufficient documentation

## 2024-06-04 NOTE — Progress Notes (Addendum)
 Radiation Oncology         [phone] ________________________________  Name: Virginia Cardenas MRN: [id number]  Date: 06/04/2024  DOB: 1985/11/20  Follow-Up Visit Note  Outpatient  CC: Lonnie Earnest, MD  Lanis Pupa, MD  Diagnosis and Prior Radiotherapy:    ICD-10-CM   1. Pituitary macroadenoma (HCC)  D35.2      CHIEF COMPLAINT: Here for follow-up to discuss treatment options  Narrative:  The patient returns today for routine follow-up.  This morning a brain tumor conference we reviewed her most recent MRI which depicts the status of her tumor after recent debulking surgery.  Although  the mass effect on her optic structures has decreased, the mass is still in very close proximity to her optic structures and still fairly large, thus warranting conventional radiation given over 6 weeks, 5 days a week, rather than high dose stereotactic radiosurgery given over much fewer treatments.    She feels overwhelmed after surgery and would like to wait a while before she returns to work.  ALLERGIES:  is allergic to latex and metronidazole .  Meds: Current Outpatient Medications  Medication Sig Dispense Refill   acetaminophen  (TYLENOL ) 500 MG tablet Take 500-1,000 mg by mouth every 6 (six) hours as needed (headaches.).     desmopressin  (DDAVP ) 0.1 MG tablet Take 0.5 tablets (0.05 mg total) by mouth 2 (two) times daily. 60 tablet 0   HYDROcodone -acetaminophen  (NORCO/VICODIN) 5-325 MG tablet Take 1 tablet by mouth every 4 (four) hours as needed for moderate pain (pain score 4-6). 30 tablet 0   hydrocortisone  (CORTEF ) 10 MG tablet Take 1 tablet (10 mg total) by mouth daily after lunch. 30 tablet 0   hydrocortisone  (CORTEF ) 20 MG tablet Take 1 tablet (20 mg total) by mouth daily with breakfast. 30 tablet 0   levETIRAcetam  (KEPPRA ) 500 MG tablet Take 1 tablet (500 mg total) by mouth 2 (two) times daily. 14 tablet 0   levothyroxine  (SYNTHROID ) 150 MCG tablet Take 1 tablet (150 mcg total)  by mouth daily at 6 (six) AM. 30 tablet 0   LORazepam  (ATIVAN ) 0.5 MG tablet Take 1 tablet (0.5 mg total) by mouth as needed for anxiety. 6 tablet 0   LORazepam  (ATIVAN ) 0.5 MG tablet Take 1 tablet (0.5 mg total) by mouth as needed for anxiety. 1 tablet 0   Semaglutide -Weight Management (WEGOVY ) 1.7 MG/0.75ML SOAJ INJECT 1.7 MG INTO THE SKIN ONCE A WEEK (Patient taking differently: Inject 1.7 mg into the skin every Thursday.) 4 mL 2   No current facility-administered medications for this encounter.    Physical Findings: The patient is in no acute distress. Patient is alert and oriented.  height is 5' 5 (1.651 m) and weight is 232 lb 6.4 oz (105.4 kg). Her temperature is 97.6 F (36.4 C). Her blood pressure is 106/81 and her pulse is 92. Her respiration is 18 and oxygen saturation is 100%. .    MSK: Ambulatory and well-nourished Neuro: Alert and oriented and answering questions appropriately  Lab Findings: Lab Results  Component Value Date   WBC 12.4 (H) 05/08/2024   HGB 11.0 (L) 05/08/2024   HCT 33.5 (L) 05/08/2024   MCV 88.9 05/08/2024   PLT 383 05/08/2024    Radiographic Findings: MR Brain W Wo Contrast Result Date: 06/01/2024 EXAM: MRI BRAIN WITH AND WITHOUT CONTRAST 06/01/2024 02:30:13 PM TECHNIQUE: Multiplanar multisequence MRI of the head/brain was performed with and without the administration of intravenous contrast. COMPARISON: MRI head 05/05/1924 and earlier. CLINICAL HISTORY: Brain/CNS neoplasm,  assess treatment response; 3T SRS Pituitary Protocol. Post OP Radiation Treatment planning scan. FINDINGS: BRAIN AND VENTRICLES: Post-surgical changes of left frontotemporal craniotomy for debulking of pituitary tumor. On the current study, the mass measures 3.8 x 3.1 cm in axial dimensions, previously measuring 3.9 x 3.0 cm. On sagittal images, the lesion measures 3.3 x 1.8 cm. There has been an annual decrease in the craniocaudal dimension of the tumor, particularly along the midline  and right aspects. Along the left aspect of the lesion, the craniocaudal dimension is similar to prior. The difference is best illustrated on series 7 image 18 of the current study compared to series 21 image 18 of the 05/05/1924 study. There has been interval decreased mass effect on the optic nerves. The optic chiasm demonstrates a thinned and irregular appearance, likely related to chronic compression. There is no definite residual mass effect on the right optic nerve. Mass effect on the left optic nerve is decreased from prior. Similar left cavernous sinus invasion. Patented flow voids are intact. There are T1 hyperintense blood products along the superior aspect of the mass, particularly at midline, likely reflecting post-treatment changes. Interval resolution of the previously noted pneumocephalus. There is an extraaxial collection over the anterior aspect of the left frontal lobe measuring up to 10 mm in thickness with mild associated mass effect. The previously noted extraaxial collection underlying the craniotomy site in the left frontotemporal convexity has decreased in size. No midline shift. ORBITS: No acute abnormality. SINUSES: No acute abnormality. BONES AND SOFT TISSUES: There are prominent left periparotid lymph nodes measuring up to 1.0 cm in short axis with additional mildly prominent lymph nodes in the occipital soft tissues, which appear slightly increased from prior. Mild dural enhancement underlying the craniotomy site. IMPRESSION: 1. Interval decrease in the craniocaudal dimension of the pituitary tumor, particularly along the midline and right aspects. T1 hyperintense blood products along the superior aspect of the mass, particularly at midline, likely reflecting post-treatment changes. 2. The optic chiasm demonstrates a thinned and irregular appearance, likely related to chronic compression. No definite residual mass effect on the right optic nerve. Mass effect on the left optic nerve is  decreased from prior. 3. Similar left cavernous sinus invasion. 4. Interval resolution of the previously noted pneumocephalus. 5. Extra-axial collection over the anterior aspect of the left frontal lobe measuring up to 10 mm in thickness with mild associated mass effect. 6. Prominent left peri-parotid lymph nodes measuring up to 1.0 cm in short axis with additional mildly prominent lymph nodes in the occipital soft tissues, slightly increased from prior. Recommend attention on follow up. Electronically signed by: Donnice Mania MD 06/01/2024 10:49 PM EDT RP Workstation: [id number]    Impression/Plan:    ICD-10-CM   1. Pituitary macroadenoma (HCC)  D35.2       Today I spoke with the patient and her family about the discussion that we had earlier today at brain tumor conference.  We reviewed her most recent MRI which depicts the status of her tumor after recent debulking surgery.  Although  the mass effect on her optic structures has decreased, the mass is still in very close proximity to her optic structures and still fairly large, thus warranting conventional radiation given over 6 weeks, 5 days a week, rather than high dose stereotactic radiosurgery given over much fewer treatments.  I acknowledged that conventional radiation is less convenient than stereotactic radiosurgery but our goal is to maintain an excellent chance of cure while minimizing the risk of severe  late side effects such as optic neuropathy.  She is agreeable with this plan and we will proceed with CT simulation today.  Consent form was previously signed and is in her chart.  It has been a month since her last urine pregnancy test.  I asked our team to arrange another urine pregnancy test before she starts radiation.  On date of service, in total, I spent 30 minutes on this encounter. Patient was seen in person.  _____________________________________   Lauraine Golden, MD

## 2024-06-05 ENCOUNTER — Other Ambulatory Visit: Payer: Self-pay | Admitting: Radiation Therapy

## 2024-06-05 ENCOUNTER — Telehealth: Payer: Self-pay | Admitting: Radiation Therapy

## 2024-06-05 DIAGNOSIS — D329 Benign neoplasm of meninges, unspecified: Secondary | ICD-10-CM

## 2024-06-05 LAB — SURGICAL PATHOLOGY

## 2024-06-05 NOTE — Telephone Encounter (Signed)
 I called to check in on Virginia Cardenas. She was a little overwhelmed learning that her treatment course had changed to 6 weeks, but she is coming to terms with this and understands the changes had her best interests in mind.   Dr. Izell has also requested a more recent pregnancy test before starting treatments on 8/26. Virginia Cardenas prefers coming in earlier on that same day. An order had been entered for a stat urine pregnancy test and she is aware to be here at 2:45.   Devere Perch R.T(R)(T) Radiation Special Procedures Lead

## 2024-06-06 ENCOUNTER — Ambulatory Visit (INDEPENDENT_AMBULATORY_CARE_PROVIDER_SITE_OTHER): Admitting: Student

## 2024-06-06 VITALS — BP 100/80 | HR 94 | Temp 98.0°F | Ht 65.0 in | Wt 231.2 lb

## 2024-06-06 DIAGNOSIS — E232 Diabetes insipidus: Secondary | ICD-10-CM

## 2024-06-06 LAB — POCT URINALYSIS DIP (MANUAL ENTRY)
Bilirubin, UA: NEGATIVE
Blood, UA: NEGATIVE
Glucose, UA: NEGATIVE mg/dL
Ketones, POC UA: NEGATIVE mg/dL
Leukocytes, UA: NEGATIVE
Nitrite, UA: NEGATIVE
Protein Ur, POC: NEGATIVE mg/dL
Spec Grav, UA: <=1.005 — AB (ref 1.010–1.025)
Urobilinogen, UA: 0.2 U/dL
pH, UA: 5.5 (ref 5.0–8.0)

## 2024-06-06 LAB — POCT URINE PREGNANCY: Preg Test, Ur: NEGATIVE

## 2024-06-06 LAB — POCT GLYCOSYLATED HEMOGLOBIN (HGB A1C): Hemoglobin A1C: 5.2 % (ref 4.0–5.6)

## 2024-06-06 NOTE — Progress Notes (Cosign Needed)
    SUBJECTIVE:   CHIEF COMPLAINT / HPI:   38 year old female history of diabetes insipidus Presenting for increased urinary frequency and thirst Recently started on desmopressin  0.5 mg BID Reports initial improvement with medication, however symptoms have gotten worse since then Denies any headache, lightheadedness or confusion. Reports mostly fatigue, increased thirst and urination Sees Endocrinologist,  Dr. Faythe S/p debulking of recurrent sellar/suprasellar microadenoma complicated by diabetes insipidus and hypothyroidism.  Labs last month showed normal sodium, and low urine osmolarity of 72  PERTINENT  PMH / PSH: Reviewed   OBJECTIVE:   BP 100/80   Pulse 94   Temp 98 F (36.7 C)   Ht 5' 5 (1.651 m)   Wt 231 lb 3.2 oz (104.9 kg)   SpO2 94%   BMI 38.47 kg/m    Physical Exam General: Alert, well appearing, NAD Cardiovascular: RRR, No Murmurs, Normal S2/S2 Respiratory: CTAB, No wheezing or Rales Abdomen: No distension or tenderness Extremities: No edema on extremities   Skin: Warm and dry  ASSESSMENT/PLAN:   Diabetes insipidus Diabetes insipidus post surgery with inadequate symptom control on desmopressin .  Endorses polyuria and polydipsia with some fatigue however no AMS, confusion or other red flag symptoms. - Recommend endocrinologist close follow-up  - Order stat BMP to assess electrolyte, Hgb A1c, UA, urine osmolality and urine sodium. - Ensure desmopressin  adherence - Discussed strict return and ED precautions  Norleen April, MD [facility]

## 2024-06-06 NOTE — Patient Instructions (Signed)
 Pleasure to see you today.  Your excessive thirst and urination is most likely due to your diabetes insipidus.  I recommend making sure you are taking your desmopressin /medication as prescribed.  Please I recommend following up with your endocrinologist to discuss possible medication adjustments or treatment options.  Today we ordered lab to check your electrolytes level.

## 2024-06-07 LAB — BASIC METABOLIC PANEL WITH GFR
BUN/Creatinine Ratio: 4 — ABNORMAL LOW (ref 9–23)
BUN: 3 mg/dL — ABNORMAL LOW (ref 6–20)
CO2: 22 mmol/L (ref 20–29)
Calcium: 9.4 mg/dL (ref 8.7–10.2)
Chloride: 105 mmol/L (ref 96–106)
Creatinine, Ser: 0.74 mg/dL (ref 0.57–1.00)
Glucose: 89 mg/dL (ref 70–99)
Potassium: 4.3 mmol/L (ref 3.5–5.2)
Sodium: 144 mmol/L (ref 134–144)
eGFR: 108 mL/min/1.73 (ref >59)

## 2024-06-07 LAB — TSH RFX ON ABNORMAL TO FREE T4: TSH: 0.038 u[IU]/mL — ABNORMAL LOW (ref 0.450–4.500)

## 2024-06-07 LAB — SODIUM, URINE, RANDOM: Sodium, Ur: <20 mmol/L

## 2024-06-07 LAB — T4F: T4,Free (Direct): 1 ng/dL (ref 0.82–1.77)

## 2024-06-08 ENCOUNTER — Encounter: Payer: Self-pay | Admitting: Student

## 2024-06-08 ENCOUNTER — Ambulatory Visit (INDEPENDENT_AMBULATORY_CARE_PROVIDER_SITE_OTHER): Admitting: Student

## 2024-06-08 ENCOUNTER — Ambulatory Visit: Admitting: Radiation Oncology

## 2024-06-08 VITALS — BP 106/85 | HR 87 | Wt 233.0 lb

## 2024-06-08 DIAGNOSIS — L309 Dermatitis, unspecified: Secondary | ICD-10-CM | POA: Diagnosis not present

## 2024-06-08 DIAGNOSIS — L700 Acne vulgaris: Secondary | ICD-10-CM | POA: Diagnosis not present

## 2024-06-08 DIAGNOSIS — E232 Diabetes insipidus: Secondary | ICD-10-CM | POA: Diagnosis not present

## 2024-06-08 LAB — OSMOLALITY, URINE: Osmolality, Ur: 63 mosm/kg

## 2024-06-08 MED ORDER — CLINDAMYCIN PHOS-BENZOYL PEROX 1-5 % EX GEL: Freq: Two times a day (BID) | CUTANEOUS | 0 refills | Status: DC

## 2024-06-08 MED ORDER — TRIAMCINOLONE ACETONIDE 0.1 % EX OINT: 1.0000 | TOPICAL_OINTMENT | Freq: Two times a day (BID) | CUTANEOUS | 0 refills | Status: DC

## 2024-06-08 NOTE — Assessment & Plan Note (Signed)
 Recently seen for polyuria and polydipsia suspected to be due to her diabetes insipidus.  Today patient clarified she ran out of her desmopressin  for weeks and has not been on her medication.  She recently spoke to her endocrinologist who refilled her medication.  We discussed her recent results including BMP which showed normal sodium. Her labs were generally unremarkable.  Patient counseled on need for medication adherence.  She verbalized understanding and amendable to plan.

## 2024-06-08 NOTE — Progress Notes (Signed)
    SUBJECTIVE:   CHIEF COMPLAINT / HPI:   38 year old female with history of diabetes insipidus presenting today for concerns of facial acne.  Per patient she has had issues with facial acne that has gotten worse in the last few months.  Previously patient reports washing her face with sponge and washcloth however in the last 2 weeks have mostly been using her fingers to wash her face.  Patient also endorses her face being mostly dry especially since her recent diagnosis of diabetes insipidus.  Does not have any skin care routines for her face but uses cocoa butter for moisturizer.  In addition patient reports multiple pruritic  rash on her fingers.  She stated the rash are dry and scaly.  PERTINENT  PMH / PSH: Reviewed  OBJECTIVE:   BP 106/85   Pulse 87   Wt 233 lb (105.7 kg)   SpO2 100%   BMI 38.77 kg/m   Physical Exam General: Alert, well appearing, NAD Cardiovascular: RRR, No Murmurs, Normal S2/S2 Respiratory: CTAB, No wheezing or Rales Skin: diffused erythematous  and skin colored papular rash on the face. Skin appears dry Extremities- dry scaly lesion on multiple dorsal surface of the fingers bilaterally  ASSESSMENT/PLAN:   Diabetes insipidus (HCC) Recently seen for polyuria and polydipsia suspected to be due to her diabetes insipidus.  Today patient clarified she ran out of her desmopressin  for weeks and has not been on her medication.  She recently spoke to her endocrinologist who refilled her medication.  We discussed her recent results including BMP which showed normal sodium. Her labs were generally unremarkable.  Patient counseled on need for medication adherence.  She verbalized understanding and amendable to plan.   Facial Acne Patient history and exam finding consistent with  acne.  On exam has diffused papular lesion at various healing stages on the face. Will manage with topical antibiotics and routine skin hygiene. -Encourage usual benzoyl peroxide facial  wash 2 times daily -Rx Clindamycin  (Benzaclin)  facial cream to be applied twice daily -Recommend avoiding sun exposure -Advised use of sunscreen SPF 30 -Advise use of skin toner (Witch hazel) -Recommend use of water  based, unscented moisturizer -Avoid use of sponge to scrub face  -Change pillowcase at least twice weekly  Eczema Patient with erythematous scaly rash on multiple fingers suspicious of Atopic dermatitis.  -Apply 0.05% Triamcinolone  ointment to the face and groin area 5 days at a time (Medium strength)  -Recommend adequate moisturizer with Vaseline and at least 3 times a day.   Norleen April, MD [facility]

## 2024-06-08 NOTE — Patient Instructions (Signed)
 Pleasure to see you today.  Lab from your few visits ago was generally normal.  Please make sure you are taking your medications as prescribed.  The rash on your hands are more consistent with eczema.  I have sent in prescription for Kenalog  which you apply over the area at least for the next 5 days.  And make sure you keep your hands moisturized with Vaseline or Aquaphor.  For your acne please see instructions below.  I have also sent in antibiotics ointment which you apply at night before you go to bed.  Acne Plan  Products: Face Wash:  Use a gentle cleanser containing Benzoyl Peroxide, such as Cetaphil (generic version of this is fine) Toner: Pension scheme manager:  Use an "oil-free" moisturizer with SPF (Cetaphil/ Cerave non scented)   Morning and Night Routine: Wash face with gentle cleanser (Containing Benzyl Peroxide), then completely dry Apply toner Apply Moisturizer to entire face (Containing Retinoid acid)  Use the Benzaclin in the morning, apply pea size   Remember: Your acne will probably get worse before it gets better It takes at least 2 months for the treatment to start working Use oil free soaps and lotions; these can be over the counter or store-brand Don't use harsh scrubs or astringents (No sponge), these can make skin irritation and acne worse Moisturize daily with oil free lotion because the acne medicines will dry your skin  Call your doctor if you have: Lots of skin dryness or redness that doesn't get better if you use a moisturizer or if you use the prescription cream or lotion every other day    Stop using the acne medicine immediately and see your doctor if you are or become pregnant or if you think you had an allergic reaction (itchy rash, difficulty breathing, nausea, vomiting) to your acne medication.

## 2024-06-11 ENCOUNTER — Ambulatory Visit

## 2024-06-11 ENCOUNTER — Ambulatory Visit: Admitting: Radiation Oncology

## 2024-06-11 ENCOUNTER — Telehealth: Payer: Self-pay

## 2024-06-11 ENCOUNTER — Other Ambulatory Visit (HOSPITAL_COMMUNITY): Payer: Self-pay

## 2024-06-11 DIAGNOSIS — Z510 Encounter for antineoplastic radiation therapy: Secondary | ICD-10-CM | POA: Diagnosis not present

## 2024-06-11 DIAGNOSIS — D352 Benign neoplasm of pituitary gland: Secondary | ICD-10-CM | POA: Diagnosis not present

## 2024-06-11 NOTE — Telephone Encounter (Signed)
 Rec'd PA request for Clindamycin  Phos-Benzoyl Perox 1-5% gel.    Preferred meds:     THE FOLLOWING is preferred by the insurance.  If suggested medication is appropriate, Please send in a new RX and discontinue this one. If not, please advise as to why it's not appropriate so that we may request a Prior Authorization. Please note, some preferred medications may still require a PA.  If the suggested medications have not been trialed and there are no contraindications to their use, the PA will not be submitted, as it will not be approved.

## 2024-06-12 ENCOUNTER — Other Ambulatory Visit: Payer: Self-pay | Admitting: Student

## 2024-06-12 ENCOUNTER — Ambulatory Visit: Admitting: Radiation Oncology

## 2024-06-12 ENCOUNTER — Ambulatory Visit

## 2024-06-12 DIAGNOSIS — L700 Acne vulgaris: Secondary | ICD-10-CM

## 2024-06-12 MED ORDER — CLINDAMYCIN PHOS (ONCE-DAILY) 1 % EX GEL: 1.0000 | Freq: Every day | CUTANEOUS | 2 refills | Status: AC

## 2024-06-12 NOTE — Progress Notes (Signed)
 Changed prescription to Clindagel for acne per insurance formulary

## 2024-06-13 ENCOUNTER — Ambulatory Visit: Admitting: Radiation Oncology

## 2024-06-13 ENCOUNTER — Ambulatory Visit

## 2024-06-13 ENCOUNTER — Other Ambulatory Visit: Payer: Self-pay

## 2024-06-13 ENCOUNTER — Ambulatory Visit
Admission: RE | Admit: 2024-06-13 | Discharge: 2024-06-13 | Disposition: A | Discharge status: Home / Self Care | Source: Ambulatory Visit | Attending: Radiation Oncology | Admitting: Radiation Oncology

## 2024-06-13 DIAGNOSIS — D352 Benign neoplasm of pituitary gland: Secondary | ICD-10-CM | POA: Diagnosis not present

## 2024-06-13 DIAGNOSIS — Z510 Encounter for antineoplastic radiation therapy: Secondary | ICD-10-CM | POA: Diagnosis not present

## 2024-06-13 LAB — RAD ONC ARIA SESSION SUMMARY
Course Elapsed Days: 0
Plan Fractions Treated to Date: 1
Plan Prescribed Dose Per Fraction: 1.8 Gy
Plan Total Fractions Prescribed: 28
Plan Total Prescribed Dose: 50.4 Gy
Reference Point Dosage Given to Date: 1.8 Gy
Reference Point Session Dosage Given: 1.8 Gy
Session Number: 1

## 2024-06-14 ENCOUNTER — Other Ambulatory Visit: Payer: Self-pay

## 2024-06-14 ENCOUNTER — Ambulatory Visit
Admission: RE | Admit: 2024-06-14 | Discharge: 2024-06-14 | Disposition: A | Discharge status: Home / Self Care | Source: Ambulatory Visit | Attending: Radiation Oncology | Admitting: Radiation Oncology

## 2024-06-14 ENCOUNTER — Ambulatory Visit: Admitting: Radiation Oncology

## 2024-06-14 DIAGNOSIS — Z510 Encounter for antineoplastic radiation therapy: Secondary | ICD-10-CM | POA: Diagnosis not present

## 2024-06-14 DIAGNOSIS — D352 Benign neoplasm of pituitary gland: Secondary | ICD-10-CM | POA: Diagnosis not present

## 2024-06-14 LAB — RAD ONC ARIA SESSION SUMMARY
Course Elapsed Days: 1
Plan Fractions Treated to Date: 2
Plan Prescribed Dose Per Fraction: 1.8 Gy
Plan Total Fractions Prescribed: 28
Plan Total Prescribed Dose: 50.4 Gy
Reference Point Dosage Given to Date: 3.6 Gy
Reference Point Session Dosage Given: 1.8 Gy
Session Number: 2

## 2024-06-15 ENCOUNTER — Ambulatory Visit

## 2024-06-15 ENCOUNTER — Other Ambulatory Visit: Payer: Self-pay

## 2024-06-15 ENCOUNTER — Ambulatory Visit: Admitting: Radiation Oncology

## 2024-06-15 ENCOUNTER — Ambulatory Visit
Admission: RE | Admit: 2024-06-15 | Discharge: 2024-06-15 | Disposition: A | Discharge status: Home / Self Care | Source: Ambulatory Visit | Attending: Radiation Oncology | Admitting: Radiation Oncology

## 2024-06-15 DIAGNOSIS — Z510 Encounter for antineoplastic radiation therapy: Secondary | ICD-10-CM | POA: Diagnosis not present

## 2024-06-15 DIAGNOSIS — D352 Benign neoplasm of pituitary gland: Secondary | ICD-10-CM | POA: Diagnosis not present

## 2024-06-15 LAB — RAD ONC ARIA SESSION SUMMARY
Course Elapsed Days: 2
Plan Fractions Treated to Date: 3
Plan Prescribed Dose Per Fraction: 1.8 Gy
Plan Total Fractions Prescribed: 28
Plan Total Prescribed Dose: 50.4 Gy
Reference Point Dosage Given to Date: 5.4 Gy
Reference Point Session Dosage Given: 1.8 Gy
Session Number: 3

## 2024-06-19 ENCOUNTER — Ambulatory Visit
Admission: RE | Admit: 2024-06-19 | Discharge: 2024-06-19 | Disposition: A | Discharge status: Home / Self Care | Source: Ambulatory Visit | Attending: Radiation Oncology

## 2024-06-19 ENCOUNTER — Ambulatory Visit: Admitting: Radiation Oncology

## 2024-06-19 ENCOUNTER — Other Ambulatory Visit: Payer: Self-pay

## 2024-06-19 ENCOUNTER — Ambulatory Visit
Admission: RE | Admit: 2024-06-19 | Discharge: 2024-06-19 | Disposition: A | Discharge status: Home / Self Care | Source: Ambulatory Visit | Attending: Radiation Oncology | Admitting: Radiation Oncology

## 2024-06-19 DIAGNOSIS — Z510 Encounter for antineoplastic radiation therapy: Secondary | ICD-10-CM | POA: Insufficient documentation

## 2024-06-19 DIAGNOSIS — D352 Benign neoplasm of pituitary gland: Secondary | ICD-10-CM | POA: Diagnosis present

## 2024-06-19 LAB — RAD ONC ARIA SESSION SUMMARY
Course Elapsed Days: 6
Plan Fractions Treated to Date: 4
Plan Prescribed Dose Per Fraction: 1.8 Gy
Plan Total Fractions Prescribed: 28
Plan Total Prescribed Dose: 50.4 Gy
Reference Point Dosage Given to Date: 7.2 Gy
Reference Point Session Dosage Given: 1.8 Gy
Session Number: 4

## 2024-06-20 ENCOUNTER — Other Ambulatory Visit: Payer: Self-pay

## 2024-06-20 ENCOUNTER — Ambulatory Visit
Admission: RE | Admit: 2024-06-20 | Discharge: 2024-06-20 | Disposition: A | Discharge status: Home / Self Care | Source: Ambulatory Visit | Attending: Radiation Oncology

## 2024-06-20 DIAGNOSIS — Z510 Encounter for antineoplastic radiation therapy: Secondary | ICD-10-CM | POA: Diagnosis not present

## 2024-06-20 LAB — RAD ONC ARIA SESSION SUMMARY
Course Elapsed Days: 7
Plan Fractions Treated to Date: 5
Plan Prescribed Dose Per Fraction: 1.8 Gy
Plan Total Fractions Prescribed: 28
Plan Total Prescribed Dose: 50.4 Gy
Reference Point Dosage Given to Date: 9 Gy
Reference Point Session Dosage Given: 1.8 Gy
Session Number: 5

## 2024-06-21 ENCOUNTER — Other Ambulatory Visit: Payer: Self-pay

## 2024-06-21 ENCOUNTER — Ambulatory Visit
Admission: RE | Admit: 2024-06-21 | Discharge: 2024-06-21 | Disposition: A | Discharge status: Home / Self Care | Source: Ambulatory Visit | Attending: Radiation Oncology

## 2024-06-21 DIAGNOSIS — E049 Nontoxic goiter, unspecified: Secondary | ICD-10-CM | POA: Diagnosis not present

## 2024-06-21 DIAGNOSIS — E232 Diabetes insipidus: Secondary | ICD-10-CM | POA: Diagnosis not present

## 2024-06-21 DIAGNOSIS — Z510 Encounter for antineoplastic radiation therapy: Secondary | ICD-10-CM | POA: Diagnosis not present

## 2024-06-21 DIAGNOSIS — E230 Hypopituitarism: Secondary | ICD-10-CM | POA: Diagnosis not present

## 2024-06-21 DIAGNOSIS — E039 Hypothyroidism, unspecified: Secondary | ICD-10-CM | POA: Diagnosis not present

## 2024-06-21 LAB — RAD ONC ARIA SESSION SUMMARY
Course Elapsed Days: 8
Plan Fractions Treated to Date: 6
Plan Prescribed Dose Per Fraction: 1.8 Gy
Plan Total Fractions Prescribed: 28
Plan Total Prescribed Dose: 50.4 Gy
Reference Point Dosage Given to Date: 10.8 Gy
Reference Point Session Dosage Given: 1.8 Gy
Session Number: 6

## 2024-06-22 ENCOUNTER — Other Ambulatory Visit: Payer: Self-pay

## 2024-06-22 ENCOUNTER — Ambulatory Visit
Admission: RE | Admit: 2024-06-22 | Discharge: 2024-06-22 | Disposition: A | Discharge status: Home / Self Care | Source: Ambulatory Visit | Attending: Radiation Oncology | Admitting: Radiation Oncology

## 2024-06-22 DIAGNOSIS — Z510 Encounter for antineoplastic radiation therapy: Secondary | ICD-10-CM | POA: Diagnosis not present

## 2024-06-22 LAB — RAD ONC ARIA SESSION SUMMARY
Course Elapsed Days: 9
Plan Fractions Treated to Date: 7
Plan Prescribed Dose Per Fraction: 1.8 Gy
Plan Total Fractions Prescribed: 28
Plan Total Prescribed Dose: 50.4 Gy
Reference Point Dosage Given to Date: 12.6 Gy
Reference Point Session Dosage Given: 1.8 Gy
Session Number: 7

## 2024-06-25 ENCOUNTER — Ambulatory Visit
Admission: RE | Admit: 2024-06-25 | Discharge: 2024-06-25 | Disposition: A | Discharge status: Home / Self Care | Source: Ambulatory Visit | Attending: Radiation Oncology

## 2024-06-25 ENCOUNTER — Other Ambulatory Visit: Payer: Self-pay

## 2024-06-25 DIAGNOSIS — Z510 Encounter for antineoplastic radiation therapy: Secondary | ICD-10-CM | POA: Diagnosis not present

## 2024-06-25 LAB — RAD ONC ARIA SESSION SUMMARY
Course Elapsed Days: 12
Plan Fractions Treated to Date: 8
Plan Prescribed Dose Per Fraction: 1.8 Gy
Plan Total Fractions Prescribed: 28
Plan Total Prescribed Dose: 50.4 Gy
Reference Point Dosage Given to Date: 14.4 Gy
Reference Point Session Dosage Given: 1.8 Gy
Session Number: 8

## 2024-06-26 ENCOUNTER — Ambulatory Visit
Admission: RE | Admit: 2024-06-26 | Discharge: 2024-06-26 | Disposition: A | Discharge status: Home / Self Care | Source: Ambulatory Visit | Attending: Radiation Oncology

## 2024-06-26 ENCOUNTER — Ambulatory Visit (INDEPENDENT_AMBULATORY_CARE_PROVIDER_SITE_OTHER): Admitting: Student

## 2024-06-26 ENCOUNTER — Other Ambulatory Visit: Payer: Self-pay

## 2024-06-26 VITALS — BP 117/81 | HR 70 | Temp 98.4°F | Ht 65.0 in | Wt 235.8 lb

## 2024-06-26 DIAGNOSIS — Z510 Encounter for antineoplastic radiation therapy: Secondary | ICD-10-CM | POA: Diagnosis not present

## 2024-06-26 DIAGNOSIS — E6609 Other obesity due to excess calories: Secondary | ICD-10-CM | POA: Diagnosis not present

## 2024-06-26 LAB — RAD ONC ARIA SESSION SUMMARY
Course Elapsed Days: 13
Plan Fractions Treated to Date: 9
Plan Prescribed Dose Per Fraction: 1.8 Gy
Plan Total Fractions Prescribed: 28
Plan Total Prescribed Dose: 50.4 Gy
Reference Point Dosage Given to Date: 16.2 Gy
Reference Point Session Dosage Given: 1.8 Gy
Session Number: 9

## 2024-06-26 MED ORDER — WEGOVY 1 MG/0.5ML ~~LOC~~ SOAJ
1.0000 mg | SUBCUTANEOUS | 2 refills | Status: AC
Start: 2024-06-26 — End: ?

## 2024-06-26 NOTE — Progress Notes (Signed)
    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 Patient also expressed desire for referral to weight loss clinic.   PERTINENT  PMH / PSH: Reviewed  OBJECTIVE:   BP 117/81   Pulse 70   Temp 98.4 F (36.9 C)   Ht 5' 5 (1.651 m)   Wt 235 lb 12.8 oz (107 kg)   SpO2 100%   BMI 39.24 kg/m    Physical Exam General: Alert, well appearing, morbidly obese, NAD Cardiovascular: RRR, No Murmurs, Normal S2/S2 Respiratory: CTAB, No wheezing or Rales Abdomen: No distension or tenderness Extremities: No edema on extremities   Psych: Normal affect, good judgment, pleasant  ASSESSMENT/PLAN:   Class 2 severe obesity with serious comorbidity and body mass index (BMI) of 39.0 to 39.9 in adult (HCC) Weight today is 235 pounds and BMI of 39.4 kg/m.  Patient appeared to have tried diet and exercise with no significant weight loss.  Previously on Wegovy  1.7 mg weekly with some weight loss and good tolerance but discontinued due to pending surgery at the time. - Restarted patient on Wegovy  1 mg weekly plans to ramp up if tolerated in 4 weeks - Placed referral to weight management clinic - Encouraged continued dieting and exercise - Recommend at least of moderate intensity exercises weekly     Norleen April, MD [facility]

## 2024-06-26 NOTE — Assessment & Plan Note (Signed)
 Weight today is 235 pounds and BMI of 39.4 kg/m.  Patient appeared to have tried diet and exercise with no significant weight loss.  Previously on Wegovy  1.7 mg weekly with some weight loss and good tolerance but discontinued due to pending surgery at the time. - Restarted patient on Wegovy  1 mg weekly plans to ramp up if tolerated in 4 weeks - Placed referral to weight management clinic - Encouraged continued dieting and exercise - Recommend at least of moderate intensity exercises weekly

## 2024-06-26 NOTE — Patient Instructions (Addendum)
 Pleasure to see you today.  I have restarted your Wegovy  1 mg weekly.  I have also sent in referral to weight loss clinic.

## 2024-06-27 ENCOUNTER — Ambulatory Visit
Admission: RE | Admit: 2024-06-27 | Discharge: 2024-06-27 | Disposition: A | Discharge status: Home / Self Care | Source: Ambulatory Visit | Attending: Radiation Oncology

## 2024-06-27 ENCOUNTER — Other Ambulatory Visit: Payer: Self-pay

## 2024-06-27 DIAGNOSIS — Z510 Encounter for antineoplastic radiation therapy: Secondary | ICD-10-CM | POA: Diagnosis not present

## 2024-06-27 LAB — RAD ONC ARIA SESSION SUMMARY
Course Elapsed Days: 14
Plan Fractions Treated to Date: 10
Plan Prescribed Dose Per Fraction: 1.8 Gy
Plan Total Fractions Prescribed: 28
Plan Total Prescribed Dose: 50.4 Gy
Reference Point Dosage Given to Date: 18 Gy
Reference Point Session Dosage Given: 1.8 Gy
Session Number: 10

## 2024-06-28 ENCOUNTER — Ambulatory Visit: Admission: RE | Admit: 2024-06-28 | Source: Ambulatory Visit

## 2024-06-29 ENCOUNTER — Other Ambulatory Visit: Payer: Self-pay

## 2024-06-29 ENCOUNTER — Ambulatory Visit
Admission: RE | Admit: 2024-06-29 | Discharge: 2024-06-29 | Disposition: A | Discharge status: Home / Self Care | Source: Ambulatory Visit | Attending: Radiation Oncology | Admitting: Radiation Oncology

## 2024-06-29 DIAGNOSIS — Z510 Encounter for antineoplastic radiation therapy: Secondary | ICD-10-CM | POA: Diagnosis not present

## 2024-06-29 LAB — RAD ONC ARIA SESSION SUMMARY
Course Elapsed Days: 16
Plan Fractions Treated to Date: 11
Plan Prescribed Dose Per Fraction: 1.8 Gy
Plan Total Fractions Prescribed: 28
Plan Total Prescribed Dose: 50.4 Gy
Reference Point Dosage Given to Date: 19.8 Gy
Reference Point Session Dosage Given: 1.8 Gy
Session Number: 11

## 2024-07-02 ENCOUNTER — Other Ambulatory Visit: Payer: Self-pay

## 2024-07-02 ENCOUNTER — Ambulatory Visit
Admission: RE | Admit: 2024-07-02 | Discharge: 2024-07-02 | Disposition: A | Discharge status: Home / Self Care | Source: Ambulatory Visit | Attending: Radiation Oncology | Admitting: Radiation Oncology

## 2024-07-02 ENCOUNTER — Ambulatory Visit

## 2024-07-02 DIAGNOSIS — Z510 Encounter for antineoplastic radiation therapy: Secondary | ICD-10-CM | POA: Diagnosis not present

## 2024-07-02 LAB — RAD ONC ARIA SESSION SUMMARY
Course Elapsed Days: 19
Plan Fractions Treated to Date: 12
Plan Prescribed Dose Per Fraction: 1.8 Gy
Plan Total Fractions Prescribed: 28
Plan Total Prescribed Dose: 50.4 Gy
Reference Point Dosage Given to Date: 21.6 Gy
Reference Point Session Dosage Given: 1.8 Gy
Session Number: 12

## 2024-07-03 ENCOUNTER — Other Ambulatory Visit: Payer: Self-pay

## 2024-07-03 ENCOUNTER — Other Ambulatory Visit: Payer: Self-pay | Admitting: Radiation Therapy

## 2024-07-03 ENCOUNTER — Ambulatory Visit
Admission: RE | Admit: 2024-07-03 | Discharge: 2024-07-03 | Disposition: A | Discharge status: Home / Self Care | Source: Ambulatory Visit | Attending: Radiation Oncology | Admitting: Radiation Oncology

## 2024-07-03 ENCOUNTER — Encounter: Payer: Self-pay | Admitting: Radiation Therapy

## 2024-07-03 DIAGNOSIS — H548 Legal blindness, as defined in USA: Secondary | ICD-10-CM

## 2024-07-03 DIAGNOSIS — D352 Benign neoplasm of pituitary gland: Secondary | ICD-10-CM

## 2024-07-03 DIAGNOSIS — Z510 Encounter for antineoplastic radiation therapy: Secondary | ICD-10-CM | POA: Diagnosis not present

## 2024-07-03 LAB — RAD ONC ARIA SESSION SUMMARY
Course Elapsed Days: 20
Plan Fractions Treated to Date: 13
Plan Prescribed Dose Per Fraction: 1.8 Gy
Plan Total Fractions Prescribed: 28
Plan Total Prescribed Dose: 50.4 Gy
Reference Point Dosage Given to Date: 23.4 Gy
Reference Point Session Dosage Given: 1.8 Gy
Session Number: 13

## 2024-07-03 NOTE — Progress Notes (Signed)
 Mrs. Virginia Cardenas is currently receiving radiation treatment for treatment of her pituitary macroadenoma. Due to her history of headache and vision changes presumed from the pituitary lesion, Dr. Izell requested a one month post op visit with ophthalmology for a new base line vision evaluation with basic and visual field exams.   Ms. Virginia Cardenas was seen by Dr. 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    Dr. McCuen is sending Ms. 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   A referral for social work has been placed for an extra layer of support for Ms Virginia Cardenas, due to this new difficult diagnosis.   Devere Perch R.T(R)(T) Radiation Special Procedures Lead

## 2024-07-04 ENCOUNTER — Telehealth: Payer: Self-pay

## 2024-07-04 ENCOUNTER — Inpatient Hospital Stay: Attending: Neurosurgery

## 2024-07-04 ENCOUNTER — Ambulatory Visit
Admission: RE | Admit: 2024-07-04 | Discharge: 2024-07-04 | Disposition: A | Discharge status: Home / Self Care | Source: Ambulatory Visit | Attending: Radiation Oncology

## 2024-07-04 ENCOUNTER — Other Ambulatory Visit: Payer: Self-pay

## 2024-07-04 DIAGNOSIS — Z510 Encounter for antineoplastic radiation therapy: Secondary | ICD-10-CM | POA: Diagnosis not present

## 2024-07-04 LAB — RAD ONC ARIA SESSION SUMMARY
Course Elapsed Days: 21
Plan Fractions Treated to Date: 14
Plan Prescribed Dose Per Fraction: 1.8 Gy
Plan Total Fractions Prescribed: 28
Plan Total Prescribed Dose: 50.4 Gy
Reference Point Dosage Given to Date: 25.2 Gy
Reference Point Session Dosage Given: 1.8 Gy
Session Number: 14

## 2024-07-04 NOTE — Progress Notes (Signed)
 [facility] Clinical Social Work  Clinical Social Work was referred by medical provider for emotional support.  Clinical Social Work Intern contacted patient by phone to offer support and assess for needs.   Pt states she has no material needs at this time.  Interventions: Provided patient with information about [employer] such as counseling and classes, as well as information about [employer]. Pt expressed interest in yoga and tai chi, as well as other exercise classes.       Follow Up Plan:  Patient will contact CSW once she has looked at her schedule and knows which classes she would like to register for.    Thersia KATHEE Daring  Clinical Social Work Intern [employer]

## 2024-07-04 NOTE — Telephone Encounter (Signed)
 [facility] Clinical Social Work  Clinical Social Work was referred by medical provider for emotional support.  Clinical Social Work Intern contacted patient by phone to offer support and assess for needs.   Pt states she has no material needs at this time.  Interventions: Provided patient with information about [employer] such as counseling and classes, as well as information about [employer]. Pt expressed interest in yoga and tai chi, as well as other exercise classes.       Follow Up Plan:  Patient will contact CSW once she has looked at her schedule and knows which classes she would like to register for.    Thersia KATHEE Daring  Clinical Social Work Intern [employer]

## 2024-07-05 ENCOUNTER — Ambulatory Visit
Admission: RE | Admit: 2024-07-05 | Discharge: 2024-07-05 | Disposition: A | Discharge status: Home / Self Care | Source: Ambulatory Visit | Attending: Radiation Oncology | Admitting: Radiation Oncology

## 2024-07-05 ENCOUNTER — Other Ambulatory Visit: Payer: Self-pay

## 2024-07-05 DIAGNOSIS — Z510 Encounter for antineoplastic radiation therapy: Secondary | ICD-10-CM | POA: Diagnosis not present

## 2024-07-05 LAB — RAD ONC ARIA SESSION SUMMARY
Course Elapsed Days: 22
Plan Fractions Treated to Date: 15
Plan Prescribed Dose Per Fraction: 1.8 Gy
Plan Total Fractions Prescribed: 28
Plan Total Prescribed Dose: 50.4 Gy
Reference Point Dosage Given to Date: 27 Gy
Reference Point Session Dosage Given: 1.8 Gy
Session Number: 15

## 2024-07-06 ENCOUNTER — Ambulatory Visit
Admission: RE | Admit: 2024-07-06 | Discharge: 2024-07-06 | Disposition: A | Discharge status: Home / Self Care | Source: Ambulatory Visit | Attending: Radiation Oncology | Admitting: Radiation Oncology

## 2024-07-06 ENCOUNTER — Other Ambulatory Visit: Payer: Self-pay

## 2024-07-06 DIAGNOSIS — Z510 Encounter for antineoplastic radiation therapy: Secondary | ICD-10-CM | POA: Diagnosis not present

## 2024-07-06 LAB — RAD ONC ARIA SESSION SUMMARY
Course Elapsed Days: 23
Plan Fractions Treated to Date: 16
Plan Prescribed Dose Per Fraction: 1.8 Gy
Plan Total Fractions Prescribed: 28
Plan Total Prescribed Dose: 50.4 Gy
Reference Point Dosage Given to Date: 28.8 Gy
Reference Point Session Dosage Given: 1.8 Gy
Session Number: 16

## 2024-07-07 ENCOUNTER — Ambulatory Visit

## 2024-07-09 ENCOUNTER — Ambulatory Visit
Admission: RE | Admit: 2024-07-09 | Discharge: 2024-07-09 | Disposition: A | Discharge status: Home / Self Care | Source: Ambulatory Visit | Attending: Radiation Oncology

## 2024-07-09 ENCOUNTER — Ambulatory Visit
Admission: RE | Admit: 2024-07-09 | Discharge: 2024-07-09 | Disposition: A | Discharge status: Home / Self Care | Source: Ambulatory Visit | Attending: Radiation Oncology | Admitting: Radiation Oncology

## 2024-07-09 ENCOUNTER — Other Ambulatory Visit: Payer: Self-pay

## 2024-07-09 DIAGNOSIS — Z510 Encounter for antineoplastic radiation therapy: Secondary | ICD-10-CM | POA: Diagnosis not present

## 2024-07-09 LAB — RAD ONC ARIA SESSION SUMMARY
Course Elapsed Days: 26
Plan Fractions Treated to Date: 17
Plan Prescribed Dose Per Fraction: 1.8 Gy
Plan Total Fractions Prescribed: 28
Plan Total Prescribed Dose: 50.4 Gy
Reference Point Dosage Given to Date: 30.6 Gy
Reference Point Session Dosage Given: 1.8 Gy
Session Number: 17

## 2024-07-10 ENCOUNTER — Other Ambulatory Visit: Payer: Self-pay

## 2024-07-10 ENCOUNTER — Ambulatory Visit
Admission: RE | Admit: 2024-07-10 | Discharge: 2024-07-10 | Disposition: A | Discharge status: Home / Self Care | Source: Ambulatory Visit | Attending: Radiation Oncology

## 2024-07-10 DIAGNOSIS — Z510 Encounter for antineoplastic radiation therapy: Secondary | ICD-10-CM | POA: Diagnosis not present

## 2024-07-10 LAB — RAD ONC ARIA SESSION SUMMARY
Course Elapsed Days: 27
Plan Fractions Treated to Date: 18
Plan Prescribed Dose Per Fraction: 1.8 Gy
Plan Total Fractions Prescribed: 28
Plan Total Prescribed Dose: 50.4 Gy
Reference Point Dosage Given to Date: 32.4 Gy
Reference Point Session Dosage Given: 1.8 Gy
Session Number: 18

## 2024-07-11 ENCOUNTER — Other Ambulatory Visit: Payer: Self-pay

## 2024-07-11 ENCOUNTER — Ambulatory Visit
Admission: RE | Admit: 2024-07-11 | Discharge: 2024-07-11 | Disposition: A | Discharge status: Home / Self Care | Source: Ambulatory Visit | Attending: Radiation Oncology | Admitting: Radiation Oncology

## 2024-07-11 DIAGNOSIS — Z510 Encounter for antineoplastic radiation therapy: Secondary | ICD-10-CM | POA: Diagnosis not present

## 2024-07-11 LAB — RAD ONC ARIA SESSION SUMMARY
Course Elapsed Days: 28
Plan Fractions Treated to Date: 19
Plan Prescribed Dose Per Fraction: 1.8 Gy
Plan Total Fractions Prescribed: 28
Plan Total Prescribed Dose: 50.4 Gy
Reference Point Dosage Given to Date: 34.2 Gy
Reference Point Session Dosage Given: 1.8 Gy
Session Number: 19

## 2024-07-12 ENCOUNTER — Other Ambulatory Visit: Payer: Self-pay

## 2024-07-12 ENCOUNTER — Ambulatory Visit
Admission: RE | Admit: 2024-07-12 | Discharge: 2024-07-12 | Disposition: A | Discharge status: Home / Self Care | Source: Ambulatory Visit | Attending: Radiation Oncology | Admitting: Radiation Oncology

## 2024-07-12 DIAGNOSIS — Z510 Encounter for antineoplastic radiation therapy: Secondary | ICD-10-CM | POA: Diagnosis not present

## 2024-07-12 LAB — RAD ONC ARIA SESSION SUMMARY
Course Elapsed Days: 29
Plan Fractions Treated to Date: 20
Plan Prescribed Dose Per Fraction: 1.8 Gy
Plan Total Fractions Prescribed: 28
Plan Total Prescribed Dose: 50.4 Gy
Reference Point Dosage Given to Date: 36 Gy
Reference Point Session Dosage Given: 1.8 Gy
Session Number: 20

## 2024-07-13 ENCOUNTER — Ambulatory Visit
Admission: RE | Admit: 2024-07-13 | Discharge: 2024-07-13 | Disposition: A | Discharge status: Home / Self Care | Source: Ambulatory Visit | Attending: Radiation Oncology | Admitting: Radiation Oncology

## 2024-07-13 ENCOUNTER — Other Ambulatory Visit: Payer: Self-pay

## 2024-07-13 DIAGNOSIS — Z510 Encounter for antineoplastic radiation therapy: Secondary | ICD-10-CM | POA: Diagnosis not present

## 2024-07-13 LAB — RAD ONC ARIA SESSION SUMMARY
Course Elapsed Days: 30
Plan Fractions Treated to Date: 21
Plan Prescribed Dose Per Fraction: 1.8 Gy
Plan Total Fractions Prescribed: 28
Plan Total Prescribed Dose: 50.4 Gy
Reference Point Dosage Given to Date: 37.8 Gy
Reference Point Session Dosage Given: 1.8 Gy
Session Number: 21

## 2024-07-14 ENCOUNTER — Ambulatory Visit

## 2024-07-16 ENCOUNTER — Ambulatory Visit
Admission: RE | Admit: 2024-07-16 | Discharge: 2024-07-16 | Disposition: A | Discharge status: Home / Self Care | Source: Ambulatory Visit | Attending: Radiation Oncology | Admitting: Radiation Oncology

## 2024-07-16 ENCOUNTER — Other Ambulatory Visit: Payer: Self-pay

## 2024-07-16 ENCOUNTER — Ambulatory Visit
Admission: RE | Admit: 2024-07-16 | Discharge: 2024-07-16 | Disposition: A | Source: Ambulatory Visit | Attending: Radiation Oncology

## 2024-07-16 DIAGNOSIS — Z510 Encounter for antineoplastic radiation therapy: Secondary | ICD-10-CM | POA: Diagnosis not present

## 2024-07-16 LAB — RAD ONC ARIA SESSION SUMMARY
Course Elapsed Days: 33
Plan Fractions Treated to Date: 22
Plan Prescribed Dose Per Fraction: 1.8 Gy
Plan Total Fractions Prescribed: 28
Plan Total Prescribed Dose: 50.4 Gy
Reference Point Dosage Given to Date: 39.6 Gy
Reference Point Session Dosage Given: 1.8 Gy
Session Number: 22

## 2024-07-17 ENCOUNTER — Ambulatory Visit
Admission: RE | Admit: 2024-07-17 | Discharge: 2024-07-17 | Disposition: A | Discharge status: Home / Self Care | Source: Ambulatory Visit | Attending: Radiation Oncology | Admitting: Radiation Oncology

## 2024-07-17 ENCOUNTER — Other Ambulatory Visit: Payer: Self-pay

## 2024-07-17 DIAGNOSIS — Z510 Encounter for antineoplastic radiation therapy: Secondary | ICD-10-CM | POA: Diagnosis not present

## 2024-07-17 LAB — RAD ONC ARIA SESSION SUMMARY
Course Elapsed Days: 34
Plan Fractions Treated to Date: 23
Plan Prescribed Dose Per Fraction: 1.8 Gy
Plan Total Fractions Prescribed: 28
Plan Total Prescribed Dose: 50.4 Gy
Reference Point Dosage Given to Date: 41.4 Gy
Reference Point Session Dosage Given: 1.8 Gy
Session Number: 23

## 2024-07-18 ENCOUNTER — Ambulatory Visit
Admission: RE | Admit: 2024-07-18 | Discharge: 2024-07-18 | Disposition: A | Discharge status: Home / Self Care | Source: Ambulatory Visit | Attending: Radiation Oncology | Admitting: Radiation Oncology

## 2024-07-18 ENCOUNTER — Other Ambulatory Visit: Payer: Self-pay

## 2024-07-18 DIAGNOSIS — D352 Benign neoplasm of pituitary gland: Secondary | ICD-10-CM | POA: Diagnosis not present

## 2024-07-18 DIAGNOSIS — Z510 Encounter for antineoplastic radiation therapy: Secondary | ICD-10-CM | POA: Diagnosis not present

## 2024-07-18 LAB — RAD ONC ARIA SESSION SUMMARY
Course Elapsed Days: 35
Plan Fractions Treated to Date: 24
Plan Prescribed Dose Per Fraction: 1.8 Gy
Plan Total Fractions Prescribed: 28
Plan Total Prescribed Dose: 50.4 Gy
Reference Point Dosage Given to Date: 43.2 Gy
Reference Point Session Dosage Given: 1.8 Gy
Session Number: 24

## 2024-07-19 ENCOUNTER — Ambulatory Visit
Admission: RE | Admit: 2024-07-19 | Discharge: 2024-07-19 | Disposition: A | Source: Ambulatory Visit | Attending: Radiation Oncology

## 2024-07-19 ENCOUNTER — Other Ambulatory Visit: Payer: Self-pay

## 2024-07-19 DIAGNOSIS — D352 Benign neoplasm of pituitary gland: Secondary | ICD-10-CM | POA: Diagnosis not present

## 2024-07-19 DIAGNOSIS — Z510 Encounter for antineoplastic radiation therapy: Secondary | ICD-10-CM | POA: Diagnosis not present

## 2024-07-19 LAB — RAD ONC ARIA SESSION SUMMARY
Course Elapsed Days: 36
Plan Fractions Treated to Date: 25
Plan Prescribed Dose Per Fraction: 1.8 Gy
Plan Total Fractions Prescribed: 28
Plan Total Prescribed Dose: 50.4 Gy
Reference Point Dosage Given to Date: 45 Gy
Reference Point Session Dosage Given: 1.8 Gy
Session Number: 25

## 2024-07-20 ENCOUNTER — Ambulatory Visit

## 2024-07-20 ENCOUNTER — Ambulatory Visit
Admission: RE | Admit: 2024-07-20 | Discharge: 2024-07-20 | Disposition: A | Discharge status: Home / Self Care | Source: Ambulatory Visit | Attending: Radiation Oncology | Admitting: Radiation Oncology

## 2024-07-20 ENCOUNTER — Other Ambulatory Visit: Payer: Self-pay

## 2024-07-20 ENCOUNTER — Inpatient Hospital Stay: Attending: Neurosurgery

## 2024-07-20 ENCOUNTER — Telehealth: Payer: Self-pay

## 2024-07-20 DIAGNOSIS — D352 Benign neoplasm of pituitary gland: Secondary | ICD-10-CM | POA: Diagnosis not present

## 2024-07-20 DIAGNOSIS — Z510 Encounter for antineoplastic radiation therapy: Secondary | ICD-10-CM | POA: Diagnosis not present

## 2024-07-20 LAB — RAD ONC ARIA SESSION SUMMARY
Course Elapsed Days: 37
Plan Fractions Treated to Date: 26
Plan Prescribed Dose Per Fraction: 1.8 Gy
Plan Total Fractions Prescribed: 28
Plan Total Prescribed Dose: 50.4 Gy
Reference Point Dosage Given to Date: 46.8 Gy
Reference Point Session Dosage Given: 1.8 Gy
Session Number: 26

## 2024-07-20 NOTE — Progress Notes (Signed)
 [facility] CSW Progress Note  Clinical Social Work Intern contacted patient by phone to follow-up on emotional support and pt's previously stated interest in classes offered through [facility] support services and [employer]. Pt states that she is doing well, although balancing daily radiation treatments with work responsibilities has been challenging. Pt is completing treatment on 07/24/24 and would like to sign up for classes after that, when she has more time. CSW Intern offered validation for all pt is balancing and encouragement that she is almost finished with treatment.        Follow Up Plan:  CSW will follow-up with patient by phone  on 08/01/24, as requested by pt to sign up for classes.    Thersia KATHEE Daring Clinical Social Work Intern [employer]    Patient is participating in a Managed Medicaid Plan:  Yes

## 2024-07-20 NOTE — Telephone Encounter (Signed)
 Clinical Social Work Intern called to follow up with pt re signing up for support services classes. Pt was at work and asked for a call back later in the day. Intern will follow up.  Virginia Cardenas B. Delores CSW Intern [facility]

## 2024-07-23 ENCOUNTER — Other Ambulatory Visit: Payer: Self-pay

## 2024-07-23 ENCOUNTER — Ambulatory Visit

## 2024-07-23 ENCOUNTER — Ambulatory Visit
Admission: RE | Admit: 2024-07-23 | Discharge: 2024-07-23 | Disposition: A | Source: Ambulatory Visit | Attending: Radiation Oncology

## 2024-07-23 DIAGNOSIS — Z510 Encounter for antineoplastic radiation therapy: Secondary | ICD-10-CM | POA: Diagnosis not present

## 2024-07-23 DIAGNOSIS — D352 Benign neoplasm of pituitary gland: Secondary | ICD-10-CM | POA: Diagnosis not present

## 2024-07-23 LAB — RAD ONC ARIA SESSION SUMMARY
Course Elapsed Days: 40
Plan Fractions Treated to Date: 27
Plan Prescribed Dose Per Fraction: 1.8 Gy
Plan Total Fractions Prescribed: 28
Plan Total Prescribed Dose: 50.4 Gy
Reference Point Dosage Given to Date: 48.6 Gy
Reference Point Session Dosage Given: 1.8 Gy
Session Number: 27

## 2024-07-24 ENCOUNTER — Ambulatory Visit

## 2024-07-24 ENCOUNTER — Other Ambulatory Visit: Payer: Self-pay

## 2024-07-24 ENCOUNTER — Other Ambulatory Visit: Payer: Self-pay | Admitting: Radiation Therapy

## 2024-07-24 ENCOUNTER — Ambulatory Visit
Admission: RE | Admit: 2024-07-24 | Discharge: 2024-07-24 | Disposition: A | Source: Ambulatory Visit | Attending: Radiation Oncology

## 2024-07-24 ENCOUNTER — Ambulatory Visit
Admission: RE | Admit: 2024-07-24 | Discharge: 2024-07-24 | Disposition: A | Discharge status: Home / Self Care | Source: Ambulatory Visit | Attending: Radiation Oncology | Admitting: Radiation Oncology

## 2024-07-24 DIAGNOSIS — D352 Benign neoplasm of pituitary gland: Secondary | ICD-10-CM

## 2024-07-24 DIAGNOSIS — Z510 Encounter for antineoplastic radiation therapy: Secondary | ICD-10-CM | POA: Diagnosis not present

## 2024-07-24 LAB — RAD ONC ARIA SESSION SUMMARY
Course Elapsed Days: 41
Plan Fractions Treated to Date: 28
Plan Prescribed Dose Per Fraction: 1.8 Gy
Plan Total Fractions Prescribed: 28
Plan Total Prescribed Dose: 50.4 Gy
Reference Point Dosage Given to Date: 50.4 Gy
Reference Point Session Dosage Given: 1.8 Gy
Session Number: 28

## 2024-07-25 ENCOUNTER — Other Ambulatory Visit: Payer: Self-pay | Admitting: Radiology

## 2024-07-25 DIAGNOSIS — F419 Anxiety disorder, unspecified: Secondary | ICD-10-CM

## 2024-07-25 MED ORDER — LORAZEPAM 0.5 MG PO TABS: 0.5000 mg | ORAL_TABLET | ORAL | 0 refills | Status: AC | PRN

## 2024-07-27 DIAGNOSIS — E049 Nontoxic goiter, unspecified: Secondary | ICD-10-CM | POA: Diagnosis not present

## 2024-07-27 DIAGNOSIS — E230 Hypopituitarism: Secondary | ICD-10-CM | POA: Diagnosis not present

## 2024-07-27 DIAGNOSIS — E232 Diabetes insipidus: Secondary | ICD-10-CM | POA: Diagnosis not present

## 2024-07-27 DIAGNOSIS — E039 Hypothyroidism, unspecified: Secondary | ICD-10-CM | POA: Diagnosis not present

## 2024-07-27 NOTE — Radiation Completion Notes (Signed)
 Patient Name: BRISA, AUTH MRN: [id number] Date of Birth: 1985-12-14 Referring Physician: GERLDINE MAIZES, M.D. Date of Service: 2024-07-27 Radiation Oncologist: Lauraine Golden, M.D. [facility]                             RADIATION ONCOLOGY END OF TREATMENT NOTE     Diagnosis: D35.2 Benign neoplasm of pituitary gland Intent: Curative     ==========DELIVERED PLANS==========  First Treatment Date: 2024-06-13 Last Treatment Date: 2024-07-24   Plan Name: Brain_Pituit Site: Brain Technique: IMRT Mode: Photon Dose Per Fraction: 1.8 Gy Prescribed Dose (Delivered / Prescribed): 50.4 Gy / 50.4 Gy Prescribed Fxs (Delivered / Prescribed): 28 / 28     ==========ON TREATMENT VISIT DATES========== 2024-06-19, 2024-06-25, 2024-07-03, 2024-07-09, 2024-07-16, 2024-07-24     ==========UPCOMING VISITS========== 08/21/2024 [facility]-RADIATION ONC FOLLOW UP 30 Wy[address] Leeroy [place], [place]  08/13/2024 [facility]-NEURO REHAB LOW VISION EVAL Manny Jocelyn HER[place], [place]        ==========APPENDIX - ON TREATMENT VISIT NOTES==========   See weekly On Treatment Notes in Epic for details in the Media tab (listed as Progress notes on the On Treatment Visit Dates listed above).

## 2024-07-30 ENCOUNTER — Encounter (INDEPENDENT_AMBULATORY_CARE_PROVIDER_SITE_OTHER): Payer: Self-pay

## 2024-08-01 ENCOUNTER — Telehealth: Payer: Self-pay

## 2024-08-01 NOTE — Telephone Encounter (Signed)
 CSW Intern called today per pt request to follow up on support services now that pt has completed treatment. Intern left direct contact info and encouraged pt to call back any time.  Virginia Cardenas Clinical Social Work Intern [employer]

## 2024-08-13 ENCOUNTER — Ambulatory Visit: Attending: Optometry | Admitting: Occupational Therapy

## 2024-08-20 ENCOUNTER — Encounter: Payer: Self-pay | Admitting: Radiology

## 2024-08-20 NOTE — Progress Notes (Incomplete)
 Virginia Cardenas presents today via telephone for a routine follow up with Leeroy Due PA-C post radiation to the brain. She completed treatment on 07/24/24.       Does the patient complain of any of the following: Headache:  Really bad HA this weekend, improving since completing the radiation. Took hydrocone which helped.  Visual Changes: She is legally blind.  Hearing Changes: Denies Nausea: Denies Vomiting: Denies Seizures: Denies Balance or coordination issues: Denies Memory issues: Denies Wt Readings from Last 3 Encounters:  03/27/24 237 lb 12.8 oz (107.9 kg)  02/13/24 245 lb 3.2 oz (111.2 kg)  01/18/24 252 lb 6.4 oz (114.5 kg)   Is the patient currently on a Decadron  regimen? : No       Vitals were taken for this visit.

## 2024-08-21 ENCOUNTER — Ambulatory Visit
Admission: RE | Admit: 2024-08-21 | Discharge: 2024-08-21 | Disposition: A | Discharge status: Home / Self Care | Source: Ambulatory Visit | Attending: Radiology | Admitting: Radiology

## 2024-08-21 DIAGNOSIS — D352 Benign neoplasm of pituitary gland: Secondary | ICD-10-CM

## 2024-08-21 HISTORY — DX: Personal history of irradiation: Z92.3

## 2024-08-21 NOTE — Progress Notes (Signed)
 Radiation Oncology         [phone] ________________________________  Name: Virginia Cardenas MRN: [id number]  Date: 08/21/2024  DOB: 04/15/1986  Follow-Up Visit Note Outpatient - Conducted via telephone at patient request.   I spoke with the patient to conduct this consult visit via telephone. The patient was notified in advance and was offered an in person or telemedicine meeting to allow for face to face communication but instead preferred to proceed with a telephone     CC: Lonnie, Mahnoor, MD  Lonnie, Mahnoor, MD  Diagnosis:  Pituitary macroadenoma [facility])  Pituitary tumor; s/p left craniotomy for debulking and adjuvant radiation completed on 07/24/2024   CHIEF COMPLAINT: Here for follow-up and surveillance of pituitary adenoma  Interval Since Last Radiation:  1 month  ==========DELIVERED PLANS==========  First Treatment Date: 2024-06-13 Last Treatment Date: 2024-07-24   Plan Name: Brain_Pituit Site: Brain Technique: IMRT Mode: Photon Dose Per Fraction: 1.8 Gy Prescribed Dose (Delivered / Prescribed): 50.4 Gy / 50.4 Gy Prescribed Fxs (Delivered / Prescribed): 28 / 28  Narrative:  The patient returns today for routine follow-up. She completed her treatment on 07/24/2024.         Patient reports to be feeling a lot better since completing her treatment. She notes a bad headache this weekend at her surgical site. She took a hydrocodone -acetaminophen  which made the headache go away. She has not had a headache since. She denies any nausea or vision disturbances since completing the radiation treatment. She is pleased with how her surgical incision has been healing. She is no longer taking any steroids.    ALLERGIES:  is allergic to latex and metronidazole .  Meds: Current Outpatient Medications  Medication Sig Dispense Refill   acetaminophen  (TYLENOL ) 500 MG tablet Take 500-1,000 mg by mouth every 6 (six) hours as needed (headaches.).     Clindamycin  Phos, Once-Daily,  (CLINDAGEL) 1 % GEL Apply 1 each topically daily. 75 mL 2   desmopressin  (DDAVP ) 0.1 MG tablet Take 0.5 tablets (0.05 mg total) by mouth 2 (two) times daily. 60 tablet 0   HYDROcodone -acetaminophen  (NORCO/VICODIN) 5-325 MG tablet Take 1 tablet by mouth every 4 (four) hours as needed for moderate pain (pain score 4-6). 30 tablet 0   hydrocortisone  (CORTEF ) 10 MG tablet Take 1 tablet (10 mg total) by mouth daily after lunch. 30 tablet 0   hydrocortisone  (CORTEF ) 20 MG tablet Take 1 tablet (20 mg total) by mouth daily with breakfast. 30 tablet 0   levETIRAcetam  (KEPPRA ) 500 MG tablet Take 1 tablet (500 mg total) by mouth 2 (two) times daily. 14 tablet 0   levothyroxine  (SYNTHROID ) 150 MCG tablet Take 1 tablet (150 mcg total) by mouth daily at 6 (six) AM. 30 tablet 0   LORazepam  (ATIVAN ) 0.5 MG tablet Take 1 tablet (0.5 mg total) by mouth as needed for anxiety. 1 tablet 0   LORazepam  (ATIVAN ) 0.5 MG tablet Take 1 tablet (0.5 mg total) by mouth as needed for anxiety. TakeTake 1 tablet (0.5 mg total) by mouth 20 minutes prior to MRI. Do not drive after taking medication. 1 tablet 0   semaglutide -weight management (WEGOVY ) 1 MG/0.5ML SOAJ SQ injection Inject 1 mg into the skin once a week. 2 mL 2   triamcinolone  ointment (KENALOG ) 0.1 % Apply 1 Application topically 2 (two) times daily. 30 g 0   No current facility-administered medications for this encounter.    Physical Findings: The patient is in no acute distress. Patient is alert and oriented.  vitals were not taken for this visit. SABRA   Deferred, due to nature of telephone visit.   Lab Findings: Lab Results  Component Value Date   WBC 12.4 (H) 05/08/2024   HGB 11.0 (L) 05/08/2024   HCT 33.5 (L) 05/08/2024   MCV 88.9 05/08/2024   PLT 383 05/08/2024    @LASTCHEMISTRY @  Radiographic Findings: No results found.  Impression/Plan:  Pituitary tumor; s/p left craniotomy for debulking and adjuvant radiation completed on 07/24/2024   Patient  has healed well from the effects of her radiotherapy. Her headaches have improved since completing treatment. She understands to call if she starts to experience worsening headaches or other neurologic symptoms until we see her next.   MRI of the brain in 5 months. Patient will follow-up with our neuro oncologist, Dr. Buckley, to review the results. Radiation follow-up PRN. We appreciate the opportunity to take part in this patient's care.    This encounter was conducted via telephone.  The patient has provided two factor identification and has given verbal consent for this type of encounter and has been advised to only accept a meeting of this type in a secure network environment.  The time spent during this encounter was 20 minutes including preparation, discussion, and coordination of the patient's care  The attendants for this meeting include Leeroy Due PA-C and patient. During the encounter Leeroy Due PA-C was located at [facility] Radiation Oncology Department.  Patient was located at home.  _____________________________________    Leeroy Due, PA-C

## 2024-09-06 ENCOUNTER — Encounter: Payer: Self-pay | Admitting: Family Medicine

## 2024-09-06 ENCOUNTER — Ambulatory Visit (INDEPENDENT_AMBULATORY_CARE_PROVIDER_SITE_OTHER): Admitting: Family Medicine

## 2024-09-06 VITALS — BP 118/86 | HR 71 | Ht 65.0 in | Wt 243.0 lb

## 2024-09-06 DIAGNOSIS — E230 Hypopituitarism: Secondary | ICD-10-CM | POA: Diagnosis not present

## 2024-09-06 DIAGNOSIS — B86 Scabies: Secondary | ICD-10-CM | POA: Diagnosis not present

## 2024-09-06 DIAGNOSIS — E039 Hypothyroidism, unspecified: Secondary | ICD-10-CM | POA: Diagnosis not present

## 2024-09-06 DIAGNOSIS — E232 Diabetes insipidus: Secondary | ICD-10-CM | POA: Diagnosis not present

## 2024-09-06 DIAGNOSIS — E049 Nontoxic goiter, unspecified: Secondary | ICD-10-CM | POA: Diagnosis not present

## 2024-09-06 DIAGNOSIS — E669 Obesity, unspecified: Secondary | ICD-10-CM | POA: Diagnosis not present

## 2024-09-06 MED ORDER — PERMETHRIN 5 % EX CREA: 1.0000 | TOPICAL_CREAM | Freq: Once | CUTANEOUS | 0 refills | Status: DC

## 2024-09-06 MED ORDER — IVERMECTIN 3 MG PO TABS: 200.0000 ug/kg | ORAL_TABLET | Freq: Once | ORAL | 1 refills | Status: AC

## 2024-09-06 NOTE — Progress Notes (Signed)
   SUBJECTIVE:   CHIEF COMPLAINT / HPI:  Discussed the use of AI scribe software for clinical note transcription with the patient, who gave verbal consent to proceed.  History of Present Illness Virginia Cardenas is a 38 year old female who presents with a worsening rash on her hands and forearms.  Pruritic rash - Worsening rash involving both hands and forearms - Initial onset on two fingers, with subsequent spread to both hands and forearms - Significant nocturnal pruritus - Recent treatment with topical steroid cream for presumed eczema was ineffective - Has had history of similar rash with multiple episodes of scabies  - Previous treatment with permethrin  was effective - History of taking an oral medication, for scabies which was helpful  - No similar rash in family members - No improvement despite maintaining a clean home environment and washing bedding in hot water  biweekly  Environmental and infectious exposure concerns - Suspects environmental etiology, possibly related to her school - Recalls a similar rash occurrence in a nursing home setting     PERTINENT  PMH / PSH: H/o scabies  OBJECTIVE:  BP 118/86   Pulse 71   Ht 5' 5 (1.651 m)   Wt 243 lb (110.2 kg)   LMP 04/27/2024   SpO2 99%   BMI 40.44 kg/m   General: well appearing, in no acute distress Resp: Normal work of breathing on room air Neuro: Alert & Oriented Derm: excoriations of dorsal surfaces of bilateral arms with visible tracks and burrows.   ASSESSMENT/PLAN:   Assessment & Plan Scabies Presentation consistent with scabies, possible school exposure. - Prescribed oral ivermectin  for scabies and 5% permethrin   - Advised patient to clean environment first then take ivermectin , take another dose a week later if still having symptoms  - Advised cleaning home environment, including bedding, to prevent spread. - Consider HIV testing in future patient continues to have recurrent infections.     Areta Saliva, MD [facility]

## 2024-09-06 NOTE — Patient Instructions (Addendum)
 It was wonderful to see you today.  Please bring ALL of your medications with you to every visit.   Today we talked about:  Scabies - I have prescribed the cream permethrin . Apply this to your whole body neck down. Wash all of the bedding, clothing and sanitize. Then take the tablet ivermectin . If you are still having symptoms after a week or so I have given you a refill to take the second dose.   Please follow up if this does not resolve after two weeks.   Thank you for choosing [facility].   Please call [phone] with any questions about today's appointment.  Areta Saliva, MD  Family Medicine

## 2024-09-07 ENCOUNTER — Telehealth: Payer: Self-pay

## 2024-09-07 NOTE — Assessment & Plan Note (Signed)
 Presentation consistent with scabies, possible school exposure. - Prescribed oral ivermectin  for scabies and 5% permethrin   - Advised patient to clean environment first then take ivermectin , take another dose a week later if still having symptoms  - Advised cleaning home environment, including bedding, to prevent spread. - Consider HIV testing in future patient continues to have recurrent infections.

## 2024-09-07 NOTE — Telephone Encounter (Signed)
 Rec'd PA for Ivermectin .   Awaiting for chart notes to close.

## 2024-09-10 NOTE — Telephone Encounter (Signed)
 Prior authorization submitted for IVERMECTIN  to [employer] via Latent.   Key: [id number]

## 2024-09-11 NOTE — Telephone Encounter (Signed)
 Pharmacy Patient Advocate Encounter  Received notification from [employer] that Prior Authorization for IVERMECTIN  has been APPROVED from 09/10/24 to 10/10/24   PA #/Case ID/Reference #: [id number]

## 2024-09-20 ENCOUNTER — Encounter (INDEPENDENT_AMBULATORY_CARE_PROVIDER_SITE_OTHER): Payer: Self-pay

## 2024-10-25 ENCOUNTER — Telehealth: Payer: Self-pay | Admitting: *Deleted

## 2024-10-25 DIAGNOSIS — E232 Diabetes insipidus: Secondary | ICD-10-CM

## 2024-10-25 NOTE — Progress Notes (Signed)
 Complex Care Management Note  Care Guide Note 10/25/2024 Name: JOLYNN BAJOREK MRN: [id number] DOB: May 08, 1986  Gwenette LITTIE Ege is a 39 y.o. year old female who sees Baloch, Mahnoor, MD for primary care. I reached out to [employer] by phone today to offer complex care management services.  Ms. Temples was given information about Complex Care Management services today including:   The Complex Care Management services include support from the care team which includes your Nurse Care Manager, Clinical Social Worker, or Pharmacist.  The Complex Care Management team is here to help remove barriers to the health concerns and goals most important to you. Complex Care Management services are voluntary, and the patient may decline or stop services at any time by request to their care team member.   Complex Care Management Consent Status: Patient agreed to services and verbal consent obtained.   Follow up plan:  Telephone appointment with complex care management team member scheduled for:  11/09/24  Encounter Outcome:  Patient Scheduled  [name]  [facility]  [facility], [facility] Guide  Direct Dial: [phone]  Fax [phone]

## 2024-10-26 ENCOUNTER — Telehealth: Payer: Self-pay

## 2024-10-26 DIAGNOSIS — B86 Scabies: Secondary | ICD-10-CM

## 2024-10-26 DIAGNOSIS — L309 Dermatitis, unspecified: Secondary | ICD-10-CM

## 2024-10-26 MED ORDER — PERMETHRIN 5 % EX CREA: 1.0000 | TOPICAL_CREAM | Freq: Once | CUTANEOUS | 0 refills | Status: AC

## 2024-10-26 MED ORDER — TRIAMCINOLONE ACETONIDE 0.1 % EX OINT: 1.0000 | TOPICAL_OINTMENT | Freq: Two times a day (BID) | CUTANEOUS | 0 refills | Status: AC

## 2024-10-26 NOTE — Telephone Encounter (Signed)
 Patient calls nurse line regarding treatment for scabies.   She was seen on 11/20 and was prescribed Ivermectin  and permethrin .   She was prescribed refill on Ivermectin ,however, does not have refill on permethrin .   Requesting refill on both permethrin  cream and triamcinolone  cream.   Reports that she started noticing rash coming back on Tuesday. Itchy rash noticed between fingers.   Forwarding to PCP.   Chiquita JAYSON English, RN

## 2024-10-29 ENCOUNTER — Encounter (HOSPITAL_COMMUNITY): Payer: Self-pay

## 2024-10-29 ENCOUNTER — Emergency Department (HOSPITAL_COMMUNITY)
Admission: EM | Admit: 2024-10-29 | Discharge: 2024-10-29 | Disposition: A | Discharge status: Home / Self Care | Attending: Emergency Medicine | Admitting: Emergency Medicine

## 2024-10-29 ENCOUNTER — Other Ambulatory Visit: Payer: Self-pay

## 2024-10-29 DIAGNOSIS — Z91040 Latex allergy status: Secondary | ICD-10-CM | POA: Insufficient documentation

## 2024-10-29 DIAGNOSIS — M545 Low back pain, unspecified: Secondary | ICD-10-CM | POA: Diagnosis present

## 2024-10-29 DIAGNOSIS — Z86018 Personal history of other benign neoplasm: Secondary | ICD-10-CM | POA: Diagnosis not present

## 2024-10-29 LAB — CBC WITH DIFFERENTIAL/PLATELET
Abs Immature Granulocytes: 0.01 K/uL (ref 0.00–0.07)
Basophils Absolute: 0 K/uL (ref 0.0–0.1)
Basophils Relative: 1 %
Eosinophils Absolute: 0.1 K/uL (ref 0.0–0.5)
Eosinophils Relative: 2 %
HCT: 38.9 % (ref 36.0–46.0)
Hemoglobin: 12.9 g/dL (ref 12.0–15.0)
Immature Granulocytes: 0 %
Lymphocytes Relative: 53 %
Lymphs Abs: 2.9 K/uL (ref 0.7–4.0)
MCH: 29.2 pg (ref 26.0–34.0)
MCHC: 33.2 g/dL (ref 30.0–36.0)
MCV: 88 fL (ref 80.0–100.0)
Monocytes Absolute: 0.4 K/uL (ref 0.1–1.0)
Monocytes Relative: 8 %
Neutro Abs: 2 K/uL (ref 1.7–7.7)
Neutrophils Relative %: 36 %
Platelets: 345 K/uL (ref 150–400)
RBC: 4.42 MIL/uL (ref 3.87–5.11)
RDW: 14.6 % (ref 11.5–15.5)
WBC: 5.5 K/uL (ref 4.0–10.5)
nRBC: 0 % (ref 0.0–0.2)

## 2024-10-29 LAB — MAGNESIUM: Magnesium: 2.1 mg/dL (ref 1.7–2.4)

## 2024-10-29 LAB — URINALYSIS, ROUTINE W REFLEX MICROSCOPIC
Bilirubin Urine: NEGATIVE
Glucose, UA: NEGATIVE mg/dL
Hgb urine dipstick: NEGATIVE
Ketones, ur: NEGATIVE mg/dL
Leukocytes,Ua: NEGATIVE
Nitrite: NEGATIVE
Protein, ur: NEGATIVE mg/dL
Specific Gravity, Urine: 1.017 (ref 1.005–1.030)
pH: 5 (ref 5.0–8.0)

## 2024-10-29 LAB — BASIC METABOLIC PANEL WITH GFR
Anion gap: 9 (ref 5–15)
BUN: 8 mg/dL (ref 6–20)
CO2: 26 mmol/L (ref 22–32)
Calcium: 9.1 mg/dL (ref 8.9–10.3)
Chloride: 104 mmol/L (ref 98–111)
Creatinine, Ser: 0.81 mg/dL (ref 0.44–1.00)
GFR, Estimated: >60 mL/min
Glucose, Bld: 89 mg/dL (ref 70–99)
Potassium: 3.9 mmol/L (ref 3.5–5.1)
Sodium: 139 mmol/L (ref 135–145)

## 2024-10-29 MED ORDER — MELOXICAM 15 MG PO TABS: 15.0000 mg | ORAL_TABLET | Freq: Every day | ORAL | 0 refills | Status: AC

## 2024-10-29 MED ORDER — METHOCARBAMOL 500 MG PO TABS
1000.0000 mg | ORAL_TABLET | Freq: Once | ORAL | Status: AC
  Administered 2024-10-29: 1000 mg via ORAL
  Filled 2024-10-29: qty 2

## 2024-10-29 MED ORDER — ACETAMINOPHEN 325 MG PO TABS
650.0000 mg | ORAL_TABLET | Freq: Once | ORAL | Status: AC
  Administered 2024-10-29: 650 mg via ORAL
  Filled 2024-10-29: qty 2

## 2024-10-29 MED ORDER — LIDOCAINE 5 % EX PTCH: 1.0000 | MEDICATED_PATCH | CUTANEOUS | 0 refills | Status: DC

## 2024-10-29 MED ORDER — KETOROLAC TROMETHAMINE 60 MG/2ML IM SOLN
30.0000 mg | Freq: Once | INTRAMUSCULAR | Status: AC
  Administered 2024-10-29: 30 mg via INTRAMUSCULAR
  Filled 2024-10-29: qty 2

## 2024-10-29 MED ORDER — METHOCARBAMOL 500 MG PO TABS: 500.0000 mg | ORAL_TABLET | Freq: Three times a day (TID) | ORAL | 0 refills | Status: AC | PRN

## 2024-10-29 NOTE — ED Provider Triage Note (Signed)
 Emergency Medicine Provider Triage Evaluation Note  Virginia Cardenas , a 39 y.o. female  was evaluated in triage.  Pt complains of low back pain.  Review of Systems  Positive: Low back pain Negative: Numbness, weakness, difficulty urinating  Physical Exam  BP 127/88   Pulse 74   Temp (!) 97.2 F (36.2 C)   Resp 16   SpO2 100%  Gen:   Awake, no distress   Resp:  Normal effort  MSK:   Moves extremities without difficulty   Medical Decision Making  Medically screening exam initiated at 10:27 AM.  Appropriate orders placed.  Virginia Cardenas was informed that the remainder of the evaluation will be completed by another provider, this initial triage assessment does not replace that evaluation, and the importance of remaining in the ED until their evaluation is complete.  Patient presents for 2 weeks of bilateral low back pain.  Pain is worsened with certain positions and movements.  She has not taken anything at home for the pain.  She denies any red flag symptoms.  Per chart review, she has a history of a pituitary macroadenoma.  She has undergone craniotomy and is on Solu-Cortef .  She has been told that this is a medication for her kidneys and thus thinks that her pain is related to a kidney problem.  History and exam is more consistent with musculoskeletal etiology.   Virginia Motto, MD 10/29/24 1029

## 2024-10-29 NOTE — ED Triage Notes (Signed)
 PT arrives POV, CC of back pain lasting x2 week. PT endorses bilateral radiating flank pain, 9/10. Denies change in urination frequency or color, or abd pain. Denies N/v/d. Aox4.

## 2024-10-29 NOTE — ED Provider Notes (Signed)
 " [facility] Provider Note   CSN: [id number] Arrival date & time: 10/29/24  9077     Patient presents with: Back Pain   [name] is a 39 y.o. female.    Back Pain Patient presenting for low back pain.  Medical history includes pituitary macroadenoma, prediabetes, obesity.  She denies any preceding trauma.  Patient has had ongoing bilateral low back pain for the past 2 weeks.  Pain is worsened with certain positions and movements.  She has not been taking anything at home for the pain.     Prior to Admission medications  Medication Sig Start Date End Date Taking? Authorizing Provider  lidocaine  (LIDODERM ) 5 % Place 1 patch onto the skin daily for 5 days. Remove & Discard patch within 12 hours or as directed by MD 10/29/24 11/03/24 Yes Melvenia Motto, MD  meloxicam  (MOBIC ) 15 MG tablet Take 1 tablet (15 mg total) by mouth daily for 5 days. 10/29/24 11/03/24 Yes Melvenia Motto, MD  methocarbamol  (ROBAXIN ) 500 MG tablet Take 1 tablet (500 mg total) by mouth every 8 (eight) hours as needed for muscle spasms. 10/29/24  Yes Melvenia Motto, MD  acetaminophen  (TYLENOL ) 500 MG tablet Take 500-1,000 mg by mouth every 6 (six) hours as needed (headaches.).    [provider]  Clindamycin  Phos, Once-Daily, (CLINDAGEL) 1 % GEL Apply 1 each topically daily. 06/12/24   Rosendo Norleen BROCKS, MD  desmopressin  (DDAVP ) 0.1 MG tablet Take 0.5 tablets (0.05 mg total) by mouth 2 (two) times daily. 05/08/24   Lanis Pupa, MD  HYDROcodone -acetaminophen  (NORCO/VICODIN) 5-325 MG tablet Take 1 tablet by mouth every 4 (four) hours as needed for moderate pain (pain score 4-6). 05/08/24   Lanis Pupa, MD  hydrocortisone  (CORTEF ) 10 MG tablet Take 1 tablet (10 mg total) by mouth daily after lunch. 05/08/24   Lanis Pupa, MD  hydrocortisone  (CORTEF ) 20 MG tablet Take 1 tablet (20 mg total) by mouth daily with breakfast. 05/09/24   Lanis Pupa, MD   levETIRAcetam  (KEPPRA ) 500 MG tablet Take 1 tablet (500 mg total) by mouth 2 (two) times daily. 05/08/24   Lanis Pupa, MD  levothyroxine  (SYNTHROID ) 150 MCG tablet Take 1 tablet (150 mcg total) by mouth daily at 6 (six) AM. 05/09/24   Lanis Pupa, MD  LORazepam  (ATIVAN ) 0.5 MG tablet Take 1 tablet (0.5 mg total) by mouth as needed for anxiety. 05/23/24   Wyatt Leeroy HERO, PA-C  LORazepam  (ATIVAN ) 0.5 MG tablet Take 1 tablet (0.5 mg total) by mouth as needed for anxiety. TakeTake 1 tablet (0.5 mg total) by mouth 20 minutes prior to MRI. Do not drive after taking medication. 07/25/24   Wyatt Leeroy HERO, PA-C  semaglutide -weight management (WEGOVY ) 1 MG/0.5ML SOAJ SQ injection Inject 1 mg into the skin once a week. 06/26/24   Rosendo Norleen BROCKS, MD  triamcinolone  ointment (KENALOG ) 0.1 % Apply 1 Application topically 2 (two) times daily. 10/26/24   Baloch, Mahnoor, MD    Allergies: Latex and Metronidazole     Review of Systems  Musculoskeletal:  Positive for back pain.  All other systems reviewed and are negative.   Updated Vital Signs BP 127/88   Pulse 74   Temp (!) 97.2 F (36.2 C)   Resp 16   Ht 5' 5 (1.651 m)   Wt 108.9 kg   SpO2 100%   BMI 39.94 kg/m   Physical Exam Vitals and nursing note reviewed.  Constitutional:  General: She is not in acute distress.    Appearance: Normal appearance. She is well-developed. She is not ill-appearing, toxic-appearing or diaphoretic.  HENT:     Head: Normocephalic and atraumatic.     Right Ear: External ear normal.     Left Ear: External ear normal.     Nose: Nose normal.     Mouth/Throat:     Mouth: Mucous membranes are moist.  Eyes:     Extraocular Movements: Extraocular movements intact.     Conjunctiva/sclera: Conjunctivae normal.  Cardiovascular:     Rate and Rhythm: Normal rate and regular rhythm.  Pulmonary:     Effort: Pulmonary effort is normal. No respiratory distress.  Abdominal:     General: There is no distension.      Palpations: Abdomen is soft.     Tenderness: There is no abdominal tenderness.  Musculoskeletal:        General: No swelling.     Cervical back: Normal range of motion and neck supple.  Skin:    General: Skin is warm and dry.     Coloration: Skin is not jaundiced or pale.  Neurological:     General: No focal deficit present.     Mental Status: She is alert and oriented to person, place, and time.     Sensory: No sensory deficit.     Motor: No weakness.  Psychiatric:        Mood and Affect: Mood normal.        Behavior: Behavior normal.     (all labs ordered are listed, but only abnormal results are displayed) Labs Reviewed  URINALYSIS, ROUTINE W REFLEX MICROSCOPIC - Abnormal; Notable for the following components:      Result Value   APPearance HAZY (*)    All other components within normal limits  BASIC METABOLIC PANEL WITH GFR  CBC WITH DIFFERENTIAL/PLATELET  MAGNESIUM     EKG: None  Radiology: No results found.   Procedures   Medications Ordered in the ED  methocarbamol  (ROBAXIN ) tablet 1,000 mg (1,000 mg Oral Given 10/29/24 1116)  acetaminophen  (TYLENOL ) tablet 650 mg (650 mg Oral Given 10/29/24 1116)  ketorolac  (TORADOL ) injection 30 mg (30 mg Intramuscular Given 10/29/24 1117)                                    Medical Decision Making Amount and/or Complexity of Data Reviewed Labs: ordered.  Risk OTC drugs. Prescription drug management.   Patient presenting for atraumatic low back pain for the past 2 weeks.  On arrival in the ED, vital signs are normal.  Patient is well-appearing on exam.  She denies any recent red flag symptoms.  She does have tenderness to palpation in the paraspinous areas of her lower back.  She has no focal deficits on exam.  I suspect musculoskeletal etiology.  Multimodal pain control was ordered.  Patient does have a history of pituitary tumor s/p craniotomy.  She is prescribed Solu-Cortef  and is worried about her kidneys.  Will check  lab work.  Patient's lab work shows normal kidney function, normal electrolytes.  Urinalysis does not show evidence of protein or hematuria.  On reassessment, patient is back and has improved.  She was prescribed ongoing multimodal pain control.  She was discharged in stable condition.     Final diagnoses:  Acute bilateral low back pain without sciatica    ED Discharge Orders  Ordered    methocarbamol  (ROBAXIN ) 500 MG tablet  Every 8 hours PRN        10/29/24 1417    meloxicam  (MOBIC ) 15 MG tablet  Daily        10/29/24 1417    lidocaine  (LIDODERM ) 5 %  Every 24 hours        10/29/24 1417               Melvenia Motto, MD 10/29/24 1417  "

## 2024-10-29 NOTE — Discharge Instructions (Addendum)
 Your lab work today was reassuring.  Treat your low back pain with pain medication.  In general, movement is good.  Prescriptions were sent to your pharmacy: -Lidocaine  patches can be switched to daily. -Meloxicam  is an NSAID medication.  Take this in place of over-the-counter NSAIDs. -Methocarbamol  is a muscle relaxer.  Take this as needed.  If you have persistent symptoms, call the telephone number below to follow-up with a spine doctor.  If you have any new or worsening symptoms, return to the emergency department.

## 2024-10-30 ENCOUNTER — Telehealth: Payer: Self-pay | Admitting: *Deleted

## 2024-10-30 NOTE — Progress Notes (Unsigned)
 Complex Care Management Care Guide Note  10/30/2024 Name: JERIS EASTERLY MRN: [id number] DOB: [birth year]  Gwenette LITTIE Ege is a 38 y.o. year old female who is a primary care patient of Baloch, Mahnoor, MD and is actively engaged with the care management team. I reached out to Gwenette LITTIE Ege by phone today to assist with re-scheduling  with the Licensed Clinical Child Psychotherapist.  Follow up plan: Unsuccessful telephone outreach attempt made.   [name]  [facility]  [facility], [facility] Guide  Direct Dial: [phone]  Fax [phone]

## 2024-10-31 ENCOUNTER — Ambulatory Visit (INDEPENDENT_AMBULATORY_CARE_PROVIDER_SITE_OTHER): Admitting: Family Medicine

## 2024-10-31 VITALS — BP 118/85 | HR 76 | Wt 250.0 lb

## 2024-10-31 DIAGNOSIS — M5440 Lumbago with sciatica, unspecified side: Secondary | ICD-10-CM

## 2024-10-31 DIAGNOSIS — L301 Dyshidrosis [pompholyx]: Secondary | ICD-10-CM | POA: Insufficient documentation

## 2024-10-31 MED ORDER — BETAMETHASONE VALERATE 0.1 % EX OINT: 1.0000 | TOPICAL_OINTMENT | Freq: Two times a day (BID) | CUTANEOUS | 0 refills | Status: AC

## 2024-10-31 MED ORDER — LIDOCAINE 5 % EX PTCH: 1.0000 | MEDICATED_PATCH | CUTANEOUS | 0 refills | Status: AC

## 2024-10-31 NOTE — Assessment & Plan Note (Signed)
 Appears to be the most likely diagnosis based on my exam today. Improvement with scabies cream questionably due to occlusive nature of the cream instead of true scabies. - Prescribed betamethasone  ointment BID for 2 weeks on 2 weeks off; she is aware of potential skin lightening and thinning side effects and will let us  know if these occur. - Instructed to apply Vaseline and wear gloves at night.

## 2024-10-31 NOTE — Patient Instructions (Signed)
 I have sent in lidocaine  patches and referral to PT for your back. Please see attached exercises as well. DO NOT do these if pain is too great. Seek care if worsening.  I have filled out your work form.  I sent in steroid cream for your hands. DO NOT use this with triamcinolone  recently prescribed. Use for 2 weeks at a time and STOP for 2 weeks to allow your hands to recover. Let me know if not improving.  Please let me know if you have any other questions.  Dr. Tharon

## 2024-10-31 NOTE — Progress Notes (Signed)
" ° °  SUBJECTIVE:   CHIEF COMPLAINT / HPI:  Discussed the use of AI scribe software for clinical note transcription with the patient, who gave verbal consent to proceed.  History of Present Illness Virginia Cardenas is a 39 year old female who presents with back pain.  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 MUSCULOSKELETAL: Normal sensation and strength in legs, tenderness in bilateral back over paraspinal muscles NEUROLOGICAL: Cranial nerves grossly intact, moves all other extremities without gross motor or sensory deficit. Gait normal. SKIN: Eczematous plaques with dry skin over bilateral hands, scarring noted of bilateral forearms.       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 - Prescribed additional lidocaine  patches. - Referred to physical  therapy for back strengthening. - Provided back stretching exercises for home. - Advised on proper lifting techniques. Dyshidrotic eczema Appears to be the most likely diagnosis based on my exam today. Improvement with scabies cream questionably due to occlusive nature of the cream instead of true scabies. - Prescribed betamethasone  ointment BID for 2 weeks on 2 weeks off; she is aware of potential skin lightening and thinning side effects and will let us  know if these occur. - Instructed to apply Vaseline and wear gloves at night.  Stuart Redo, MD [facility]  "

## 2024-11-02 NOTE — Progress Notes (Signed)
 Complex Care Management Care Guide Note  11/02/2024 Name: Virginia Cardenas MRN: [id number] DOB: 1986-04-11  Virginia Cardenas is a 39 y.o. year old female who is a primary care patient of Baloch, Mahnoor, MD and is actively engaged with the care management team. I reached out to Virginia Cardenas by phone today to assist with re-scheduling  with the Licensed Clinical Child Psychotherapist.  Follow up plan: Telephone appointment with complex care management team member scheduled for:  11/09/24  [name]  [facility] Health  [facility], [facility] Guide  Direct Dial: [phone]  Fax [phone]

## 2024-11-07 ENCOUNTER — Telehealth: Payer: Self-pay

## 2024-11-07 NOTE — Telephone Encounter (Signed)
 Pharmacy Patient Advocate Encounter   Received notification from [facility] KEY that prior authorization for IVERMECTIN  is required/requested.   Insurance verification completed.   The patient is insured through [employer].   Per test claim: PA required; PA started via CoverMyMeds. KEY [id number] . Waiting for clinical questions to populate.

## 2024-11-08 ENCOUNTER — Telehealth: Payer: Self-pay

## 2024-11-08 NOTE — Telephone Encounter (Signed)
 Pharmacy Patient Advocate Encounter   PA required; PA submitted to above mentioned insurance via Latent Key/confirmation #/EOC [id number]. Status is pending

## 2024-11-08 NOTE — Telephone Encounter (Signed)
 Pharmacy Patient Advocate Encounter   Received notification from [employer] KEY that prior authorization for LIDOCAINE  5% PATCHES is required/requested.   Insurance verification completed.   The patient is insured through [employer].   Per test claim: PA required; PA submitted to above mentioned insurance via Latent Key/confirmation #/EOC [id number]. Status is pending

## 2024-11-09 ENCOUNTER — Telehealth: Admitting: *Deleted

## 2024-11-09 ENCOUNTER — Telehealth: Payer: Self-pay | Admitting: Licensed Clinical Social Worker

## 2024-11-09 ENCOUNTER — Encounter: Payer: Self-pay | Admitting: Licensed Clinical Social Worker

## 2024-11-09 NOTE — Patient Instructions (Signed)
 Marcelyn L Baena - I am sorry I was unable to reach you today for our scheduled appointment. I work with Lonnie, Mahnoor, MD and am calling to support your healthcare needs. Please contact me at [phone] at your earliest convenience. I look forward to speaking with you soon.   Thank you,  Cena Ligas, LCSW Clinical Social Worker [facility]

## 2024-11-12 NOTE — Telephone Encounter (Signed)
 Pharmacy Patient Advocate Encounter  Received notification from [employer] that Prior Authorization for IVERMECTIN  has been APPROVED from 11/08/24 to 12/08/24   PA #/Case ID/Reference #: [id number]

## 2024-11-12 NOTE — Telephone Encounter (Signed)
 Pharmacy Patient Advocate Encounter  Received notification from [employer] that Prior Authorization for LIDOCAINE  % PATCH has been APPROVED from 11/08/24 to 11/08/25   PA #/Case ID/Reference #: [id number]

## 2024-11-15 ENCOUNTER — Telehealth: Payer: Self-pay | Admitting: *Deleted

## 2024-11-15 NOTE — Progress Notes (Signed)
 Complex Care Management Care Guide Note  11/15/2024 Name: [name] MRN: [id number] DOB: 07-01-1986  Virginia Cardenas is a 39 y.o. year old female who is a primary care patient of Baloch, Mahnoor, MD and is actively engaged with the care management team. I reached out to Virginia Cardenas by phone today to assist with re-scheduling  with the Licensed Clinical Child Psychotherapist.  Follow up plan: Unsuccessful telephone outreach attempt made.   [name]  [facility] Health  Value-Based Care Institute, [facility] Guide  Direct Dial: [phone]  Fax [phone]

## 2024-11-20 NOTE — Progress Notes (Signed)
 Complex Care Management Care Guide Note  11/20/2024 Name: Virginia Cardenas MRN: [id number] DOB: May 22, 1986  Virginia Cardenas is a 39 y.o. year old female who is a primary care patient of Baloch, Mahnoor, MD and is actively engaged with the care management team. I reached out to Virginia Cardenas by phone today to assist with re-scheduling  with the Licensed Clinical Child Psychotherapist.  Follow up plan:  Unsuccessful telephone outreach attempt made.No further outreach attempts will be made at this time. We have been unable to contact the patient to reschedule for complex care management services.  [name]  [facility] Health  Value-Based Care Institute, [facility] Guide  Direct Dial: [phone]  Fax [phone]

## 2025-01-22 ENCOUNTER — Other Ambulatory Visit

## 2025-01-28 ENCOUNTER — Encounter

## 2025-01-28 ENCOUNTER — Ambulatory Visit: Admitting: Internal Medicine
